# Patient Record
Sex: Female | Born: 1950
Health system: Southern US, Community
[De-identification: ages and names within clinical notes are randomized; demographics above are authoritative.]

## PROBLEM LIST (undated history)

## (undated) DIAGNOSIS — N186 End stage renal disease: Secondary | ICD-10-CM

## (undated) DIAGNOSIS — G4730 Sleep apnea, unspecified: Secondary | ICD-10-CM

## (undated) DIAGNOSIS — E119 Type 2 diabetes mellitus without complications: Secondary | ICD-10-CM

## (undated) DIAGNOSIS — N83209 Unspecified ovarian cyst, unspecified side: Secondary | ICD-10-CM

## (undated) DIAGNOSIS — Z87442 Personal history of urinary calculi: Secondary | ICD-10-CM

## (undated) DIAGNOSIS — N184 Chronic kidney disease, stage 4 (severe): Secondary | ICD-10-CM

## (undated) DIAGNOSIS — I499 Cardiac arrhythmia, unspecified: Secondary | ICD-10-CM

## (undated) DIAGNOSIS — Z992 Dependence on renal dialysis: Secondary | ICD-10-CM

## (undated) DIAGNOSIS — D649 Anemia, unspecified: Secondary | ICD-10-CM

## (undated) DIAGNOSIS — L409 Psoriasis, unspecified: Secondary | ICD-10-CM

## (undated) DIAGNOSIS — I517 Cardiomegaly: Secondary | ICD-10-CM

## (undated) DIAGNOSIS — R011 Cardiac murmur, unspecified: Secondary | ICD-10-CM

## (undated) DIAGNOSIS — M1990 Unspecified osteoarthritis, unspecified site: Secondary | ICD-10-CM

## (undated) DIAGNOSIS — J45909 Unspecified asthma, uncomplicated: Secondary | ICD-10-CM

## (undated) DIAGNOSIS — J189 Pneumonia, unspecified organism: Secondary | ICD-10-CM

## (undated) DIAGNOSIS — Z8679 Personal history of other diseases of the circulatory system: Secondary | ICD-10-CM

## (undated) DIAGNOSIS — K649 Unspecified hemorrhoids: Secondary | ICD-10-CM

## (undated) DIAGNOSIS — T8859XA Other complications of anesthesia, initial encounter: Secondary | ICD-10-CM

## (undated) DIAGNOSIS — R7303 Prediabetes: Secondary | ICD-10-CM

## (undated) DIAGNOSIS — I10 Essential (primary) hypertension: Secondary | ICD-10-CM

## (undated) DIAGNOSIS — R001 Bradycardia, unspecified: Secondary | ICD-10-CM

## (undated) HISTORY — DX: Unspecified ovarian cyst, unspecified side: N83.209

## (undated) HISTORY — DX: Personal history of other diseases of the circulatory system: Z86.79

## (undated) HISTORY — DX: Chronic kidney disease, stage 4 (severe): N18.4

## (undated) HISTORY — PX: JOINT REPLACEMENT: SHX530

## (undated) HISTORY — DX: Cardiac murmur, unspecified: R01.1

## (undated) HISTORY — DX: Unspecified asthma, uncomplicated: J45.909

## (undated) HISTORY — PX: BUNIONECTOMY WITH HAMMERTOE RECONSTRUCTION: SHX5600

## (undated) HISTORY — DX: Unspecified hemorrhoids: K64.9

## (undated) HISTORY — PX: DILATION AND CURETTAGE OF UTERUS: SHX78

---

## 1983-12-23 DIAGNOSIS — N83209 Unspecified ovarian cyst, unspecified side: Secondary | ICD-10-CM

## 1983-12-23 HISTORY — PX: LAPAROSCOPIC OVARIAN CYSTECTOMY: SHX6248

## 1983-12-23 HISTORY — DX: Unspecified ovarian cyst, unspecified side: N83.209

## 2004-12-22 HISTORY — PX: ROTATOR CUFF REPAIR: SHX139

## 2005-12-22 HISTORY — PX: LAPAROSCOPIC GASTRIC BYPASS: SUR771

## 2012-11-10 DIAGNOSIS — N952 Postmenopausal atrophic vaginitis: Secondary | ICD-10-CM | POA: Insufficient documentation

## 2014-11-09 ENCOUNTER — Other Ambulatory Visit: Payer: Self-pay

## 2014-11-09 DIAGNOSIS — Z1231 Encounter for screening mammogram for malignant neoplasm of breast: Secondary | ICD-10-CM

## 2014-11-23 ENCOUNTER — Ambulatory Visit
Admission: RE | Admit: 2014-11-23 | Discharge: 2014-11-23 | Disposition: A | Discharge status: Home / Self Care | Payer: Federal, State, Local not specified - PPO | Source: Ambulatory Visit

## 2014-11-23 DIAGNOSIS — Z1231 Encounter for screening mammogram for malignant neoplasm of breast: Secondary | ICD-10-CM

## 2014-11-30 ENCOUNTER — Ambulatory Visit (INDEPENDENT_AMBULATORY_CARE_PROVIDER_SITE_OTHER): Payer: Federal, State, Local not specified - PPO | Admitting: Nurse Practitioner

## 2014-11-30 ENCOUNTER — Encounter: Payer: Self-pay | Admitting: Nurse Practitioner

## 2014-11-30 ENCOUNTER — Telehealth: Payer: Self-pay | Admitting: Nurse Practitioner

## 2014-11-30 VITALS — BP 120/64 | HR 56 | Ht 62.0 in | Wt 227.0 lb

## 2014-11-30 DIAGNOSIS — Z1211 Encounter for screening for malignant neoplasm of colon: Secondary | ICD-10-CM

## 2014-11-30 DIAGNOSIS — S4992XA Unspecified injury of left shoulder and upper arm, initial encounter: Secondary | ICD-10-CM

## 2014-11-30 DIAGNOSIS — N950 Postmenopausal bleeding: Secondary | ICD-10-CM

## 2014-11-30 DIAGNOSIS — Z01419 Encounter for gynecological examination (general) (routine) without abnormal findings: Secondary | ICD-10-CM

## 2014-11-30 DIAGNOSIS — Z0000 Encounter for general adult medical examination without abnormal findings: Secondary | ICD-10-CM

## 2014-11-30 DIAGNOSIS — R140 Abdominal distension (gaseous): Secondary | ICD-10-CM

## 2014-11-30 NOTE — Telephone Encounter (Signed)
Left message for patient to call back  

## 2014-11-30 NOTE — Telephone Encounter (Signed)
Per conversation with Mrs Colleen Gray: Patient fell at the gym and was injured. Relayed this information to [place] at [employer]. Patient is scheduled Monday, Dec 14 @ [facility] wth Dr Earlie Server.

## 2014-11-30 NOTE — Telephone Encounter (Signed)
Raliegh Ip would like to know if this is a workers comp injury

## 2014-11-30 NOTE — Progress Notes (Signed)
Patient ID: Colleen Gray, female   DOB: [birth year], 63 y.o.   MRN: [id number] 63 y.o. [id number] Widowed African American Fe here for annual exam. Menopausal since 2009. She had irregular bleeding on and off about 6 months prior to menopause.   Rare vaso symptoms. On November 18 th had a clot of blood from the vagina that came without straining.  Then had light menstrual flow for 2 days. Has abdominal cramps like a menstrual cycle.  No vaginal bleeding since.  She is concerned about OV cancer as she is having some GI symptoms of bloating and increase in weight through her abdomen.  She is due for a repeat colonoscopy, but will not see her new PCP until February.  She does from time to time have tinges of blood on the stool.  Thinks this is related to straining.   Husband died 2.75 years ago with complications of esophageal rupture. She does have a significant other.  Previously Chief Strategy Officer and retired 2008.   Moved from [place] in September.     Patient's last menstrual period was 12/23/2007 (approximate).          Sexually active: Yes.    The current method of family planning is post menopausal status.    Exercising: No.  The patient does not participate in regular exercise at present. Smoker:  no  Health Maintenance: Pap:  11/21/13, Negative with neg HR HPV, no history of abnormal MMG:  11/23/14 at [facility], no results at time of visit as they are waiting for films from imaging center in [place]. Colonoscopy:  2010, normal, repeat in 5 years due to family history of colon cancer BMD:   10/25/2012,  T Score:  3.5S/1.6R/1.6L TDaP:  unsure Labs: PCP 10/05/2014 - normal   reports that she has never smoked. She has never used smokeless tobacco. She reports that she does not drink alcohol or use illicit drugs.  Past Medical History  Diagnosis Date  . Heart murmur   . Asthma   . History of rheumatic fever     with a heart murmer  . Ovarian cyst 1985    removed  . Hemorrhoid      Past Surgical History  Procedure Laterality Date  . Bunionectomy Bilateral 2009    with hammertoe surgery  . Rotator cuff repair Right 2006  . Laparoscopic gastric bypass  2007  . Laparoscopic ovarian cystectomy Right 1985  . Dilation and curettage of uterus  early 1980's    for missed AB    Current Outpatient Prescriptions  Medication Sig Dispense Refill  . albuterol (PROAIR HFA) 108 (90 BASE) MCG/ACT inhaler Inhale 2 puffs into the lungs every 4 (four) hours as needed.    Marland Kitchen aspirin 81 MG tablet Take 81 mg by mouth 3 (three) times a week.    . Calcium-Magnesium-Vitamin D (CALCIUM 1200+D3 PO) Take 1 tablet by mouth daily.    . Diclofenac Sodium (PENNSAID) 2 % SOLN Place 1 spray onto the skin as needed.    . EUFLEXXA 20 MG/2ML SOSY 20 mg every 6 (six) months.    Marland Kitchen lisinopril (PRINIVIL,ZESTRIL) 20 MG tablet Take 20 mg by mouth daily.  1  . Lorcaserin HCl (BELVIQ) 10 MG TABS Take 1 tablet by mouth 2 (two) times daily.    . vitamin B-12 (CYANOCOBALAMIN) 100 MCG tablet Take 50 mcg by mouth daily.     No current facility-administered medications for this visit.    Family History  Problem Relation Age of Onset  .  Colon cancer Mother 13  . Diabetes Mother   . Hypertension Mother   . Diabetes Father   . Hypertension Father   . Colon cancer Maternal Grandmother 96  . Diabetes Paternal Grandmother   . Diabetes Paternal Grandfather     ROS:  Pertinent items are noted in HPI.  Otherwise, a comprehensive ROS was negative.  Exam:   BP 120/64 mmHg  Pulse 56  Ht 5\' 2"  ([id number] m)  Wt 227 lb (102.967 kg)  BMI 41.51 kg/m2  LMP 12/23/2007 (Approximate) Height: 5\' 2"  (157.5 cm)  Ht Readings from Last 3 Encounters:  11/30/14 5\' 2"  (1.575 m)    General appearance: alert, cooperative and appears stated age Head: Normocephalic, without obvious abnormality, atraumatic Neck: no adenopathy, supple, symmetrical, trachea midline and thyroid normal to inspection and palpation.   She has an  abnormal left clavicle with a healing fracture.  I believe this came from a previous fall in January at the gym and now calcifications.  She exhibits with a left shoulder drop. Lungs: clear to auscultation bilaterally Breasts: normal appearance, no masses or tenderness Heart: regular rate and rhythm Abdomen: soft, non-tender; no masses,  no organomegaly Extremities: extremities normal, atraumatic, no cyanosis or edema Skin: Skin color, texture, turgor normal. No rashes or lesions Lymph nodes: Cervical, supraclavicular, and axillary nodes normal. No abnormal inguinal nodes palpated Neurologic: Grossly normal   Pelvic: External genitalia:  no lesions              Urethra:  normal appearing urethra with no masses, tenderness or lesions              Bartholin's and Skene's: normal                 Vagina: normal appearing vagina with normal color and discharge, no lesions, no bleeding              Cervix: anteverted              Pap taken: Yes.   Bimanual Exam:  Uterus:  normal size, contour, position, consistency, mobility, non-tender              Adnexa: no mass, fullness, tenderness               Rectovaginal: Confirms               Anus:  normal sphincter tone, no lesions  A:  Well Woman with normal exam  Postmenopausal with PMB 11/08/14 along with abdominal bloating  History of Rheumatic heart disease  S/P right OV Cystectomy 1985  History of hemorrhoid with recent blood on stool   [facility] of colon cancer with Mother and MGM  S/P Gastric bypass 2007 - currently on Belviq  P:   Reviewed health and wellness pertinent to exam  Pap smear taken today  Mammogram is due 12/16  Will follow with CA 125 and pap  Will get PUS, endo biopsy to evaluate PMB  Will get GI referral for blood on stool, routine screening is due and [facility] of colon cancer  Will get Ortho consult for follow up on left clavicle injury  Counseled on breast self exam, mammography screening, osteoporosis, adequate intake of  calcium and vitamin D, diet and exercise, Kegel's exercises return annually or prn  An After Visit Summary was printed and given to the patient.

## 2014-11-30 NOTE — Patient Instructions (Signed)

## 2014-11-30 NOTE — Telephone Encounter (Signed)
-----   Message from Elenore Paddy sent at 11/30/2014  2:13 PM EST ----- Contact: [phone] Cindy with [employer] Ip left voicemail during lunch hours asking for call back regarding Dwain Sarna.  Best: [phone]  Thanks  becky

## 2014-12-01 LAB — CA 125: CA 125: 7 U/mL (ref <35)

## 2014-12-02 LAB — IPS PAP TEST WITH HPV

## 2014-12-03 NOTE — Progress Notes (Signed)
Encounter reviewed by Dr. Brook Silva.  

## 2014-12-04 ENCOUNTER — Other Ambulatory Visit: Payer: Self-pay | Admitting: Orthopedic Surgery

## 2014-12-04 DIAGNOSIS — S43202D Unspecified subluxation of left sternoclavicular joint, subsequent encounter: Secondary | ICD-10-CM

## 2014-12-05 ENCOUNTER — Other Ambulatory Visit: Payer: Self-pay

## 2014-12-05 DIAGNOSIS — N950 Postmenopausal bleeding: Secondary | ICD-10-CM

## 2014-12-05 DIAGNOSIS — R140 Abdominal distension (gaseous): Secondary | ICD-10-CM

## 2014-12-05 NOTE — Telephone Encounter (Signed)
Spoke with patient. Advised that per benefit quote received, she will be responsible for $58.88 when she comes in for PUS. Patient agreeable. Scheduled PUS. Advised patient of 72 hour cancellation policy and [id number] cancellation fee. Patient agreeable.

## 2014-12-07 ENCOUNTER — Ambulatory Visit
Admission: RE | Admit: 2014-12-07 | Discharge: 2014-12-07 | Disposition: A | Discharge status: Home / Self Care | Payer: Federal, State, Local not specified - PPO | Source: Ambulatory Visit | Attending: Orthopedic Surgery | Admitting: Orthopedic Surgery

## 2014-12-07 DIAGNOSIS — S43202D Unspecified subluxation of left sternoclavicular joint, subsequent encounter: Secondary | ICD-10-CM

## 2014-12-21 ENCOUNTER — Ambulatory Visit (INDEPENDENT_AMBULATORY_CARE_PROVIDER_SITE_OTHER): Payer: Federal, State, Local not specified - PPO | Admitting: Obstetrics and Gynecology

## 2014-12-21 ENCOUNTER — Encounter: Payer: Self-pay | Admitting: Obstetrics and Gynecology

## 2014-12-21 ENCOUNTER — Ambulatory Visit (INDEPENDENT_AMBULATORY_CARE_PROVIDER_SITE_OTHER): Payer: Federal, State, Local not specified - PPO

## 2014-12-21 VITALS — BP 122/74 | HR 60 | Ht 62.0 in | Wt 226.0 lb

## 2014-12-21 DIAGNOSIS — N950 Postmenopausal bleeding: Secondary | ICD-10-CM

## 2014-12-21 DIAGNOSIS — R140 Abdominal distension (gaseous): Secondary | ICD-10-CM

## 2014-12-21 DIAGNOSIS — D259 Leiomyoma of uterus, unspecified: Secondary | ICD-10-CM

## 2014-12-21 NOTE — Progress Notes (Signed)
Subjective  Patient is here today for pelvic ultrasound and EMB for postmenopausal bleeding.  LMP 2009.  Never took HRT.  Had bleeding in November 18 from vagina.   Also having evaluation for rectal bleeding.  Will be seeing GI in January 04, 2015 - Dr. Collene Mares.   Objective  See report and images below.  Uterus with multiple small fibroids.  EMS 3.12 mm Ovaries normal.   No free fluid.     Attempted EMB. Consent for procedure.  Speculum placed.  No cervical lesions.  Patient intolerant of speculum. Inable to place tenaculum or give paracervical block.   Bimanual - uterus small and nontender.  Exam limited by [facility]. No adnexal masses.  Tight hymen.   Assessment  Multifibroid uterus.  Postmenopausal bleeding. Unable to do office EMB.  Intolerance to speculum exam.   Plan   Patient will inform office if she has recurrent postmenopausal bleeding.  If that occurs, hospital dilation and curettage.  25 minutes face to face time of which over 50% was spent in counseling.   After visit summary to patient.

## 2014-12-22 HISTORY — PX: COLONOSCOPY: SHX174

## 2015-02-09 ENCOUNTER — Emergency Department (HOSPITAL_COMMUNITY): Payer: Federal, State, Local not specified - PPO

## 2015-02-09 ENCOUNTER — Emergency Department (HOSPITAL_COMMUNITY)
Admission: EM | Admit: 2015-02-09 | Discharge: 2015-02-09 | Disposition: A | Discharge status: Home / Self Care | Payer: Federal, State, Local not specified - PPO | Attending: Emergency Medicine | Admitting: Emergency Medicine

## 2015-02-09 ENCOUNTER — Encounter (HOSPITAL_COMMUNITY): Payer: Self-pay | Admitting: *Deleted

## 2015-02-09 DIAGNOSIS — R011 Cardiac murmur, unspecified: Secondary | ICD-10-CM | POA: Insufficient documentation

## 2015-02-09 DIAGNOSIS — Z7982 Long term (current) use of aspirin: Secondary | ICD-10-CM | POA: Diagnosis not present

## 2015-02-09 DIAGNOSIS — Z791 Long term (current) use of non-steroidal anti-inflammatories (NSAID): Secondary | ICD-10-CM | POA: Diagnosis not present

## 2015-02-09 DIAGNOSIS — R001 Bradycardia, unspecified: Secondary | ICD-10-CM | POA: Insufficient documentation

## 2015-02-09 DIAGNOSIS — Z79899 Other long term (current) drug therapy: Secondary | ICD-10-CM | POA: Insufficient documentation

## 2015-02-09 DIAGNOSIS — J45909 Unspecified asthma, uncomplicated: Secondary | ICD-10-CM | POA: Insufficient documentation

## 2015-02-09 DIAGNOSIS — Z8742 Personal history of other diseases of the female genital tract: Secondary | ICD-10-CM | POA: Diagnosis not present

## 2015-02-09 DIAGNOSIS — Z8619 Personal history of other infectious and parasitic diseases: Secondary | ICD-10-CM | POA: Diagnosis not present

## 2015-02-09 LAB — BASIC METABOLIC PANEL
Anion gap: 9 (ref 5–15)
BUN: 18 mg/dL (ref 6–23)
CO2: 20 mmol/L (ref 19–32)
Calcium: 9.3 mg/dL (ref 8.4–10.5)
Chloride: 114 mmol/L — ABNORMAL HIGH (ref 96–112)
Creatinine, Ser: 1.43 mg/dL — ABNORMAL HIGH (ref 0.50–1.10)
GFR calc Af Amer: 44 mL/min — ABNORMAL LOW (ref >90)
GFR calc non Af Amer: 38 mL/min — ABNORMAL LOW (ref >90)
Glucose, Bld: 58 mg/dL — ABNORMAL LOW (ref 70–99)
Potassium: 3.6 mmol/L (ref 3.5–5.1)
Sodium: 143 mmol/L (ref 135–145)

## 2015-02-09 LAB — CBC
HCT: 37.1 % (ref 36.0–46.0)
Hemoglobin: 12.1 g/dL (ref 12.0–15.0)
MCH: 27.9 pg (ref 26.0–34.0)
MCHC: 32.6 g/dL (ref 30.0–36.0)
MCV: 85.7 fL (ref 78.0–100.0)
Platelets: 252 10*3/uL (ref 150–400)
RBC: 4.33 MIL/uL (ref 3.87–5.11)
RDW: 14.6 % (ref 11.5–15.5)
WBC: 4.8 10*3/uL (ref 4.0–10.5)

## 2015-02-09 LAB — TROPONIN I: Troponin I: <0.03 ng/mL (ref <0.031)

## 2015-02-09 NOTE — ED Notes (Signed)
Dr. Collene Mares called and states that pt is being sent for bradycardia and that Dr. Terrence Dupont will admit if necessary.

## 2015-02-09 NOTE — ED Notes (Signed)
The pt went to get a colonoscopy this am and her heart rate was too low tio have the procedure.  It was a routine exam 5 yr.  The pt has no chest pain.   She feel fine otherwise.  Her heart rate was  In the 40s.

## 2015-02-09 NOTE — ED Provider Notes (Signed)
CSN: [id number]     Arrival date & time 02/09/15  1602 History   First MD Initiated Contact with Patient 02/09/15 2004     Chief Complaint  Patient presents with  . Bradycardia     (Consider location/radiation/quality/duration/timing/severity/associated sxs/prior Treatment) Patient is a 64 y.o. female presenting with general illness.  Illness Quality:  Bradycardia, asymptomatic Severity:  Moderate Onset quality:  Unable to specify Timing:  Constant Progression:  Resolved Chronicity:  New Context:  Noted while being preopped for colonoscopy this am Relieved by:  Nothing Worsened by:  Nothing Associated symptoms: no abdominal pain, no chest pain, no cough, no loss of consciousness, no nausea, no shortness of breath and no vomiting     Past Medical History  Diagnosis Date  . Heart murmur   . Asthma   . History of rheumatic fever     with a heart murmer  . Ovarian cyst 1985    removed  . Hemorrhoid    Past Surgical History  Procedure Laterality Date  . Bunionectomy Bilateral 2009    with hammertoe surgery  . Rotator cuff repair Right 2006  . Laparoscopic gastric bypass  2007  . Laparoscopic ovarian cystectomy Right 1985  . Dilation and curettage of uterus  early 1980's    for missed AB   Family History  Problem Relation Age of Onset  . Colon cancer Mother 21  . Diabetes Mother   . Hypertension Mother   . Diabetes Father   . Hypertension Father   . Colon cancer Maternal Grandmother 96  . Diabetes Paternal Grandmother   . Diabetes Paternal Grandfather    History  Substance Use Topics  . Smoking status: Never Smoker   . Smokeless tobacco: Never Used  . Alcohol Use: No   OB History    Gravida Para Term Preterm AB TAB SAB Ectopic Multiple Living   1    1  1    0     Review of Systems  Respiratory: Negative for cough and shortness of breath.   Cardiovascular: Negative for chest pain.  Gastrointestinal: Negative for nausea, vomiting and abdominal pain.   Neurological: Negative for loss of consciousness.  All other systems reviewed and are negative.     Allergies  Penicillins  Home Medications   Prior to Admission medications   Medication Sig Start Date End Date Taking? Authorizing Provider  albuterol (PROAIR HFA) 108 (90 BASE) MCG/ACT inhaler Inhale 2 puffs into the lungs every 4 (four) hours as needed for shortness of breath.  10/22/13  Yes Historical Provider, MD  Alpha-Lipoic Acid 200 MG TABS Take 1 tablet by mouth daily.   Yes Historical Provider, MD  aspirin 81 MG tablet Take 81 mg by mouth 3 (three) times a week. No specific days   Yes Historical Provider, MD  Calcium-Magnesium-Vitamin D (CALCIUM 1200+D3 PO) Take 1 tablet by mouth daily.   Yes Historical Provider, MD  Diclofenac Sodium (PENNSAID) 2 % SOLN Place 1 spray onto the skin daily as needed. For the shoulder   Yes Historical Provider, MD  EUFLEXXA 20 MG/2ML SOSY 20 mg every 6 (six) months. Last dose was in December 2015 11/20/14  Yes Historical Provider, MD  lisinopril (PRINIVIL,ZESTRIL) 20 MG tablet Take 20 mg by mouth daily. 11/20/14  Yes Historical Provider, MD  Lorcaserin HCl (BELVIQ) 10 MG TABS Take 1 tablet by mouth 2 (two) times daily.   Yes Historical Provider, MD  Multiple Vitamin (MULTIVITAMIN WITH MINERALS) TABS tablet Take 1 tablet by mouth  daily.   Yes Historical Provider, MD  omega-3 acid ethyl esters (LOVAZA) 1 G capsule Take 1 g by mouth 2 (two) times daily.   Yes Historical Provider, MD  vitamin B-12 (CYANOCOBALAMIN) 100 MCG tablet Take 50 mcg by mouth daily.   Yes Historical Provider, MD  vitamin C (ASCORBIC ACID) 500 MG tablet Take 500 mg by mouth daily.   Yes Historical Provider, MD   BP 125/62 mmHg  Pulse 69  Temp(Src) 97.5 F (36.4 C) (Oral)  Resp 17  SpO2 97%  LMP 12/23/2007 (Approximate) Physical Exam  Constitutional: She is oriented to person, place, and time. She appears well-developed and well-nourished.  HENT:  Head: Normocephalic and  atraumatic.  Right Ear: External ear normal.  Left Ear: External ear normal.  Eyes: Conjunctivae and EOM are normal. Pupils are equal, round, and reactive to light.  Neck: Normal range of motion. Neck supple.  Cardiovascular: Normal rate, regular rhythm, normal heart sounds and intact distal pulses.   Pulmonary/Chest: Effort normal and breath sounds normal.  Abdominal: Soft. Bowel sounds are normal. There is no tenderness.  Musculoskeletal: Normal range of motion.  Neurological: She is alert and oriented to person, place, and time.  Skin: Skin is warm and dry.  Vitals reviewed.   ED Course  Procedures (including critical care time) Labs Review Labs Reviewed  BASIC METABOLIC PANEL - Abnormal; Notable for the following:    Chloride 114 (*)    Glucose, Bld 58 (*)    Creatinine, Ser 1.43 (*)    GFR calc non Af Amer 38 (*)    GFR calc Af Amer 44 (*)    All other components within normal limits  CBC  TROPONIN I    Imaging Review Dg Chest 2 View  02/09/2015   CLINICAL DATA:  The pt went to get a colonoscopy this am and her heart rate was too low to have the procedure. It was a routine exam 5 yr. The pt has no chest pain. She feel fine otherwise. Her heart rate was In the 40s. Hx heart murmur and enlarged heart when she was 64 y.o. Pt is not having any other symptoms  EXAM: CHEST  2 VIEW  COMPARISON:  Chest CT, 02/07/2014  FINDINGS: Cardiac silhouette is mildly enlarged.  Normal mediastinal and hilar contours.  Clear lungs.  No pleural effusion or pneumothorax.  Bony thorax is demineralized. There arthropathic changes of both shoulders.  IMPRESSION: No acute cardiopulmonary disease.   Electronically Signed   By: Lajean Manes M.D.   On: 02/09/2015 18:17     EKG Interpretation None      MDM   Final diagnoses:  None    64 y.o. female with pertinent PMH of rheumatic fever presents with asymptomatic bradycardia noted just prior to colonoscopy today. Patient denies chest pain, near  syncope, lightheadedness, shortness of breath, other symptoms. On arrival today vitals signs and physical exam as above. Heart rate improved. Unknown etiology, however feel patient stable for outpatient cardiology evaluation. Discharged home in stable condition..    I have reviewed all laboratory and imaging studies if ordered as above  1. Bradycardia         Colleen Freiberg, MD 02/09/15 2019

## 2015-02-09 NOTE — Discharge Instructions (Signed)
Bradycardia °Bradycardia is a term for a heart rate (pulse) that, in adults, is slower than 60 beats per minute. A normal rate is 60 to 100 beats per minute. A heart rate below 60 beats per minute may be normal for some adults with healthy hearts. If the rate is too slow, the heart may have trouble pumping the volume of blood the body needs. If the heart rate gets too low, blood flow to the brain may be decreased and may make you feel lightheaded, dizzy, or faint. °The heart has a natural pacemaker in the top of the heart called the SA node (sinoatrial or sinus node). This pacemaker sends out regular electrical signals to the muscle of the heart, telling the heart muscle when to beat (contract). The electrical signal travels from the upper parts of the heart (atria) through the AV node (atrioventricular node), to the lower chambers of the heart (ventricles). The ventricles squeeze, pumping the blood from your heart to your lungs and to the rest of your body. °CAUSES  °· Problem with the heart's electrical system. °· Problem with the heart's natural pacemaker. °· Heart disease, damage, or infection. °· Medications. °· Problems with minerals and salts (electrolytes). °SYMPTOMS  °· Fainting (syncope). °· Fatigue and weakness. °· Shortness of breath (dyspnea). °· Chest pain (angina). °· Drowsiness. °· Confusion. °DIAGNOSIS  °· An electrocardiogram (ECG) can help your caregiver determine the type of slow heart rate you have. °· If the cause is not seen on an ECG, you may need to wear a heart monitor that records your heart rhythm for several hours or days. °· Blood tests. °TREATMENT  °· Electrolyte supplements. °· Medications. °· Withholding medication which is causing a slow heart rate. °· Pacemaker placement. °SEEK IMMEDIATE MEDICAL CARE IF:  °· You feel lightheaded or faint. °· You develop an irregular heart rate. °· You feel chest pain or have trouble breathing. °MAKE SURE YOU:  °· Understand these  instructions. °· Will watch your condition. °· Will get help right away if you are not doing well or get worse. °Document Released: 08/30/2002 Document Revised: 03/01/2012 Document Reviewed: 03/15/2014 °ExitCare® Patient Information ©2015 [employer]. This information is not intended to replace advice given to you by your health care provider. Make sure you discuss any questions you have with your health care provider. ° °

## 2015-04-06 ENCOUNTER — Telehealth: Payer: Self-pay | Admitting: Nurse Practitioner

## 2015-04-06 NOTE — Telephone Encounter (Signed)
Patient calling requesting a refill on her birth control. Pharmacy on file is correct. °

## 2015-07-20 ENCOUNTER — Encounter: Payer: Self-pay | Admitting: Nurse Practitioner

## 2015-08-31 ENCOUNTER — Encounter: Payer: Self-pay | Admitting: Podiatry

## 2015-08-31 ENCOUNTER — Ambulatory Visit (INDEPENDENT_AMBULATORY_CARE_PROVIDER_SITE_OTHER): Payer: Federal, State, Local not specified - PPO | Admitting: Podiatry

## 2015-08-31 VITALS — BP 124/73 | HR 62 | Resp 16

## 2015-08-31 DIAGNOSIS — M2042 Other hammer toe(s) (acquired), left foot: Secondary | ICD-10-CM | POA: Diagnosis not present

## 2015-08-31 DIAGNOSIS — M79672 Pain in left foot: Secondary | ICD-10-CM

## 2015-08-31 DIAGNOSIS — M21962 Unspecified acquired deformity of left lower leg: Secondary | ICD-10-CM

## 2015-08-31 NOTE — Progress Notes (Signed)
   Subjective:    Patient ID: Colleen Gray, female    DOB: April 04, 1951, 64 y.o.   MRN: [id number]  HPI 64 year old female presents the office today for surgical consultation as a referral from Dr. Gershon Mussel. She states that she previously had first MTPJ fusion of the left foot approximate 9 years ago. She states that recently she has redness or second, third, fourth toes drifting outwards and become contracted. She has pain in the toes because pressure in shoe gear. She describes as a throbbing ongoing pain. She has tried multiple conservative treatments including shoe gear modifications, offloading, padding without much relief in symptoms. At this time she is requesting surgical intervention.  Review of Systems  Musculoskeletal: Positive for arthralgias and gait problem.  All other systems reviewed and are negative.      Objective:   Physical Exam AAO 3, NAD DP/PT pulses palpable, CRT less than 3 seconds Protective sensation intact with Derrel Nip monofilament His been previous fusion first MTPJ of the left foot which appears to be stable. The first metatarsal hallux in a rectus position. The lesser digits are laterally deviated and there are hammertoe contractures. There is tenderness overlying the MPJs lesser digits as well as the dorsal PIPJ's. There is no overlying edema, erythema, increase in warmth. There is a decrease in medial arch height upon weightbearing and mild discomfort on the medial arch. There is no other areas of pinpoint bony tenderness or pain the vibratory sensation. MMT 5/5, ROM WNL except for otherwise stated.  No open lesions or pre-ulcerative lesions. No pain with calf compression, swelling, warmth, erythema.      Assessment & Plan:   64 year old female with lesser digital deformities left foot. -She did not bring x-rays with her from Dr. Gershon Mussel and she did not want new fractures performed. I will get a copy of the x-rays and review them and give her a call once I  review them. -Based on the report from Dr. Gershon Mussel there is degeneration of the metatarsal heads. Due to that and the digital deformities would likely benefit from a pain metatarsal head resection and hammertoe repair however will await the results of the x-rays before deciding on definitive treatment. -She will likely benefit from orthotics to help her medial arch however we will hold off on that given her upcoming surgery most likely. -Follow-up after receiving the x-rays or sooner if any problems are to arise. In the meantime I encouraged her to call the office with any questions, concerns, change in symptoms.  Celesta Gentile, DPM

## 2015-09-19 ENCOUNTER — Other Ambulatory Visit: Payer: Self-pay

## 2015-09-19 ENCOUNTER — Telehealth: Payer: Self-pay | Admitting: Nurse Practitioner

## 2015-09-19 DIAGNOSIS — E2839 Other primary ovarian failure: Secondary | ICD-10-CM

## 2015-09-19 DIAGNOSIS — Z780 Asymptomatic menopausal state: Secondary | ICD-10-CM

## 2015-09-19 NOTE — Telephone Encounter (Signed)
Patient calling requesting an order be sent to [facility] for her bone density. She said, "I am due after 11/26/15 and would like to have that at the same time as my mammogram."

## 2015-09-19 NOTE — Telephone Encounter (Signed)
Order for BMD sent to [facility]. Left message for patient to call [place] at [phone].

## 2015-09-20 ENCOUNTER — Other Ambulatory Visit: Payer: Self-pay

## 2015-09-20 DIAGNOSIS — E2839 Other primary ovarian failure: Secondary | ICD-10-CM

## 2015-09-20 DIAGNOSIS — Z780 Asymptomatic menopausal state: Secondary | ICD-10-CM

## 2015-09-20 NOTE — Telephone Encounter (Signed)
Spoke with patient. Advised patient order for BMD has been sent to the [facility]. Patient is agreeable and will call to schedule appointment.  Routing to provider for final review. Patient agreeable to disposition. Will close encounter.

## 2015-11-19 ENCOUNTER — Encounter (HOSPITAL_COMMUNITY)
Admission: RE | Admit: 2015-11-19 | Discharge: 2015-11-19 | Disposition: A | Discharge status: Home / Self Care | Payer: Federal, State, Local not specified - PPO | Source: Ambulatory Visit | Attending: Orthopedic Surgery | Admitting: Orthopedic Surgery

## 2015-11-19 ENCOUNTER — Other Ambulatory Visit (HOSPITAL_COMMUNITY): Payer: Self-pay | Admitting: *Deleted

## 2015-11-19 ENCOUNTER — Encounter (HOSPITAL_COMMUNITY): Payer: Self-pay

## 2015-11-19 DIAGNOSIS — Z8719 Personal history of other diseases of the digestive system: Secondary | ICD-10-CM | POA: Diagnosis not present

## 2015-11-19 DIAGNOSIS — G4733 Obstructive sleep apnea (adult) (pediatric): Secondary | ICD-10-CM | POA: Diagnosis not present

## 2015-11-19 DIAGNOSIS — Z9884 Bariatric surgery status: Secondary | ICD-10-CM | POA: Diagnosis not present

## 2015-11-19 DIAGNOSIS — I10 Essential (primary) hypertension: Secondary | ICD-10-CM | POA: Diagnosis not present

## 2015-11-19 DIAGNOSIS — Z880 Allergy status to penicillin: Secondary | ICD-10-CM | POA: Diagnosis not present

## 2015-11-19 DIAGNOSIS — M75122 Complete rotator cuff tear or rupture of left shoulder, not specified as traumatic: Secondary | ICD-10-CM | POA: Diagnosis not present

## 2015-11-19 DIAGNOSIS — J45909 Unspecified asthma, uncomplicated: Secondary | ICD-10-CM | POA: Diagnosis not present

## 2015-11-19 DIAGNOSIS — R011 Cardiac murmur, unspecified: Secondary | ICD-10-CM | POA: Diagnosis not present

## 2015-11-19 DIAGNOSIS — Z7982 Long term (current) use of aspirin: Secondary | ICD-10-CM | POA: Diagnosis not present

## 2015-11-19 DIAGNOSIS — Z79899 Other long term (current) drug therapy: Secondary | ICD-10-CM | POA: Diagnosis not present

## 2015-11-19 DIAGNOSIS — M19012 Primary osteoarthritis, left shoulder: Secondary | ICD-10-CM | POA: Diagnosis not present

## 2015-11-19 DIAGNOSIS — Z6841 Body Mass Index (BMI) 40.0 and over, adult: Secondary | ICD-10-CM | POA: Diagnosis not present

## 2015-11-19 HISTORY — DX: Essential (primary) hypertension: I10

## 2015-11-19 HISTORY — DX: Unspecified osteoarthritis, unspecified site: M19.90

## 2015-11-19 HISTORY — DX: Pneumonia, unspecified organism: J18.9

## 2015-11-19 HISTORY — DX: Anemia, unspecified: D64.9

## 2015-11-19 HISTORY — DX: Cardiomegaly: I51.7

## 2015-11-19 HISTORY — DX: Sleep apnea, unspecified: G47.30

## 2015-11-19 HISTORY — DX: Bradycardia, unspecified: R00.1

## 2015-11-19 LAB — CBC WITH DIFFERENTIAL/PLATELET
Basophils Absolute: 0 K/uL (ref 0.0–0.1)
Basophils Relative: 0 %
Eosinophils Absolute: 0.1 K/uL (ref 0.0–0.7)
Eosinophils Relative: 2 %
HCT: 39.1 % (ref 36.0–46.0)
Hemoglobin: 12.6 g/dL (ref 12.0–15.0)
Lymphocytes Relative: 34 %
Lymphs Abs: 1.6 K/uL (ref 0.7–4.0)
MCH: 29.2 pg (ref 26.0–34.0)
MCHC: 32.2 g/dL (ref 30.0–36.0)
MCV: 90.7 fL (ref 78.0–100.0)
Monocytes Absolute: 0.3 K/uL (ref 0.1–1.0)
Monocytes Relative: 7 %
Neutro Abs: 2.6 K/uL (ref 1.7–7.7)
Neutrophils Relative %: 57 %
Platelets: 223 K/uL (ref 150–400)
RBC: 4.31 MIL/uL (ref 3.87–5.11)
RDW: 14.5 % (ref 11.5–15.5)
WBC: 4.6 K/uL (ref 4.0–10.5)

## 2015-11-19 LAB — COMPREHENSIVE METABOLIC PANEL
ALT: 18 U/L (ref 14–54)
AST: 21 U/L (ref 15–41)
Albumin: 3.6 g/dL (ref 3.5–5.0)
Alkaline Phosphatase: 116 U/L (ref 38–126)
Anion gap: 5 (ref 5–15)
BUN: 26 mg/dL — ABNORMAL HIGH (ref 6–20)
CO2: 26 mmol/L (ref 22–32)
Calcium: 8.8 mg/dL — ABNORMAL LOW (ref 8.9–10.3)
Chloride: 112 mmol/L — ABNORMAL HIGH (ref 101–111)
Creatinine, Ser: 1.5 mg/dL — ABNORMAL HIGH (ref 0.44–1.00)
GFR calc Af Amer: 42 mL/min — ABNORMAL LOW (ref >60)
GFR calc non Af Amer: 36 mL/min — ABNORMAL LOW (ref >60)
Glucose, Bld: 117 mg/dL — ABNORMAL HIGH (ref 65–99)
Potassium: 4.3 mmol/L (ref 3.5–5.1)
Sodium: 143 mmol/L (ref 135–145)
Total Bilirubin: 0.5 mg/dL (ref 0.3–1.2)
Total Protein: 6.3 g/dL — ABNORMAL LOW (ref 6.5–8.1)

## 2015-11-19 LAB — APTT: aPTT: 29 s (ref 24–37)

## 2015-11-19 LAB — SURGICAL PCR SCREEN
MRSA, PCR: NEGATIVE
Staphylococcus aureus: NEGATIVE

## 2015-11-19 LAB — PROTIME-INR
INR: 1.05 (ref 0.00–1.49)
Prothrombin Time: 13.9 seconds (ref 11.6–15.2)

## 2015-11-19 NOTE — Pre-Procedure Instructions (Signed)
Colleen Gray  11/19/2015     Your procedure is scheduled on Thursday, November 22, 2015 at 7:30 AM.   Report to [facility] Entrance "A" Admitting Office at 5:30 AM.   Call this number if you have problems the morning of surgery: [phone]   Any questions prior to day of surgery, please call [phone] between 8 & 4 PM.   Remember:  Do not eat food or drink liquids after midnight Wednesday, 11/21/15.   You may use your Albuterol inhaler if needed the morning of surgery.  Stop Aspirin, Multivitamins, Lovaza, and Herbal Medications as of today.     Do not wear jewelry, make-up or nail polish.  Do not wear lotions, powders, or perfumes.  You may NOT wear deodorant.  Do not shave 48 hours prior to surgery.    Do not bring valuables to the hospital.  [facility] is not responsible for any belongings or valuables.  Contacts, dentures or bridgework may not be worn into surgery.  Leave your suitcase in the car.  After surgery it may be brought to your room.  For patients admitted to the hospital, discharge time will be determined by your treatment team.  Special instructions:  [facility] - Preparing for Surgery  Before surgery, you can play an important role.  Because skin is not sterile, your skin needs to be as free of germs as possible.  You can reduce the number of germs on you skin by washing with CHG (chlorahexidine gluconate) soap before surgery.  CHG is an antiseptic cleaner which kills germs and bonds with the skin to continue killing germs even after washing.  Please DO NOT use if you have an allergy to CHG or antibacterial soaps.  If your skin becomes reddened/irritated stop using the CHG and inform your nurse when you arrive at Short Stay.  Do not shave (including legs and underarms) for at least 48 hours prior to the first CHG shower.  You may shave your face.  Please follow these instructions carefully:   1.  Shower with CHG Soap the night before surgery  and the                                morning of Surgery.  2.  If you choose to wash your hair, wash your hair first as usual with your       normal shampoo.  3.  After you shampoo, rinse your hair and body thoroughly to remove the                      Shampoo.  4.  Use CHG as you would any other liquid soap.  You can apply chg directly       to the skin and wash gently with scrungie or a clean washcloth.  5.  Apply the CHG Soap to your body ONLY FROM THE NECK DOWN.        Do not use on open wounds or open sores.  Avoid contact with your eyes, ears, mouth and genitals (private parts).  Wash genitals (private parts) with your normal soap.  6.  Wash thoroughly, paying special attention to the area where your surgery        will be performed.  7.  Thoroughly rinse your body with warm water from the neck down.  8.  DO NOT shower/wash with your normal soap after using  and rinsing off       the CHG Soap.  9.  Pat yourself dry with a clean towel.            10.  Wear clean pajamas.            11.  Place clean sheets on your bed the night of your first shower and do not        sleep with pets.  Day of Surgery  Do not apply any lotions/deodorants the morning of surgery.  Please wear clean clothes to the hospital.   Please read over the following fact sheets that you were given. Pain Booklet, Coughing and Deep Breathing and Surgical Site Infection Prevention

## 2015-11-19 NOTE — Progress Notes (Signed)
Pt has history of a heart murmur (due to rheumatic fever as a child), enlarged heart and bradycardia. She is followed by Dr. Einar Gip, cardiologist.   Stress test 03/02/15 - in chart  EKG - 10/26/15 - in chart  Medical clearance by Dr. Tye Savoy in chart.

## 2015-11-20 NOTE — Progress Notes (Signed)
Anesthesia Chart Review:  Pt is 64 year old female scheduled for L reverse shoulder arthroplasty on 11/22/2015 with Dr. Onnie Graham.   PMH includes:  HTN, bradycardia, enlarged heart, rheumatic fever (with heart murmur), OSA (no longer needs sleep apnea after weight loss), asthma, anemia.  Never smoker. BMI 45.   Medications include: albuterol, ASA, euflexxa, lisinopril, lorcaserin.   Preoperative labs reviewed.    Chest x-ray 02/09/15 reviewed. No acute cardiopulmonary disease.   EKG 02/09/15: NSR. Possible RVH. Possible Lateral infarct, age undetermined. ST & T wave abnormality, consider inferior ischemia  Echo 03/13/15:  1. LV cavity is at upper limits of normal in size. Mild concentric LVH. EF 61% 2. LA and RA slightly dilated.  3. Mild aortic valve leaflet thickening. Peak aortic velocity is borderline increased, may be due to trace aortic valve sclerosis. No AR noted.  4. Mild MR 5. Mild TR. No evidence pulmonary HTN.  6. Trace pericardial effusion vs epicardial fat.   Nuclear stress test 03/02/15:  1. Resting EKG demonstrated NSR, RBBB, and no resting arrhythmias. Stress EKG is non-diagnostic for ischemia.  2. Perfusion imaging study demonstrates mild breast attenuation artifact in the anterior wall without demonstrable ischemia. Normal wall motion, LVEF calculated at 78%. Low risk study.   Pt saw Dr. Einar Gip with cardiology last spring for abnormal EKG and bradycardia. No concerning findings identified. Pt to follow prn.   If no changes, I anticipate pt can proceed with surgery as scheduled.   Willeen Cass, FNP-BC [facility]/Anesthesiology Phone: [phone] 11/20/2015 3:12 PM

## 2015-11-21 MED ORDER — TRANEXAMIC ACID 1000 MG/10ML IV SOLN
1000.0000 mg | INTRAVENOUS | Status: AC
  Administered 2015-11-22: 1000 mg via INTRAVENOUS
  Filled 2015-11-21: qty 10

## 2015-11-21 MED ORDER — CHLORHEXIDINE GLUCONATE 4 % EX LIQD: 60.0000 mL | Freq: Once | CUTANEOUS | Status: DC

## 2015-11-21 MED ORDER — LACTATED RINGERS IV SOLN
INTRAVENOUS | Status: DC
  Administered 2015-11-22 (×2): via INTRAVENOUS

## 2015-11-21 MED ORDER — CEFAZOLIN SODIUM-DEXTROSE 2-3 GM-% IV SOLR
2.0000 g | INTRAVENOUS | Status: AC
  Administered 2015-11-22: 2 g via INTRAVENOUS
  Filled 2015-11-21: qty 50

## 2015-11-22 ENCOUNTER — Ambulatory Visit (HOSPITAL_COMMUNITY): Payer: Federal, State, Local not specified - PPO | Admitting: Emergency Medicine

## 2015-11-22 ENCOUNTER — Encounter (HOSPITAL_COMMUNITY)
Admission: RE | Disposition: A | Discharge status: Home / Self Care | Payer: Self-pay | Source: Ambulatory Visit | Attending: Orthopedic Surgery

## 2015-11-22 ENCOUNTER — Encounter (HOSPITAL_COMMUNITY): Payer: Self-pay | Admitting: *Deleted

## 2015-11-22 ENCOUNTER — Observation Stay (HOSPITAL_COMMUNITY)
Admission: RE | Admit: 2015-11-22 | Discharge: 2015-11-24 | Disposition: A | Discharge status: Home / Self Care | Payer: Federal, State, Local not specified - PPO | Source: Ambulatory Visit | Attending: Orthopedic Surgery | Admitting: Orthopedic Surgery

## 2015-11-22 ENCOUNTER — Ambulatory Visit (HOSPITAL_COMMUNITY): Payer: Federal, State, Local not specified - PPO | Admitting: Anesthesiology

## 2015-11-22 DIAGNOSIS — Z880 Allergy status to penicillin: Secondary | ICD-10-CM | POA: Insufficient documentation

## 2015-11-22 DIAGNOSIS — M19012 Primary osteoarthritis, left shoulder: Principal | ICD-10-CM | POA: Insufficient documentation

## 2015-11-22 DIAGNOSIS — Z8719 Personal history of other diseases of the digestive system: Secondary | ICD-10-CM | POA: Insufficient documentation

## 2015-11-22 DIAGNOSIS — Z96619 Presence of unspecified artificial shoulder joint: Secondary | ICD-10-CM

## 2015-11-22 DIAGNOSIS — I10 Essential (primary) hypertension: Secondary | ICD-10-CM | POA: Insufficient documentation

## 2015-11-22 DIAGNOSIS — M75122 Complete rotator cuff tear or rupture of left shoulder, not specified as traumatic: Secondary | ICD-10-CM | POA: Insufficient documentation

## 2015-11-22 DIAGNOSIS — J45909 Unspecified asthma, uncomplicated: Secondary | ICD-10-CM | POA: Insufficient documentation

## 2015-11-22 DIAGNOSIS — G4733 Obstructive sleep apnea (adult) (pediatric): Secondary | ICD-10-CM | POA: Insufficient documentation

## 2015-11-22 DIAGNOSIS — Z6841 Body Mass Index (BMI) 40.0 and over, adult: Secondary | ICD-10-CM | POA: Insufficient documentation

## 2015-11-22 DIAGNOSIS — Z9884 Bariatric surgery status: Secondary | ICD-10-CM | POA: Insufficient documentation

## 2015-11-22 DIAGNOSIS — Z7982 Long term (current) use of aspirin: Secondary | ICD-10-CM | POA: Insufficient documentation

## 2015-11-22 DIAGNOSIS — R011 Cardiac murmur, unspecified: Secondary | ICD-10-CM | POA: Insufficient documentation

## 2015-11-22 DIAGNOSIS — Z79899 Other long term (current) drug therapy: Secondary | ICD-10-CM | POA: Insufficient documentation

## 2015-11-22 HISTORY — PX: REVERSE SHOULDER ARTHROPLASTY: SHX5054

## 2015-11-22 SURGERY — ARTHROPLASTY, SHOULDER, TOTAL, REVERSE
Anesthesia: General | Site: Shoulder | Laterality: Left

## 2015-11-22 MED ORDER — MENTHOL 3 MG MT LOZG: 1.0000 | LOZENGE | OROMUCOSAL | Status: DC | PRN

## 2015-11-22 MED ORDER — ONDANSETRON HCL 4 MG/2ML IJ SOLN
INTRAMUSCULAR | Status: AC
  Filled 2015-11-22: qty 2

## 2015-11-22 MED ORDER — BISACODYL 5 MG PO TBEC: 5.0000 mg | DELAYED_RELEASE_TABLET | Freq: Every day | ORAL | Status: DC | PRN

## 2015-11-22 MED ORDER — SODIUM CHLORIDE 0.9 % IR SOLN
Status: DC | PRN
  Administered 2015-11-22: 3000 mL

## 2015-11-22 MED ORDER — PROMETHAZINE HCL 25 MG/ML IJ SOLN: 6.2500 mg | INTRAMUSCULAR | Status: DC | PRN

## 2015-11-22 MED ORDER — MAGNESIUM CITRATE PO SOLN: 1.0000 | Freq: Once | ORAL | Status: DC | PRN

## 2015-11-22 MED ORDER — KETOROLAC TROMETHAMINE 30 MG/ML IJ SOLN: 30.0000 mg | Freq: Once | INTRAMUSCULAR | Status: DC

## 2015-11-22 MED ORDER — ACETAMINOPHEN 325 MG PO TABS: 650.0000 mg | ORAL_TABLET | Freq: Four times a day (QID) | ORAL | Status: DC | PRN

## 2015-11-22 MED ORDER — ROCURONIUM BROMIDE 100 MG/10ML IV SOLN
INTRAVENOUS | Status: DC | PRN
  Administered 2015-11-22: 50 mg via INTRAVENOUS

## 2015-11-22 MED ORDER — ONDANSETRON HCL 4 MG/2ML IJ SOLN: 4.0000 mg | Freq: Four times a day (QID) | INTRAMUSCULAR | Status: DC | PRN

## 2015-11-22 MED ORDER — ASPIRIN EC 81 MG PO TBEC
81.0000 mg | DELAYED_RELEASE_TABLET | ORAL | Status: DC
  Administered 2015-11-23: 81 mg via ORAL
  Filled 2015-11-22: qty 1

## 2015-11-22 MED ORDER — EPHEDRINE SULFATE 50 MG/ML IJ SOLN
INTRAMUSCULAR | Status: AC
  Filled 2015-11-22: qty 1

## 2015-11-22 MED ORDER — SODIUM CHLORIDE 0.9 % IJ SOLN
INTRAMUSCULAR | Status: AC
  Filled 2015-11-22: qty 10

## 2015-11-22 MED ORDER — METHOCARBAMOL 1000 MG/10ML IJ SOLN: 500.0000 mg | Freq: Four times a day (QID) | INTRAVENOUS | Status: DC | PRN

## 2015-11-22 MED ORDER — LACTATED RINGERS IV SOLN
INTRAVENOUS | Status: DC
  Administered 2015-11-23: 04:00:00 via INTRAVENOUS

## 2015-11-22 MED ORDER — KETOROLAC TROMETHAMINE 15 MG/ML IJ SOLN
7.5000 mg | Freq: Four times a day (QID) | INTRAMUSCULAR | Status: AC
  Administered 2015-11-22 – 2015-11-23 (×4): 7.5 mg via INTRAVENOUS
  Filled 2015-11-22 (×4): qty 1

## 2015-11-22 MED ORDER — LIDOCAINE HCL (CARDIAC) 20 MG/ML IV SOLN
INTRAVENOUS | Status: DC | PRN
  Administered 2015-11-22: 70 mg via INTRAVENOUS

## 2015-11-22 MED ORDER — DOCUSATE SODIUM 100 MG PO CAPS
100.0000 mg | ORAL_CAPSULE | Freq: Two times a day (BID) | ORAL | Status: DC
  Administered 2015-11-22 – 2015-11-24 (×4): 100 mg via ORAL
  Filled 2015-11-22 (×4): qty 1

## 2015-11-22 MED ORDER — LISINOPRIL 20 MG PO TABS
20.0000 mg | ORAL_TABLET | Freq: Every day | ORAL | Status: DC
  Administered 2015-11-23 – 2015-11-24 (×2): 20 mg via ORAL
  Filled 2015-11-22 (×2): qty 1

## 2015-11-22 MED ORDER — PROPOFOL 10 MG/ML IV BOLUS
INTRAVENOUS | Status: DC | PRN
  Administered 2015-11-22: 150 mg via INTRAVENOUS
  Administered 2015-11-22: 40 mg via INTRAVENOUS

## 2015-11-22 MED ORDER — ONDANSETRON HCL 4 MG PO TABS
4.0000 mg | ORAL_TABLET | Freq: Four times a day (QID) | ORAL | Status: DC | PRN
Start: 2015-11-22 — End: 2015-11-24

## 2015-11-22 MED ORDER — PROPOFOL 10 MG/ML IV BOLUS
INTRAVENOUS | Status: AC
  Filled 2015-11-22: qty 20

## 2015-11-22 MED ORDER — SUGAMMADEX SODIUM 200 MG/2ML IV SOLN
INTRAVENOUS | Status: DC | PRN
  Administered 2015-11-22: 200 mg via INTRAVENOUS

## 2015-11-22 MED ORDER — METOCLOPRAMIDE HCL 5 MG PO TABS: 5.0000 mg | ORAL_TABLET | Freq: Three times a day (TID) | ORAL | Status: DC | PRN

## 2015-11-22 MED ORDER — FENTANYL CITRATE (PF) 100 MCG/2ML IJ SOLN
INTRAMUSCULAR | Status: DC | PRN
  Administered 2015-11-22 (×4): 50 ug via INTRAVENOUS

## 2015-11-22 MED ORDER — ALUM & MAG HYDROXIDE-SIMETH 200-200-20 MG/5ML PO SUSP: 30.0000 mL | ORAL | Status: DC | PRN

## 2015-11-22 MED ORDER — FENTANYL CITRATE (PF) 250 MCG/5ML IJ SOLN
INTRAMUSCULAR | Status: AC
  Filled 2015-11-22: qty 5

## 2015-11-22 MED ORDER — CEFAZOLIN SODIUM-DEXTROSE 2-3 GM-% IV SOLR
2.0000 g | Freq: Four times a day (QID) | INTRAVENOUS | Status: AC
  Administered 2015-11-22 – 2015-11-23 (×3): 2 g via INTRAVENOUS
  Filled 2015-11-22 (×3): qty 50

## 2015-11-22 MED ORDER — ACETAMINOPHEN 650 MG RE SUPP: 650.0000 mg | Freq: Four times a day (QID) | RECTAL | Status: DC | PRN

## 2015-11-22 MED ORDER — LIDOCAINE HCL (CARDIAC) 20 MG/ML IV SOLN
INTRAVENOUS | Status: AC
  Filled 2015-11-22: qty 5

## 2015-11-22 MED ORDER — PHENOL 1.4 % MT LIQD: 1.0000 | OROMUCOSAL | Status: DC | PRN

## 2015-11-22 MED ORDER — ONDANSETRON HCL 4 MG/2ML IJ SOLN
INTRAMUSCULAR | Status: DC | PRN
  Administered 2015-11-22: 4 mg via INTRAVENOUS

## 2015-11-22 MED ORDER — OXYCODONE HCL 5 MG PO TABS
5.0000 mg | ORAL_TABLET | ORAL | Status: DC | PRN
  Administered 2015-11-23 – 2015-11-24 (×6): 10 mg via ORAL
  Filled 2015-11-22 (×6): qty 2

## 2015-11-22 MED ORDER — ALBUTEROL SULFATE (2.5 MG/3ML) 0.083% IN NEBU: 3.0000 mL | INHALATION_SOLUTION | RESPIRATORY_TRACT | Status: DC | PRN

## 2015-11-22 MED ORDER — HYDROMORPHONE HCL 1 MG/ML IJ SOLN
1.0000 mg | INTRAMUSCULAR | Status: DC | PRN
  Administered 2015-11-22 (×2): 1 mg via INTRAVENOUS
  Filled 2015-11-22 (×2): qty 1

## 2015-11-22 MED ORDER — METHOCARBAMOL 500 MG PO TABS
500.0000 mg | ORAL_TABLET | Freq: Four times a day (QID) | ORAL | Status: DC | PRN
  Administered 2015-11-22 – 2015-11-23 (×2): 500 mg via ORAL
  Filled 2015-11-22 (×2): qty 1

## 2015-11-22 MED ORDER — BUPIVACAINE HCL (PF) 0.5 % IJ SOLN
INTRAMUSCULAR | Status: DC | PRN
  Administered 2015-11-22: 20 mL via PERINEURAL

## 2015-11-22 MED ORDER — EPHEDRINE SULFATE 50 MG/ML IJ SOLN
INTRAMUSCULAR | Status: DC | PRN
  Administered 2015-11-22: 10 mg via INTRAVENOUS

## 2015-11-22 MED ORDER — POLYETHYLENE GLYCOL 3350 17 G PO PACK: 17.0000 g | PACK | Freq: Every day | ORAL | Status: DC | PRN

## 2015-11-22 MED ORDER — 0.9 % SODIUM CHLORIDE (POUR BTL) OPTIME
TOPICAL | Status: DC | PRN
  Administered 2015-11-22: 1000 mL

## 2015-11-22 MED ORDER — HYDROMORPHONE HCL 1 MG/ML IJ SOLN: 0.2500 mg | INTRAMUSCULAR | Status: DC | PRN

## 2015-11-22 MED ORDER — ROCURONIUM BROMIDE 50 MG/5ML IV SOLN
INTRAVENOUS | Status: AC
  Filled 2015-11-22: qty 1

## 2015-11-22 MED ORDER — LORCASERIN HCL 10 MG PO TABS: 1.0000 | ORAL_TABLET | Freq: Two times a day (BID) | ORAL | Status: DC

## 2015-11-22 MED ORDER — LIDOCAINE-EPINEPHRINE (PF) 1.5 %-1:200000 IJ SOLN
INTRAMUSCULAR | Status: DC | PRN
  Administered 2015-11-22: 15 mL via PERINEURAL

## 2015-11-22 MED ORDER — MIDAZOLAM HCL 2 MG/2ML IJ SOLN
INTRAMUSCULAR | Status: AC
  Filled 2015-11-22: qty 2

## 2015-11-22 MED ORDER — MIDAZOLAM HCL 5 MG/5ML IJ SOLN
INTRAMUSCULAR | Status: DC | PRN
  Administered 2015-11-22: 2 mg via INTRAVENOUS

## 2015-11-22 MED ORDER — PHENYLEPHRINE 40 MCG/ML (10ML) SYRINGE FOR IV PUSH (FOR BLOOD PRESSURE SUPPORT)
PREFILLED_SYRINGE | INTRAVENOUS | Status: AC
  Filled 2015-11-22: qty 10

## 2015-11-22 MED ORDER — METOCLOPRAMIDE HCL 5 MG/ML IJ SOLN: 5.0000 mg | Freq: Three times a day (TID) | INTRAMUSCULAR | Status: DC | PRN

## 2015-11-22 MED ORDER — SUGAMMADEX SODIUM 200 MG/2ML IV SOLN
INTRAVENOUS | Status: AC
  Filled 2015-11-22: qty 2

## 2015-11-22 MED ORDER — PHENYLEPHRINE HCL 10 MG/ML IJ SOLN
10.0000 mg | INTRAMUSCULAR | Status: DC | PRN
  Administered 2015-11-22: 50 ug/min via INTRAVENOUS

## 2015-11-22 SURGICAL SUPPLY — 72 items
BASEPLATE GLENOID SHLDR SM (Shoulder) ×2 IMPLANT
BLADE SAW SGTL 83.5X18.5 (BLADE) ×2 IMPLANT
BRUSH FEMORAL CANAL (MISCELLANEOUS) IMPLANT
COVER SURGICAL LIGHT HANDLE (MISCELLANEOUS) ×2 IMPLANT
DERMABOND ADVANCED (GAUZE/BANDAGES/DRESSINGS) ×1
DERMABOND ADVANCED .7 DNX12 (GAUZE/BANDAGES/DRESSINGS) ×1 IMPLANT
DRAPE ORTHO SPLIT 77X108 STRL (DRAPES) ×2
DRAPE SURG 17X11 SM STRL (DRAPES) ×2 IMPLANT
DRAPE SURG ORHT 6 SPLT 77X108 (DRAPES) ×2 IMPLANT
DRAPE U-SHAPE 47X51 STRL (DRAPES) ×2 IMPLANT
DRILL BIT 7/64X5 (BIT) ×2 IMPLANT
DRSG AQUACEL AG ADV 3.5X10 (GAUZE/BANDAGES/DRESSINGS) ×2 IMPLANT
DRSG MEPILEX BORDER 4X8 (GAUZE/BANDAGES/DRESSINGS) IMPLANT
DURAPREP 26ML APPLICATOR (WOUND CARE) ×2 IMPLANT
ELECT BLADE 4.0 EZ CLEAN MEGAD (MISCELLANEOUS) ×2
ELECT CAUTERY BLADE 6.4 (BLADE) ×2 IMPLANT
ELECT REM PT RETURN 9FT ADLT (ELECTROSURGICAL) ×2
ELECTRODE BLDE 4.0 EZ CLN MEGD (MISCELLANEOUS) ×1 IMPLANT
ELECTRODE REM PT RTRN 9FT ADLT (ELECTROSURGICAL) ×1 IMPLANT
FACESHIELD WRAPAROUND (MASK) ×6 IMPLANT
GLENOSPHERE LATERAL 36MM+4 (Shoulder) ×2 IMPLANT
GLOVE BIO SURGEON STRL SZ7.5 (GLOVE) ×2 IMPLANT
GLOVE BIO SURGEON STRL SZ8 (GLOVE) ×2 IMPLANT
GLOVE EUDERMIC 7 POWDERFREE (GLOVE) ×2 IMPLANT
GLOVE SS BIOGEL STRL SZ 7.5 (GLOVE) ×1 IMPLANT
GLOVE SUPERSENSE BIOGEL SZ 7.5 (GLOVE) ×1
GOWN STRL REUS W/ TWL LRG LVL3 (GOWN DISPOSABLE) IMPLANT
GOWN STRL REUS W/ TWL XL LVL3 (GOWN DISPOSABLE) ×2 IMPLANT
GOWN STRL REUS W/TWL LRG LVL3 (GOWN DISPOSABLE)
GOWN STRL REUS W/TWL XL LVL3 (GOWN DISPOSABLE) ×2
HANDPIECE INTERPULSE COAX TIP (DISPOSABLE) ×1
KIT BASIN OR (CUSTOM PROCEDURE TRAY) ×2 IMPLANT
KIT ROOM TURNOVER OR (KITS) ×2 IMPLANT
LINER HUMERAL 36 +3MM SM (Shoulder) ×2 IMPLANT
MANIFOLD NEPTUNE II (INSTRUMENTS) ×2 IMPLANT
NDL SUT 6 .5 CRC .975X.05 MAYO (NEEDLE) IMPLANT
NEEDLE HYPO 25GX1X1/2 BEV (NEEDLE) IMPLANT
NEEDLE MAYO TAPER (NEEDLE)
NS IRRIG 1000ML POUR BTL (IV SOLUTION) ×2 IMPLANT
PACK SHOULDER (CUSTOM PROCEDURE TRAY) ×2 IMPLANT
PAD ARMBOARD 7.5X6 YLW CONV (MISCELLANEOUS) ×4 IMPLANT
PASSER SUT SWANSON 36MM LOOP (INSTRUMENTS) ×2 IMPLANT
PRESSURIZER FEMORAL UNIV (MISCELLANEOUS) IMPLANT
RESTRAINT HEAD UNIVERSAL NS (MISCELLANEOUS) IMPLANT
SCREW CENTRAL NONLOCK 25MM (Screw) ×2 IMPLANT
SCREW LOCK PERIPHERAL 30MM (Shoulder) ×4 IMPLANT
SET HNDPC FAN SPRY TIP SCT (DISPOSABLE) ×1 IMPLANT
SET PIN UNIVERSAL REVERSE (SET/KITS/TRAYS/PACK) ×2 IMPLANT
SLING ARM FOAM STRAP LRG (SOFTGOODS) ×2 IMPLANT
SPACER SHLD UNI REV 36 +6 (Shoulder) ×2 IMPLANT
SPONGE LAP 18X18 X RAY DECT (DISPOSABLE) ×2 IMPLANT
SPONGE LAP 4X18 X RAY DECT (DISPOSABLE) ×2 IMPLANT
STEM REVERSE SIZE 5-135 (Shoulder) ×2 IMPLANT
SUCTION FRAZIER TIP 10 FR DISP (SUCTIONS) ×2 IMPLANT
SUT BONE WAX W31G (SUTURE) IMPLANT
SUT FIBERWIRE #2 38 T-5 BLUE (SUTURE) ×4
SUT MNCRL AB 3-0 PS2 18 (SUTURE) ×2 IMPLANT
SUT MON AB 2-0 CT1 36 (SUTURE) ×2 IMPLANT
SUT VIC AB 1 CT1 27 (SUTURE) ×1
SUT VIC AB 1 CT1 27XBRD ANBCTR (SUTURE) ×1 IMPLANT
SUT VIC AB 2-0 CT1 27 (SUTURE) ×1
SUT VIC AB 2-0 CT1 TAPERPNT 27 (SUTURE) ×1 IMPLANT
SUT VIC AB 2-0 SH 27 (SUTURE)
SUT VIC AB 2-0 SH 27X BRD (SUTURE) IMPLANT
SUTURE FIBERWR #2 38 T-5 BLUE (SUTURE) ×2 IMPLANT
SYR 30ML SLIP (SYRINGE) ×2 IMPLANT
SYR CONTROL 10ML LL (SYRINGE) ×2 IMPLANT
TOWEL OR 17X24 6PK STRL BLUE (TOWEL DISPOSABLE) ×2 IMPLANT
TOWEL OR 17X26 10 PK STRL BLUE (TOWEL DISPOSABLE) ×2 IMPLANT
TOWER CARTRIDGE SMART MIX (DISPOSABLE) IMPLANT
TRAY FOLEY CATH 16FRSI W/METER (SET/KITS/TRAYS/PACK) IMPLANT
WATER STERILE IRR 1000ML POUR (IV SOLUTION) IMPLANT

## 2015-11-22 NOTE — Progress Notes (Signed)
Report given to emily rn as caregiver 

## 2015-11-22 NOTE — Anesthesia Postprocedure Evaluation (Signed)
Anesthesia Post Note  Patient: Colleen Gray  Procedure(s) Performed: Procedure(s) (LRB): LEFT REVERSE SHOULDER ARTHROPLASTY (Left)  Patient location during evaluation: PACU Anesthesia Type: General and Regional Level of consciousness: awake and alert Pain management: pain level controlled Vital Signs Assessment: post-procedure vital signs reviewed and stable Respiratory status: spontaneous breathing, nonlabored ventilation, respiratory function stable and patient connected to nasal cannula oxygen Cardiovascular status: blood pressure returned to baseline and stable Postop Assessment: no signs of nausea or vomiting Anesthetic complications: no    Last Vitals:  Filed Vitals:   11/22/15 1015 11/22/15 1030  BP: 107/52 117/59  Pulse: 53 63  Temp:    Resp:      Last Pain:  Filed Vitals:   11/22/15 1033  PainSc: 0-No pain                 Nataleah Scioneaux S

## 2015-11-22 NOTE — Op Note (Signed)
11/22/2015  9:24 AM  PATIENT:   Colleen Gray  64 y.o. female  PRE-OPERATIVE DIAGNOSIS:  LEFT SHOULDER ROTATOR CUFF TEAR, ARTHROPATHY  POST-OPERATIVE DIAGNOSIS:  same  PROCEDURE:  L reverse shoulder arthroplasty, #5 arthrex stem 135 degree, +6 spacer, +3 poly, 36 +4 glenosphere, small baseplate  SURGEON:  Fiora Weill, Metta Clines M.D.  ASSISTANTS: Shuford pac   ANESTHESIA:   GET + ISB  EBL: 200  SPECIMEN:  none  Drains: none   PATIENT DISPOSITION:  PACU - hemodynamically stable.    PLAN OF CARE: Admit for overnight observation  Dictation# [id number]   Contact # [phone]

## 2015-11-22 NOTE — Anesthesia Procedure Notes (Addendum)
Anesthesia Regional Block:  Interscalene brachial plexus block  Pre-Anesthetic Checklist: ,, timeout performed, Correct Patient, Correct Site, Correct Laterality, Correct Procedure, Correct Position, site marked, Risks and benefits discussed,  Surgical consent,  Pre-op evaluation,  At surgeon's request and post-op pain management  Laterality: Left  Prep: chloraprep       Needles:  Injection technique: Single-shot  Needle Type: Echogenic Needle     Needle Length: 9cm 9 cm Needle Gauge: 21 and 21 G    Additional Needles:  Procedures: ultrasound guided (picture in chart) Interscalene brachial plexus block Narrative:  Injection made incrementally with aspirations every 5 mL.  Performed by: Personally  Anesthesiologist: ROSE, Iona Beard  Additional Notes: Patient tolerated the procedure well without complications   Procedure Name: Intubation Date/Time: 11/22/2015 7:42 AM Performed by: Greggory Stallion, Gilad Dugger L Pre-anesthesia Checklist: Patient identified, Emergency Drugs available, Patient being monitored, Suction available and Timeout performed Patient Re-evaluated:Patient Re-evaluated prior to inductionOxygen Delivery Method: Circle system utilized Preoxygenation: Pre-oxygenation with 100% oxygen Intubation Type: IV induction Ventilation: Mask ventilation without difficulty Laryngoscope Size: Mac and 3 Grade View: Grade I Tube type: Oral Tube size: 7.5 mm Number of attempts: 1 Airway Equipment and Method: Stylet Placement Confirmation: ETT inserted through vocal cords under direct vision,  positive ETCO2 and breath sounds checked- equal and bilateral Secured at: 20 cm Tube secured with: Tape Dental Injury: Teeth and Oropharynx as per pre-operative assessment

## 2015-11-22 NOTE — Progress Notes (Signed)
Ted hose ordered for pt., x-large sent. When putting on pt. She stated it was too tight and very uncomfortable. Ted hose taken off.

## 2015-11-22 NOTE — H&P (Signed)
Dwain Sarna    Chief Complaint: LEFT SHOULDER ROTATOR CUFF TEAR, ARTHROPATHYI HPI: The patient is a 64 y.o. 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 Asthma   . History of rheumatic fever     with a heart murmer  . Ovarian cyst 1985    removed  . Hemorrhoid   . Bradycardia   . Heart murmur     due to rheumatic fever  . Enlarged heart     Sees Dr. Einar Gip  . Hypertension   . Sleep apnea     does not use Cpap (after weight loss no longer needed)  . Pneumonia   . Arthritis   . Anemia     Past Surgical History  Procedure Laterality Date  . Bunionectomy Bilateral 1980's -left, 1999 -right,2009 left with plates and screws    with hammertoe surgery  . Rotator cuff repair Right 2006  . Laparoscopic gastric bypass  2007  . Laparoscopic ovarian cystectomy Right 1985  . Dilation and curettage of uterus  early 1980's    for missed AB  . Colonoscopy      Family History  Problem Relation Age of Onset  . Colon cancer Mother 35  . Diabetes Mother   . Hypertension Mother   . Diabetes Father   . Hypertension Father   . Colon cancer Maternal Grandmother 96  . Diabetes Paternal Grandmother   . Diabetes Paternal Grandfather     Social History:  reports that she has never smoked. She has never used smokeless tobacco. She reports that she does not drink alcohol or use illicit drugs.  Allergies:  Allergies  Allergen Reactions  . Penicillins     Skin rash     Medications Prior to Admission  Medication Sig Dispense Refill  . albuterol (PROAIR HFA) 108 (90 BASE) MCG/ACT inhaler Inhale 2 puffs into the lungs every 4 (four) hours as needed for shortness of breath.     . Alpha-Lipoic Acid 200 MG TABS Take 1 tablet by mouth daily.    Marland Kitchen aspirin 81 MG tablet Take 81 mg by mouth 3 (three) times a week. No specific days    . Calcium-Magnesium-Vitamin D (CALCIUM 1200+D3 PO) Take 1 tablet by mouth daily.    . Diclofenac  Sodium (PENNSAID) 2 % SOLN Place 1 spray onto the skin daily as needed. For the shoulder    . EUFLEXXA 20 MG/2ML SOSY 20 mg every 6 (six) months. Last dose was in July 2016    . lisinopril (PRINIVIL,ZESTRIL) 20 MG tablet Take 20 mg by mouth daily.  1  . Lorcaserin HCl (BELVIQ) 10 MG TABS Take 1 tablet by mouth 2 (two) times daily.    . Misc Natural Products (SUPER GREENS PO) Take 2 capsules by mouth daily.    . Multiple Vitamin (MULTIVITAMIN WITH MINERALS) TABS tablet Take 1 tablet by mouth daily.    Marland Kitchen omega-3 acid ethyl esters (LOVAZA) 1 G capsule Take 1 g by mouth 2 (two) times daily.    Marland Kitchen PRESCRIPTION MEDICATION Apply 1-2 Squirts topically every 3 (three) hours as needed (Shoulder pain). Diclofenac 3%, Baclofen 2%, Lidocaine 5%, 1% menthol    . TURMERIC CURCUMIN PO Take 1 capsule by mouth daily.    . vitamin B-12 (CYANOCOBALAMIN) 100 MCG tablet Take 50 mcg by mouth daily.    . vitamin C (ASCORBIC ACID) 500 MG tablet Take 500 mg by mouth daily.  Physical Exam: left shoulder with painful and restricted motion as noted at recent office visits.  Vitals  Temp:  [97.5 F (36.4 C)] 97.5 F (36.4 C) (12/01 0631) Pulse Rate:  [66] 66 (12/01 0631) Resp:  [20] 20 (12/01 0631) BP: (127)/(62) 127/62 mmHg (12/01 0631) SpO2:  [98 %] 98 % (12/01 0631)  Assessment/Plan  Impression: LEFT SHOULDER ROTATOR CUFF TEAR, ARTHROPATHYI  Plan of Action: Procedure(s): LEFT REVERSE SHOULDER ARTHROPLASTY  Tyjae Issa M Angella Montas 11/22/2015, 7:23 AM Contact # [phone]

## 2015-11-22 NOTE — Transfer of Care (Signed)
Immediate Anesthesia Transfer of Care Note  Patient: Colleen Gray  Procedure(s) Performed: Procedure(s): LEFT REVERSE SHOULDER ARTHROPLASTY (Left)  Patient Location: PACU  Anesthesia Type:General  Level of Consciousness: awake, alert , oriented and patient cooperative  Airway & Oxygen Therapy: Patient Spontanous Breathing and Patient connected to nasal cannula oxygen  Post-op Assessment: Report given to RN, Post -op Vital signs reviewed and stable and Patient moving all extremities  Post vital signs: Reviewed and stable  Last Vitals:  Filed Vitals:   11/22/15 0631  BP: 127/62  Pulse: 66  Temp: 36.4 C  Resp: 20    Complications: No apparent anesthesia complications

## 2015-11-22 NOTE — Anesthesia Preprocedure Evaluation (Signed)
Anesthesia Evaluation  Patient identified by MRN, date of birth, ID band Patient awake    Reviewed: Allergy & Precautions, NPO status , Patient's Chart, lab work & pertinent test results  Airway Mallampati: II  TM Distance: >3 FB Neck ROM: Full    Dental no notable dental hx.    Pulmonary neg pulmonary ROS,    Pulmonary exam normal breath sounds clear to auscultation       Cardiovascular hypertension, Pt. on medications Normal cardiovascular exam Rhythm:Regular Rate:Normal     Neuro/Psych negative neurological ROS  negative psych ROS   GI/Hepatic negative GI ROS, Neg liver ROS,   Endo/Other  Morbid obesity  Renal/GU negative Renal ROS  negative genitourinary   Musculoskeletal negative musculoskeletal ROS (+)   Abdominal   Peds negative pediatric ROS (+)  Hematology negative hematology ROS (+)   Anesthesia Other Findings   Reproductive/Obstetrics negative OB ROS                             Anesthesia Physical Anesthesia Plan  ASA: III  Anesthesia Plan: General   Post-op Pain Management: GA combined w/ Regional for post-op pain   Induction: Intravenous  Airway Management Planned: Oral ETT  Additional Equipment:   Intra-op Plan:   Post-operative Plan: Extubation in OR  Informed Consent: I have reviewed the patients History and Physical, chart, labs and discussed the procedure including the risks, benefits and alternatives for the proposed anesthesia with the patient or authorized representative who has indicated his/her understanding and acceptance.   Dental advisory given  Plan Discussed with: CRNA and Surgeon  Anesthesia Plan Comments:         Anesthesia Quick Evaluation

## 2015-11-23 ENCOUNTER — Encounter (HOSPITAL_COMMUNITY): Payer: Self-pay | Admitting: Orthopedic Surgery

## 2015-11-23 DIAGNOSIS — M19012 Primary osteoarthritis, left shoulder: Secondary | ICD-10-CM | POA: Diagnosis not present

## 2015-11-23 MED ORDER — METHOCARBAMOL 500 MG PO TABS: 500.0000 mg | ORAL_TABLET | Freq: Three times a day (TID) | ORAL | Status: DC | PRN

## 2015-11-23 MED ORDER — HYDROCODONE-ACETAMINOPHEN 5-325 MG PO TABS: 1.0000 | ORAL_TABLET | ORAL | Status: DC | PRN

## 2015-11-23 MED ORDER — HYDROCODONE-ACETAMINOPHEN 5-325 MG PO TABS
1.0000 | ORAL_TABLET | ORAL | Status: DC | PRN
  Administered 2015-11-24: 2 via ORAL
  Filled 2015-11-23: qty 2

## 2015-11-23 NOTE — Discharge Instructions (Signed)

## 2015-11-23 NOTE — Evaluation (Signed)
Occupational Therapy Evaluation Patient Details Name: Colleen Gray MRN: [id number] DOB: 05/08/1951 Today's Date: 11/23/2015    History of Present Illness The patient is a 64 y.o. 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 S/P LEFT REVERSE SHOULDER ARTHROPLASTY. PMH: asthma, enlarged heart, HTN, sleep apnea, arithritis, anemia    Clinical Impression   Pt reports she was independent with ADLs PTA. Currently pt is min assist-min guard for functional mobility and min assist for ADLs. Began shoulder, ADL, and safety education with pt; she verbalized understanding. Pt is planning to d/c home alone. Pt would benefit from skilled OT in order to maximize independence and safety with UB ADLs, toilet and tub transfers, and L UE HEP.     Follow Up Recommendations  Other (comment) (Follow up per MD)    Equipment Recommendations  None recommended by OT    Recommendations for Other Services PT consult     Precautions / Restrictions Precautions Precautions: Shoulder;Fall Type of Shoulder Precautions: Active protocol: AROM ebow, wrist, hand OK. AROM/PROM shoulder FF 90, ABD 60. NO ER/IR. Shoulder Interventions: Shoulder sling/immobilizer;At all times;Off for dressing/bathing/exercises Precaution Booklet Issued: Yes (comment) Precaution Comments: Reviewed precautions Required Braces or Orthoses: Sling Restrictions Weight Bearing Restrictions: Yes LUE Weight Bearing: Non weight bearing      Mobility Bed Mobility Overal bed mobility: Needs Assistance Bed Mobility: Supine to Sit     Supine to sit: Min guard;HOB elevated        Transfers Overall transfer level: Needs assistance Equipment used: 1 person hand held assist Transfers: Sit to/from Stand Sit to Stand: Min assist         General transfer comment: Min assist to boost up from EOB, min guard for sit to stand from toilet. VC for hand placement and technique.    Balance Overall balance assessment:  Needs assistance         Standing balance support: No upper extremity supported Standing balance-Leahy Scale: Fair Standing balance comment: Able to stand to wash hands at sink, complete peri care.                            ADL Overall ADL's : Needs assistance/impaired Eating/Feeding: Set up;Sitting   Grooming: Wash/dry hands;Min guard;Standing   Upper Body Bathing: Minimal assitance;Sitting   Lower Body Bathing: Minimal assistance;Sit to/from stand   Upper Body Dressing : Minimal assistance;Sitting   Lower Body Dressing: Minimal assistance;Sit to/from stand Lower Body Dressing Details (indicate cue type and reason): Pt unable to reach feet at this time. Toilet Transfer: Minimal assistance;Ambulation;Comfort height toilet Toilet Transfer Details (indicate cue type and reason): Hand held assist for descent to toilet, min guard for sit to stand. Pt reports that she has a vainty on the R side of her toilet that she can use to stabalize herself during toilet transfers.  Toileting- Water quality scientist and Hygiene: Min guard;Sit to/from stand (for toilet hygiene)       Functional mobility during ADLs: Min guard General ADL Comments: No family present during OT eval. Educated pt on sling management and wear schedule, edema management techniques, NWB status, UB bathing and dressing techniques; pt verbalized understanding.      Vision     Perception     Praxis      Pertinent Vitals/Pain Pain Assessment: 0-10 Pain Score: 5  Pain Location: L shoulder Pain Descriptors / Indicators: Aching;Grimacing;Sore;Heaviness Pain Intervention(s): Limited activity within patient's tolerance;Monitored during session;Repositioned;Ice applied  Hand Dominance Left   Extremity/Trunk Assessment Upper Extremity Assessment Upper Extremity Assessment: LUE deficits/detail;RUE deficits/detail RUE Deficits / Details: WFL LUE Deficits / Details: Wrist and hand ROM and MMT WFL.   LUE: Unable to fully assess due to pain;Unable to fully assess due to immobilization   Lower Extremity Assessment Lower Extremity Assessment: Defer to PT evaluation       Communication Communication Communication: No difficulties   Cognition Arousal/Alertness: Awake/alert Behavior During Therapy: WFL for tasks assessed/performed Overall Cognitive Status: Within Functional Limits for tasks assessed                     General Comments       Exercises Exercises: Shoulder     Shoulder Instructions Shoulder Instructions Donning/doffing shirt without moving shoulder: Minimal assistance (education complete) Method for sponge bathing under operated UE: Minimal assistance (education complete) Donning/doffing sling/immobilizer: Minimal assistance (education complete) Correct positioning of sling/immobilizer: Minimal assistance (education complete) Sling wearing schedule (on at all times/off for ADL's): Supervision/safety (education complete) Proper positioning of operated UE when showering: Supervision/safety (education complete) Positioning of UE while sleeping: Minimal assistance (education complete)    Home Living Family/patient expects to be discharged to:: Private residence Living Arrangements: Alone Available Help at Discharge: Friend(s);Available PRN/intermittently (Able to come for ~1 hour per day, lives 40 miles away) Type of Home: House Home Access: Stairs to enter Technical brewer of Steps: 3   Home Layout: One level     Bathroom Shower/Tub: Teacher, early years/pre: Handicapped height     Home Equipment: Tub bench (pt reports she has access to cane and walker )          Prior Functioning/Environment Level of Independence: Independent             OT Diagnosis: Generalized weakness;Acute pain   OT Problem List: Decreased strength;Decreased range of motion;Impaired balance (sitting and/or standing);Decreased safety  awareness;Decreased knowledge of use of DME or AE;Decreased knowledge of precautions;Obesity;Impaired UE functional use;Pain   OT Treatment/Interventions: Self-care/ADL training;Therapeutic exercise;DME and/or AE instruction;Patient/family education    OT Goals(Current goals can be found in the care plan section) Acute Rehab OT Goals Patient Stated Goal: to reduce pain OT Goal Formulation: With patient Time For Goal Achievement: 12/07/15 Potential to Achieve Goals: Good ADL Goals Pt Will Perform Upper Body Bathing: with modified independence;sitting Pt Will Perform Upper Body Dressing: with modified independence;sitting Pt Will Transfer to Toilet: with modified independence;ambulating Pt Will Perform Toileting - Clothing Manipulation and hygiene: with modified independence;sit to/from stand Pt Will Perform Tub/Shower Transfer: Tub transfer;with modified independence;ambulating;tub bench Pt/caregiver will Perform Home Exercise Program: Increased ROM;Left upper extremity;Independently;With written HEP provided  OT Frequency: Min 3X/week   Barriers to D/C: Decreased caregiver support          Co-evaluation              End of Session Equipment Utilized During Treatment: Gait belt;Other (comment) (sling) Nurse Communication: Mobility status  Activity Tolerance: Patient tolerated treatment well Patient left: in chair;with call bell/phone within reach   Time: 0837-0909 OT Time Calculation (min): 32 min Charges:  OT General Charges $OT Visit: 1 Procedure OT Evaluation $Initial OT Evaluation Tier I: 1 Procedure OT Treatments $Self Care/Home Management : 8-22 mins G-Codes: OT G-codes **NOT FOR INPATIENT CLASS** Functional Assessment Tool Used: Clinical judgement Functional Limitation: Self care Self Care Current Status [id number]): At least 20 percent but less than 40 percent impaired, limited or restricted Self Care Goal Status (  [id number]): At least 1 percent but less than 20 percent  impaired, limited or restricted   Binnie Kand M.S., OTR/L Pager: [phone]  11/23/2015, 9:41 AM

## 2015-11-23 NOTE — Progress Notes (Signed)
Occupational Therapy Treatment Patient Details Name: Colleen Gray MRN: [id number] DOB: [birth year] Today's Date: 11/23/2015    History of present illness The patient is a 64 y.o. 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 S/P LEFT REVERSE SHOULDER ARTHROPLASTY. PMH: asthma, enlarged heart, HTN, sleep apnea, arithritis, anemia    OT comments  Focus of PM session on L UE exercises. Pt still nervous about d/c home with a lot of questions about how she will manage activities, however, she is agreeable to it. Pt able to demonstrate good AROM of elbow, wrist, and hand. Tolerated AAROM (FF and ABD) to shoulder with min assist from therapist. Demonstrated self-range technique but pt had difficulty completing on her own. Will continue to follow pt acutely.    Follow Up Recommendations  Other (comment) (follow up per MD)    Equipment Recommendations  None recommended by OT    Recommendations for Other Services PT consult    Precautions / Restrictions Precautions Precautions: Shoulder;Fall Type of Shoulder Precautions: Active protocol: AROM ebow, wrist, hand OK. AROM/PROM shoulder FF 90, ABD 60. NO ER/IR. Shoulder Interventions: Shoulder sling/immobilizer;At all times;Off for dressing/bathing/exercises Precaution Comments: Reviewed precautions Required Braces or Orthoses: Sling       Mobility Bed Mobility               General bed mobility comments: Pt OOB in chair  Transfers                      Balance                                   ADL                                         General ADL Comments: Family present but stepped out during OT session. Educated pt on L UE exercises; pt verbalized understanding but had difficulty completing shoulder ROM independently.      Vision                     Perception     Praxis      Cognition   Behavior During Therapy: [facility] for tasks  assessed/performed Overall Cognitive Status: Within Functional Limits for tasks assessed                       Extremity/Trunk Assessment               Exercises Shoulder Exercises Shoulder Flexion: AAROM;Left;10 reps;Seated (able to achieve ~30 degrees flexion) Shoulder ABduction: AAROM;Left;10 reps;Seated (able to achieve ~45 degrees ABduction) Elbow Flexion: AROM;Left;10 reps;Seated (full ROM) Wrist Flexion: AROM;Left;10 reps;Seated (full ROM) Digit Composite Flexion: AROM;Left;10 reps;Seated (full ROM) Neck Flexion: AROM;5 reps;Seated Neck Extension: AROM;5 reps;Seated Neck Lateral Flexion - Right: AROM;5 reps;Seated Neck Lateral Flexion - Left: AROM;5 reps;Seated   Shoulder Instructions       General Comments      Pertinent Vitals/ Pain       Pain Assessment: Faces Faces Pain Scale: Hurts whole lot Pain Location: L shoulder with exercises Pain Descriptors / Indicators: Grimacing;Guarding;Sore Pain Intervention(s): Limited activity within patient's tolerance;Monitored during session;Ice applied  Home Living  Prior Functioning/Environment              Frequency Min 3X/week     Progress Toward Goals  OT Goals(current goals can now be found in the care plan section)  Progress towards OT goals: Progressing toward goals  Acute Rehab OT Goals Patient Stated Goal: to go home tomorrow OT Goal Formulation: With patient  Plan Discharge plan remains appropriate    Co-evaluation                 End of Session Equipment Utilized During Treatment: Other (comment) (sling )   Activity Tolerance Patient tolerated treatment well   Patient Left in chair;with call bell/phone within reach   Nurse Communication          Time: [id number] OT Time Calculation (min): 19 min  Charges: OT General Charges $OT Visit: 1 Procedure OT Treatments $Therapeutic Exercise: 8-22 mins  Binnie Kand  M.S., OTR/L Pager: [phone]  11/23/2015, 2:03 PM

## 2015-11-23 NOTE — Care Management Note (Signed)
Case Management Note  Patient Details  Name: Colleen Gray MRN: [id number] Date of Birth: 1951-02-03  Subjective/Objective:      S/p left reverse shoulder arthroplasty              Action/Plan: Spoke with patient about discharge plan, she selected [facility]. Contacted Colleen Gray with [facility] and set up [facility]. Patient requested information about [facility] transportation. Gave patient copy of online information and application for [facility].      Expected Discharge Date:                  Expected Discharge Plan:  [place]  In-House Referral:  NA  Discharge planning Services  CM Consult  Post Acute Care Choice:  Home Health Choice offered to:  Patient  DME Arranged:    DME Agency:     HH Arranged:  PT, OT HH Agency:  [facility]  Status of Service:  Completed, signed off  Medicare Important Message Given:    Date Medicare IM Given:    Medicare IM give by:    Date Additional Medicare IM Given:    Additional Medicare Important Message give by:     If discussed at [place] of Stay Meetings, dates discussed:    Additional Comments:  Nila Nephew, RN 11/23/2015, 12:29 PM

## 2015-11-23 NOTE — Progress Notes (Signed)
Colleen Gray  MRN: [id number] DOB/Age: [birth year] 64 y.o. Physician: Rada Hay Procedure: Procedure(s) (LRB): LEFT REVERSE SHOULDER ARTHROPLASTY (Left)     Subjective: Overall doing well, had significant muscle spasms in legs last night eased with robaxin and hydration. Biggest problem is fear going home as lives alone.   Vital Signs Temp:  [97.7 F (36.5 C)-99.6 F (37.6 C)] 98.6 F (37 C) (12/02 0433) Pulse Rate:  [52-92] 80 (12/02 0433) Resp:  [11-18] 18 (12/02 0433) BP: (99-118)/(51-73) 109/54 mmHg (12/02 0433) SpO2:  [94 %-100 %] 96 % (12/02 0433)  Lab Results No results for input(s): WBC, HGB, HCT, PLT in the last 72 hours. BMET No results for input(s): NA, K, CL, CO2, GLUCOSE, BUN, CREATININE, CALCIUM in the last 72 hours. INR  Date Value Ref Range Status  11/19/2015 1.05 0.00 - 1.49 Final     Exam Left shoulder dressing dry NVI Up in chair and did well with initial OT visit        Plan Will keep until tomorrow to maximize mobility and therapies Discussed with patient plan and home with [facility] services and also has niece who can check on her RX left in chart  [facility] PA-C  for Dr.Kevin Supple 11/23/2015, 9:29 AM Contact # [phone]

## 2015-11-23 NOTE — Progress Notes (Signed)
   11/23/15 1028  Clinical Encounter Type  Visited With Patient;Health care provider  Visit Type Initial  Referral From Patient  Spiritual Encounters  [place] met with patient who wanted to complete an advanced directive. Chaplain helped patient complete the living will and HCPOA forms. Chaplain support available as needed.   Jeri Lager, Chaplain 11/23/2015 10:29 AM

## 2015-11-23 NOTE — Op Note (Signed)
Colleen Gray, Colleen Gray                ACCOUNT NO.:  [id number]  MEDICAL RECORD NO.:  [id number]  LOCATION:  5N31C                        FACILITY:  [facility]  PHYSICIAN:  Metta Clines. Longino Trefz, M.D.  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  PROCEDURE:  Left reverse shoulder arthroplasty utilizing a press-fit size 5 Arthrex stem, 135-degree neck angle, a +6 metal spacer, +3 polyethylene insert, a 36+ 4 glenosphere applied on a small baseplate.  SURGEON:  Metta Clines. Maysa Lynn, M.D.  Terrence DupontOlivia Mackie A. Shuford, P.A.-C.  ANESTHESIA:  General endotracheal as well as an interscalene block.  ESTIMATED BLOOD LOSS:  200 mL.  DRAINS:  None.  HISTORY:  Colleen Gray is a 64 year old female who has had progressive increasing left shoulder pain and functional limitations related to end- stage osteoarthritis.  She is now become quite functionally debilitated and incapacitated with pain and was brought to the operating room at this time for planned left shoulder reverse arthroplasty.  Preoperatively, I counseled Colleen Gray regarding treatment options and potential risks versus benefits thereof.  Possible surgical complications were all reviewed including bleeding, infection, neurovascular injury, persistent pain, loss of motion, anesthetic complication, failure of the implant, possible need for additional surgery.  She understands and accepts and agrees with our planned procedure.  PROCEDURE IN DETAIL:  After undergoing routine preop evaluation, the patient received prophylactic antibiotics and an interscalene block was established in the holding area by the Anesthesia Department.  Placed supine on the operating table, underwent smooth induction of the  general endotracheal anesthesia.  She was placed in the beach-chair position and appropriately padded and protected.  The left shoulder girdle region was then sterilely prepped and draped in standard fashion.  Time-out was called.  An anterior deltopectoral approach of the left shoulder was made through a 12-cm deltopectoral incision.  Skin flaps were elevated. Electrocautery was used for hemostasis.  Dissection was carried deeply. The cephalic vein was identified and taken laterally with the deltoid. The pea major was retracted medially.  The tendon was divided to enhance exposure.  Adhesion was divided beneath the deltoid and Brown retractor was placed.  The conjoined tendon was identified, mobilized and retracted medially.  We then unroofed the biceps tendon, tenotomized this and then performed a dissection of the subscapularis way from the lesser tuberosity and then tagged the free margin with a pair of #2 FiberWire sutures.  We then divided the capsular attachments from the anteroinferior and inferior aspects of the humeral neck to allow complete delivery of the humeral head to the wound.  There was marked synovitis and complete loss of the rotator cuff superiorly and very advanced glenohumeral arthritis with peripheral osteophytes.  Once the humeral head was delivered through the wound, we then outlined our proposed humeral head resection at the 135-degree angle and matched the native retroversion, which was at approximately -20 degrees.  The humeral head was then resected with an oscillating saw.  At this point, we placed  a metal cap over the cut surface of the proximal humerus and then exposed the glenoid with combination of Fukuda, pitchfork, and snake tongue retractors.  We performed a circumferential labral resection and capsular release and mobilized the subscap and gained complete exposure of the periphery of the glenoid and performed a synovectomy as well.  Once the glenoid  was exposed, we placed our central guidepin and then reamed with the central reamer followed by the peripheral reamer.  We confirmed proper contours of the reamed glenoid and then impacted our small baseplate and placed our central 6.5 cortical compression screw and then placed in an inferior and superior locking screw, 4.5, all of which obtained excellent bony purchase and fixation.  We then used our peripheral reamer to clear the margins of the glenoid and then impacted our 36 x +4 glenosphere and confirmed that it was stably engaged with excellent fixation.  At this point, we then returned our attention to the proximal humerus.  We performed hand reaming of the humeral canal up to size 6.  We then broached with the size 5 broach and this showed a very tight fixation with some significant after we were able to ultimately seat the 5 broach to the proper level and given the tight fixation, we decided upon using the size 5 implant.  We placed the size 5 reaming body and then reamed the metaphysis with the appropriate size 36 reamer.  We performed a trial reduction, which showed good soft tissue balance.  We then implanted our final size 5 stem with the appropriate metaphysis attaching the 135 degrees.  This was impacted into position with excellent fixation.  We then performed a series of trial reductions and ultimately we found the +9 extension was appropriate, so we used a +6 metal extension and +3 poly and our final reduction showed excellent soft tissue balance, excellent shoulder motion and good stability.  After final reduction, the joint was copiously irrigated.  Hemostasis was obtained.  We repaired the subscapularis to the metaphysis, proximal humerus through bone tunnels with #2 FiberWires.  The deltopectoral interval was then reapproximated with figure-of-eight #1 Vicryl sutures.  2-0 Monocryl was used for subcu layer, intracuticular 3-0 Monocryl for the skin followed by  Dermabond and an Aquacel dressing.  Left arm was then placed in a sling.  The patient was then awakened, extubated, and taken to the recovery room in stable condition.  Colleen Loges, PA-C was used as an Environmental consultant throughout this case and essential for help with positioning of the patient, positioning of the extremity, management of the retractors, tissue manipulation, implantation of prosthesis, wound closure and intraoperative decision making.     Metta Clines. Joriel Streety, M.D.     KMS/MEDQ  D:  11/22/2015  T:  11/23/2015  Job:  [id number]

## 2015-11-23 NOTE — Evaluation (Addendum)
Physical Therapy Evaluation Patient Details Name: Colleen Gray MRN: [id number] DOB: 10-05-1951 Today's Date: 11/23/2015   History of Present Illness  The patient is a 64 y.o. 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 S/P LEFT REVERSE SHOULDER ARTHROPLASTY. PMH: asthma, enlarged heart, HTN, sleep apnea, arithritis, anemia   Clinical Impression  Patient presents with problems listed below.  Will benefit from acute PT to maximize functional independence prior to discharge home alone.  Recommend cane for use at home, and HHPT f/u for balance/safety.    Follow Up Recommendations Home health PT;Supervision - Intermittent    Equipment Recommendations  Cane    Recommendations for Other Services       Precautions / Restrictions Precautions Precautions: Shoulder;Fall Shoulder Interventions: Shoulder sling/immobilizer;At all times;Off for dressing/bathing/exercises Required Braces or Orthoses: Sling Restrictions Weight Bearing Restrictions: Yes LUE Weight Bearing: Non weight bearing      Mobility  Bed Mobility               General bed mobility comments: Patient in chair  Transfers Overall transfer level: Needs assistance Equipment used: Straight cane Transfers: Sit to/from Stand Sit to Stand: Min assist;Min guard         General transfer comment: Verbal cues for RUE placement and technique.  Min assist to rise from low recliner.  Min guard from higher toilet.  Ambulation/Gait Ambulation/Gait assistance: Min guard Ambulation Distance (Feet): 50 Feet Assistive device: Straight cane Gait Pattern/deviations: Step-through pattern;Decreased stride length;Shuffle Gait velocity: decreased Gait velocity interpretation: Below normal speed for age/gender General Gait Details: Verbal cues for safe use of cane.  Patient with slow gait pattern.  Increased balance with use of cane.  Stairs            Wheelchair Mobility    Modified Rankin (Stroke  Patients Only)       Balance                                             Pertinent Vitals/Pain Pain Assessment: Faces Faces Pain Scale: Hurts little more Pain Location: Lt shoulder/elbow with mobility Pain Descriptors / Indicators: Aching;Sore Pain Intervention(s): Monitored during session;Ice applied    Home Living Family/patient expects to be discharged to:: Private residence Living Arrangements: Alone Available Help at Discharge: Family;Friend(s);Available PRN/intermittently Type of Home: House Home Access: Stairs to enter   [employer] of Steps: 3 Home Layout: One level Home Equipment: Tub bench, lift chair      Prior Function Level of Independence: Independent               Hand Dominance   Dominant Hand: Left    Extremity/Trunk Assessment   Upper Extremity Assessment: Defer to OT evaluation           Lower Extremity Assessment: Generalized weakness         Communication   Communication: No difficulties  Cognition Arousal/Alertness: Awake/alert Behavior During Therapy: WFL for tasks assessed/performed Overall Cognitive Status: Within Functional Limits for tasks assessed                      General Comments      Exercises        Assessment/Plan    PT Assessment Patient needs continued PT services  PT Diagnosis Difficulty walking;Abnormality of gait;Generalized weakness;Acute pain   PT Problem List Decreased strength;Decreased activity tolerance;Decreased  balance;Decreased mobility;Decreased knowledge of use of DME;Decreased knowledge of precautions;Obesity;Pain  PT Treatment Interventions DME instruction;Gait training;Functional mobility training;Therapeutic activities;Patient/family education   PT Goals (Current goals can be found in the Care Plan section) Acute Rehab PT Goals Patient Stated Goal: to go home tomorrow PT Goal Formulation: With patient Time For Goal Achievement:  11/30/15 Potential to Achieve Goals: Good    Frequency Min 5X/week   Barriers to discharge Decreased caregiver support Patient lives alone.  Has prn assist.    Co-evaluation               End of Session Equipment Utilized During Treatment: Gait belt (Sling LUE) Activity Tolerance: Patient limited by fatigue;Patient limited by pain Patient left: in chair;with call bell/phone within reach;with family/visitor present Nurse Communication: Mobility status (Recommend cane and HHPT for d/c)    Functional Assessment Tool Used: Clinical judgement Functional Limitation: Mobility: Walking and moving around Mobility: Walking and Moving Around Current Status [id number]): At least 1 percent but less than 20 percent impaired, limited or restricted Mobility: Walking and Moving Around Goal Status [id number]): 0 percent impaired, limited or restricted    Time: 1454-1515 PT Time Calculation (min) (ACUTE ONLY): 21 min   Charges:   PT Evaluation $Initial PT Evaluation Tier I: 1 Procedure     PT G Codes:   PT G-Codes **NOT FOR INPATIENT CLASS** Functional Assessment Tool Used: Clinical judgement Functional Limitation: Mobility: Walking and moving around Mobility: Walking and Moving Around Current Status [id number]): At least 1 percent but less than 20 percent impaired, limited or restricted Mobility: Walking and Moving Around Goal Status [phone]): 0 percent impaired, limited or restricted    Colleen Gray 11/23/2015, 7:27 PM Colleen Gray. Colleen Gray, [place] Pager [phone]

## 2015-11-24 DIAGNOSIS — M19012 Primary osteoarthritis, left shoulder: Secondary | ICD-10-CM | POA: Diagnosis not present

## 2015-11-24 NOTE — Progress Notes (Signed)
Subjective: 2 Days Post-Op Procedure(s) (LRB): LEFT REVERSE SHOULDER ARTHROPLASTY (Left) Patient reports pain as mild.  Well controlled with pain meds.  No n/v.  Tolerating regular diet.  Objective: Vital signs in last 24 hours: Temp:  [98.6 F (37 C)-100.1 F (37.8 C)] 98.6 F (37 C) (12/03 0520) Pulse Rate:  [79-99] 99 (12/03 0520) Resp:  [18] 18 (12/03 0520) BP: (118-141)/(34-71) 134/71 mmHg (12/03 0520) SpO2:  [97 %-100 %] 97 % (12/03 0520) Weight:  [114.306 kg (252 lb)] 114.306 kg (252 lb) (12/03 0800)  Intake/Output from previous day: 12/02 0701 - 12/03 0700 In: 530 [P.O.:530] Out: -  Intake/Output this shift:    No results for input(s): HGB in the last 72 hours. No results for input(s): WBC, RBC, HCT, PLT in the last 72 hours. No results for input(s): NA, K, CL, CO2, BUN, CREATININE, GLUCOSE, CALCIUM in the last 72 hours. No results for input(s): LABPT, INR in the last 72 hours.  PE:  L shoulder incision dressed and dry. NVI at L UE.  Assessment/Plan: 2 Days Post-Op Procedure(s) (LRB): LEFT REVERSE SHOULDER ARTHROPLASTY (Left) Up with therapy  D/c home.  Wylene Simmer 11/24/2015, 8:58 AM

## 2015-11-24 NOTE — Progress Notes (Signed)
Occupational Therapy Treatment Patient Details Name: Colleen Gray MRN: [id number] DOB: [birth year] Today's Date: 11/24/2015    History of present illness The patient is a 64 y.o. 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 S/P LEFT REVERSE SHOULDER ARTHROPLASTY. PMH: asthma, enlarged heart, HTN, sleep apnea, arithritis, anemia    OT comments  Pt. Seen for skilled OT to review and perform shoulder exercises, precautions, and don/doff sling.  Pt. Able to return demo and maintain precautions during don/doff sling.  D/c likely later today.   Follow Up Recommendations  Other (comment)    Equipment Recommendations  None recommended by OT    Recommendations for Other Services      Precautions / Restrictions Precautions Precautions: Shoulder;Fall Type of Shoulder Precautions: Active protocol: AROM ebow, wrist, hand OK. AROM/PROM shoulder FF 90, ABD 60. NO ER/IR. Shoulder Interventions: Shoulder sling/immobilizer;At all times;Off for dressing/bathing/exercises Precaution Comments: Reviewed precautions Required Braces or Orthoses: Sling Restrictions Weight Bearing Restrictions: No LUE Weight Bearing: Non weight bearing       Mobility Bed Mobility                  Transfers                      Balance                                   ADL Overall ADL's : Needs assistance/impaired Eating/Feeding: Set up   Grooming: Oral care;Applying deodorant;Sitting;Set up Grooming Details (indicate cue type and reason): reviewed one handed techniques and demonstrated compensatory techniques for opening containers ie: stablilizing toothpaste with left hand while un-screwing the cap with right hand   Upper Body Bathing Details (indicate cue type and reason): pt. had multiple questions about how to wash under arms.  reviewed one handed technique while maintaining shoulder restrictions, pt. able to return simulated demo     Upper Body Dressing  : Set up;Cueing for safety;Cueing for sequencing;Cueing for compensatory techniques;Cueing for UE precautions;Sitting Upper Body Dressing Details (indicate cue type and reason): instructed and pt. able to return demo of don/doff sling                 Functional mobility during ADLs: Min guard General ADL Comments: pt. had multiple questions regarding ub selfcare and exercises.  answered all questions       Vision                     Perception     Praxis      Cognition   Behavior During Therapy: [facility] for tasks assessed/performed Overall Cognitive Status: Within Functional Limits for tasks assessed                       Extremity/Trunk Assessment               Exercises Shoulder Exercises Shoulder Flexion: AAROM;Left;10 reps;Seated Shoulder ABduction: AAROM;Left;10 reps;Seated Elbow Flexion: AROM;Left;10 reps;Seated Wrist Flexion: AROM;Left;10 reps;Seated Digit Composite Flexion: AROM;Left;10 reps;Seated Neck Flexion: AROM;5 reps;Seated Neck Extension: AROM;5 reps;Seated Neck Lateral Flexion - Right: AROM;5 reps;Seated Neck Lateral Flexion - Left: AROM;5 reps;Seated Donning/doffing sling/immobilizer: Minimal assistance Correct positioning of sling/immobilizer: Minimal assistance Sling wearing schedule (on at all times/off for ADL's): Supervision/safety   Shoulder Instructions Shoulder Instructions Donning/doffing sling/immobilizer: Minimal assistance Correct positioning of sling/immobilizer: Minimal assistance Sling wearing schedule (on at all  times/off for ADL's): Supervision/safety     General Comments      Pertinent Vitals/ Pain       Pain Assessment: 0-10 Pain Score: 6  Pain Location: left shoulder Pain Descriptors / Indicators: Aching Pain Intervention(s): Limited activity within patient's tolerance;Monitored during session;Premedicated before session;Repositioned;Ice applied  Home Living                                           Prior Functioning/Environment              Frequency Min 3X/week     Progress Toward Goals  OT Goals(current goals can now be found in the care plan section)  Progress towards OT goals: Progressing toward goals     Plan Discharge plan remains appropriate    Co-evaluation                 End of Session     Activity Tolerance Patient tolerated treatment well   Patient Left in bed;with call bell/phone within reach   Nurse Communication          Time: [id number] OT Time Calculation (min): 30 min  Charges: OT General Charges $OT Visit: 1 Procedure OT Treatments $Self Care/Home Management : 8-22 mins $Therapeutic Exercise: 8-22 mins  Janice Coffin, COTA/L 11/24/2015, 9:31 AM

## 2015-11-27 ENCOUNTER — Ambulatory Visit: Payer: Federal, State, Local not specified - PPO

## 2015-11-27 ENCOUNTER — Other Ambulatory Visit: Payer: Federal, State, Local not specified - PPO

## 2015-12-06 ENCOUNTER — Ambulatory Visit: Payer: Federal, State, Local not specified - PPO | Admitting: Nurse Practitioner

## 2015-12-07 ENCOUNTER — Ambulatory Visit: Payer: Federal, State, Local not specified - PPO | Admitting: Nurse Practitioner

## 2015-12-23 DIAGNOSIS — N184 Chronic kidney disease, stage 4 (severe): Secondary | ICD-10-CM

## 2015-12-23 HISTORY — DX: Chronic kidney disease, stage 4 (severe): N18.4

## 2015-12-26 NOTE — Discharge Summary (Signed)
PATIENT ID:      Zaidah Brisky  MRN:     [id number] DOB/AGE:    Sep 22, 1951 / 65 y.o.     DISCHARGE SUMMARY  ADMISSION DATE:    11/22/2015 DISCHARGE DATE:  11/24/2015  ADMISSION DIAGNOSIS: LEFT SHOULDER ROTATOR CUFF TEAR, ARTHROPATHY Past Medical History  Diagnosis Date  . Asthma   . History of rheumatic fever     with a heart murmer  . Ovarian cyst 1985    removed  . Hemorrhoid   . Bradycardia   . Heart murmur     due to rheumatic fever  . Enlarged heart     Sees Dr. Einar Gip  . Hypertension   . Sleep apnea     does not use Cpap (after weight loss no longer needed)  . Pneumonia   . Arthritis   . Anemia     DISCHARGE DIAGNOSIS:   Active Problems:   S/P shoulder replacement   PROCEDURE: Procedure(s): LEFT REVERSE SHOULDER ARTHROPLASTY on 11/22/2015  CONSULTS:   none  HISTORY:  See H&P in chart.  HOSPITAL COURSE:  Janelle Goldsberry is a 65 y.o. admitted on 11/22/2015 with a chief complaint of severe left shoulder pain and dysfunction, and found to have a diagnosis of Titus.  They were brought to the operating room on 11/22/2015 and underwent Procedure(s): LEFT REVERSE SHOULDER ARTHROPLASTY.    They were given perioperative antibiotics:  Anti-infectives    Start     Dose/Rate Route Frequency Ordered Stop   11/22/15 1400  ceFAZolin (ANCEF) IVPB 2 g/50 mL premix     2 g 100 mL/hr over 30 Minutes Intravenous Every 6 hours 11/22/15 1122 11/23/15 0457   11/22/15 0700  ceFAZolin (ANCEF) IVPB 2 g/50 mL premix     2 g 100 mL/hr over 30 Minutes Intravenous To [facility]Stay [facility] 11/21/15 1259 11/22/15 0805    .  Patient underwent the above named procedure and tolerated it well. The following day they were hemodynamically stable and pain was controlled on oral analgesics. They were neurovascularly intact to the operative extremity. OT was ordered and worked with patient per protocol. She was kept an additional day for pain control and to increase  independence with mobility. Home health services were felt necessary and arranged. They were medically and orthopaedically stable for discharge on 11/24/2015.    DIAGNOSTIC STUDIES:  RECENT RADIOGRAPHIC STUDIES :  No results found.  RECENT VITAL SIGNS:  No data found. Marland Kitchen  RECENT EKG RESULTS:    Orders placed or performed during the hospital encounter of 02/09/15  . EKG 12-Lead  . EKG 12-Lead  . ED EKG (<32mins upon arrival to the ED)  . ED EKG (<10mins upon arrival to the ED)  . EKG    DISCHARGE INSTRUCTIONS:  Discharge Instructions    Call MD / Call 911    Complete by:  As directed   If you experience chest pain or shortness of breath, CALL 911 and be transported to the hospital emergency room.  If you develope a fever above 101 F, pus (white drainage) or increased drainage or redness at the wound, or calf pain, call your surgeon's office.     Constipation Prevention    Complete by:  As directed   Drink plenty of fluids.  Prune juice may be helpful.  You may use a stool softener, such as Colace (over the counter) 100 mg twice a day.  Use MiraLax (over the counter) for constipation as needed.  Diet - low sodium heart healthy    Complete by:  As directed      Increase activity slowly as tolerated    Complete by:  As directed            DISCHARGE MEDICATIONS:     Medication List    TAKE these medications        Alpha-Lipoic Acid 200 MG Tabs  Take 1 tablet by mouth daily.     aspirin 81 MG tablet  Take 81 mg by mouth 3 (three) times a week. No specific days     BELVIQ 10 MG Tabs  Generic drug:  Lorcaserin HCl  Take 1 tablet by mouth 2 (two) times daily.     CALCIUM 1200+D3 PO  Take 1 tablet by mouth daily.     EUFLEXXA 20 MG/2ML Sosy  Generic drug:  Sodium Hyaluronate  20 mg every 6 (six) months. Last dose was in July 2016     HYDROcodone-acetaminophen 5-325 MG tablet  Commonly known as:  NORCO  Take 1-2 tablets by mouth every 4 (four) hours as needed.      lisinopril 20 MG tablet  Commonly known as:  PRINIVIL,ZESTRIL  Take 20 mg by mouth daily.     methocarbamol 500 MG tablet  Commonly known as:  ROBAXIN  Take 1 tablet (500 mg total) by mouth every 8 (eight) hours as needed for muscle spasms.     multivitamin with minerals Tabs tablet  Take 1 tablet by mouth daily.     omega-3 acid ethyl esters 1 g capsule  Commonly known as:  LOVAZA  Take 1 g by mouth 2 (two) times daily.     PENNSAID 2 % Soln  Generic drug:  Diclofenac Sodium  Place 1 spray onto the skin daily as needed. For the shoulder     PRESCRIPTION MEDICATION  Apply 1-2 Squirts topically every 3 (three) hours as needed (Shoulder pain). Diclofenac 3%, Baclofen 2%, Lidocaine 5%, 1% menthol     PROAIR HFA 108 (90 Base) MCG/ACT inhaler  Generic drug:  albuterol  Inhale 2 puffs into the lungs every 4 (four) hours as needed for shortness of breath.     SUPER GREENS PO  Take 2 capsules by mouth daily.     TURMERIC CURCUMIN PO  Take 1 capsule by mouth daily.     vitamin B-12 100 MCG tablet  Commonly known as:  CYANOCOBALAMIN  Take 50 mcg by mouth daily.     vitamin C 500 MG tablet  Commonly known as:  ASCORBIC ACID  Take 500 mg by mouth daily.        FOLLOW UP VISIT:       Follow-up Information    Follow up with Metta Clines SUPPLE, MD.   Specialty:  Orthopedic Surgery   Why:  call to be seen in 10-14 days   Contact information:   [address] [place] [address] [phone]       Follow up with [facility].   Why:  They will contact you to schedule home therapy visits.   Contact information:   [place] [id number] [phone]       DISCHARGE TO: Home   DISPOSITION: Good  DISCHARGE CONDITION:  Good   Lanyiah Brix for Dr. Justice Britain 12/26/2015, 3:48 PM

## 2016-02-05 ENCOUNTER — Other Ambulatory Visit: Payer: Self-pay

## 2016-02-05 DIAGNOSIS — Z1231 Encounter for screening mammogram for malignant neoplasm of breast: Secondary | ICD-10-CM

## 2016-02-06 ENCOUNTER — Ambulatory Visit (INDEPENDENT_AMBULATORY_CARE_PROVIDER_SITE_OTHER): Payer: Federal, State, Local not specified - PPO | Admitting: Neurology

## 2016-02-06 ENCOUNTER — Ambulatory Visit (INDEPENDENT_AMBULATORY_CARE_PROVIDER_SITE_OTHER): Payer: Self-pay | Admitting: Neurology

## 2016-02-06 ENCOUNTER — Encounter: Payer: Self-pay | Admitting: Neurology

## 2016-02-06 DIAGNOSIS — G5603 Carpal tunnel syndrome, bilateral upper limbs: Secondary | ICD-10-CM

## 2016-02-06 DIAGNOSIS — G5602 Carpal tunnel syndrome, left upper limb: Secondary | ICD-10-CM

## 2016-02-06 DIAGNOSIS — G5601 Carpal tunnel syndrome, right upper limb: Secondary | ICD-10-CM

## 2016-02-06 DIAGNOSIS — G5600 Carpal tunnel syndrome, unspecified upper limb: Secondary | ICD-10-CM | POA: Insufficient documentation

## 2016-02-06 NOTE — Procedures (Signed)
     HISTORY:  Colleen Gray is a 65 year old patient with a history of obesity and borderline diabetes. The patient has had recent left shoulder surgery, she reports onset of numbness of the hands, right greater than left since December 2016. The patient is being evaluated for a possible neuropathy or a cervical radiculopathy. The patient denies neck discomfort.  NERVE CONDUCTION STUDIES:  Nerve conduction studies were performed on both upper extremities. The distal motor latencies for the median nerves were prolonged bilaterally, right greater than left, with a low motor amplitude on the right, normal on the left. The distal motor latencies and motor amplitudes for the ulnar nerves were normal bilaterally. The F wave latencies were prolonged for the right median nerve, normal on the left, and normal for the ulnar nerves bilaterally. The nerve conduction velocities for the median and ulnar nerves were normal bilaterally. The sensory latencies for the median nerves were prolonged bilaterally, normal for the ulnar nerves bilaterally.  EMG STUDIES:  EMG study was performed on the right upper extremity:  The first dorsal interosseous muscle reveals 2 to 4 K units with full recruitment. No fibrillations or positive waves were noted. The abductor pollicis brevis muscle reveals 2 to 5 K units with decreased recruitment. No fibrillations or positive waves were noted. The extensor indicis proprius muscle reveals 1 to 3 K units with full recruitment. No fibrillations or positive waves were noted. The pronator teres muscle reveals 2 to 3 K units with full recruitment. No fibrillations or positive waves were noted. The biceps muscle reveals 1 to 2 K units with full recruitment. No fibrillations or positive waves were noted. The triceps muscle reveals 2 to 4 K units with full recruitment. No fibrillations or positive waves were noted. The anterior deltoid muscle reveals 2 to 3 K units with full recruitment.  No fibrillations or positive waves were noted. The cervical paraspinal muscles were tested at 2 levels. No abnormalities of insertional activity were seen at either level tested. There was fair relaxation.   IMPRESSION:  Nerve conduction studies done on both upper extremities shows evidence of bilateral carpal tunnel syndrome of moderate severity on the right, mild severity on the left. EMG evaluation of the right upper extremity shows chronic stable denervation in the APB muscle consistent with the diagnosis of carpal tunnel syndrome. There is no evidence of an overlying cervical radiculopathy.  Jill Alexanders MD 02/06/2016 1:42 PM  [facility] [address] [place], [place] [address]  Phone [phone] Fax [phone]

## 2016-02-06 NOTE — Progress Notes (Signed)
Please refer to EMG and nerve conduction study procedure note. 

## 2016-02-20 ENCOUNTER — Ambulatory Visit
Admission: RE | Admit: 2016-02-20 | Discharge: 2016-02-20 | Disposition: A | Discharge status: Home / Self Care | Payer: Federal, State, Local not specified - PPO | Source: Ambulatory Visit

## 2016-02-20 DIAGNOSIS — Z1231 Encounter for screening mammogram for malignant neoplasm of breast: Secondary | ICD-10-CM

## 2016-02-27 ENCOUNTER — Ambulatory Visit (INDEPENDENT_AMBULATORY_CARE_PROVIDER_SITE_OTHER): Payer: Federal, State, Local not specified - PPO | Admitting: Nurse Practitioner

## 2016-02-27 ENCOUNTER — Encounter: Payer: Self-pay | Admitting: Nurse Practitioner

## 2016-02-27 VITALS — BP 104/70 | HR 60 | Resp 14 | Ht 62.5 in | Wt 265.0 lb

## 2016-02-27 DIAGNOSIS — Z01419 Encounter for gynecological examination (general) (routine) without abnormal findings: Secondary | ICD-10-CM

## 2016-02-27 DIAGNOSIS — Z0000 Encounter for general adult medical examination without abnormal findings: Secondary | ICD-10-CM | POA: Diagnosis not present

## 2016-02-27 NOTE — Progress Notes (Signed)
Patient ID: Colleen Gray, female   DOB: April 06, 1951, 65 y.o.   MRN: [id number] 65 y.o. G1P0010 Widowed  African American Fe here for annual exam.  Since last here no vaginal bleeding or spotting.  She had episode of PMB 10/2014 with PUS 11/2014 showing uterine fibroids and unable to get Endo biopsy.  She has done well since then.   Left shoulder replacement 11/22/2015 and has better ROM.  Patient's last menstrual period was 12/23/2007 (approximate).          Sexually active: Yes.    The current method of family planning is post menopausal status.    Exercising: No.  The patient does not participate in regular exercise at present. Smoker:  no  Health Maintenance: Pap:  11-30-14 WNL  MMG:  02-22-16 WNL BI RADS 1 Colonoscopy:  05-25-15 WNL recheck in 5 yrs BMD:   02/26/16 waiting for results TDaP:  02/26/16 Shingles: 02/26/16 Pneumonia: 11-21-14 Hep C and HIV: done PCP Labs: PCP does labs    reports that she has never smoked. She has never used smokeless tobacco. She reports that she does not drink alcohol or use illicit drugs.  Past Medical History  Diagnosis Date  . Asthma   . History of rheumatic fever     with a heart murmer  . Ovarian cyst 1985    removed  . Hemorrhoid   . Bradycardia   . Heart murmur     due to rheumatic fever  . Enlarged heart     Sees Dr. Einar Gip  . Hypertension   . Sleep apnea     does not use Cpap (after weight loss no longer needed)  . Pneumonia   . Arthritis   . Anemia     Past Surgical History  Procedure Laterality Date  . Bunionectomy with hammertoe reconstruction Bilateral [id number]; 1999; 2009     left; right; left 'w/plates and screws"  . Rotator cuff repair Right 2006  . Laparoscopic gastric bypass  2007  . Laparoscopic ovarian cystectomy Right 1985  . Dilation and curettage of uterus  early 1980's    for missed AB  . Colonoscopy    . Joint replacement    . Reverse shoulder arthroplasty Left 11/22/2015  . Reverse shoulder arthroplasty Left 11/22/2015     Procedure: LEFT REVERSE SHOULDER ARTHROPLASTY;  Surgeon: Justice Britain, MD;  Location: [facility];  Service: Orthopedics;  Laterality: Left;    Current Outpatient Prescriptions  Medication Sig Dispense Refill  . albuterol (PROAIR HFA) 108 (90 BASE) MCG/ACT inhaler Inhale 2 puffs into the lungs every 4 (four) hours as needed for shortness of breath.     . Alpha-Lipoic Acid 200 MG TABS Take 1 tablet by mouth daily.    Marland Kitchen aspirin 81 MG tablet Take 81 mg by mouth 3 (three) times a week. No specific days    . Calcium-Magnesium-Vitamin D (CALCIUM 1200+D3 PO) Take 1 tablet by mouth daily.    . Diclofenac Sodium (PENNSAID) 2 % SOLN Place 1 spray onto the skin daily as needed. For the shoulder    . EUFLEXXA 20 MG/2ML SOSY 20 mg every 6 (six) months. Last dose was in July 2016    . Liraglutide -Weight Management (SAXENDA) 18 MG/3ML SOPN Inject 3 mg into the skin.    Marland Kitchen lisinopril (PRINIVIL,ZESTRIL) 20 MG tablet Take 20 mg by mouth daily.  1  . Misc Natural Products (SUPER GREENS PO) Take 2 capsules by mouth daily.    . Multiple Vitamin (MULTIVITAMIN WITH  MINERALS) TABS tablet Take 1 tablet by mouth daily.    Marland Kitchen omega-3 acid ethyl esters (LOVAZA) 1 G capsule Take 1 g by mouth 2 (two) times daily.    Marland Kitchen PRESCRIPTION MEDICATION Apply 1-2 Squirts topically every 3 (three) hours as needed (Shoulder pain). Diclofenac 3%, Baclofen 2%, Lidocaine 5%, 1% menthol    . TURMERIC CURCUMIN PO Take 1 capsule by mouth daily.    . vitamin B-12 (CYANOCOBALAMIN) 100 MCG tablet Take 50 mcg by mouth daily.    . vitamin C (ASCORBIC ACID) 500 MG tablet Take 500 mg by mouth daily.     No current facility-administered medications for this visit.    Family History  Problem Relation Age of Onset  . Colon cancer Mother 25  . Diabetes Mother   . Hypertension Mother   . Diabetes Father   . Hypertension Father   . Colon cancer Maternal Grandmother 96  . Diabetes Paternal Grandmother   . Diabetes Paternal Grandfather     ROS:   Pertinent items are noted in HPI.  Otherwise, a comprehensive ROS was negative.  Exam:   BP 104/70 mmHg  Pulse 60  Resp 14  Ht 5' 2.5" (1.588 m)  Wt 265 lb (120.203 kg)  BMI 47.67 kg/m2  LMP 12/23/2007 (Approximate) Height: 5' 2.5" (158.8 cm) Ht Readings from Last 3 Encounters:  02/27/16 5' 2.5" (1.588 m)  11/24/15 5\' 3"  (1.6 m)  11/19/15 5\' 3"  (1.6 m)    General appearance: alert, cooperative and appears stated age Head: Normocephalic, without obvious abnormality, atraumatic Neck: no adenopathy, supple, symmetrical, trachea midline and thyroid normal to inspection and palpation Lungs: clear to auscultation bilaterally Breasts: normal appearance, no masses or tenderness Heart: regular rate and rhythm Abdomen: soft, non-tender; no masses,  no organomegaly Extremities: extremities normal, atraumatic, no cyanosis or edema Skin: Skin color, texture, turgor normal. No rashes or lesions Lymph nodes: Cervical, supraclavicular, and axillary nodes normal. No abnormal inguinal nodes palpated Neurologic: Grossly normal   Pelvic: External genitalia:  no lesions              Urethra:  normal appearing urethra with no masses, tenderness or lesions              Bartholin's and Skene's: normal                 Vagina: normal appearing vagina with normal color and discharge, no lesions              Cervix: anteverted              Pap taken: No. Bimanual Exam:  Uterus:  normal size, contour, position, consistency, mobility, non-tender              Adnexa: no mass, fullness, tenderness limited due to body habitus               Rectovaginal: Confirms               Anus:  normal sphincter tone, no lesions  Chaperone present: no  A:  Well Woman with normal exam  Postmenopausal with PMB 11/08/14 along with abdominal bloating with negative PUS and normal CA 125 History of Rheumatic heart disease S/P right OV Cystectomy 1985 [facility] of colon cancer with Mother and  MGM S/P Gastric bypass 2007    P:   Reviewed health and wellness pertinent to exam  Pap smear as above  mammogram counseled on breast self exam, mammography screening, adequate intake of  calcium and vitamin D, diet and exercise, Kegel's exercises return annually or prn  An After Visit Summary was printed and given to the patient.

## 2016-02-27 NOTE — Patient Instructions (Signed)

## 2016-03-02 NOTE — Progress Notes (Signed)
Encounter reviewed by Dr. Metztli Sachdev Amundson C. Silva.  

## 2016-03-06 ENCOUNTER — Encounter: Payer: Federal, State, Local not specified - PPO | Admitting: Neurology

## 2016-04-03 ENCOUNTER — Encounter: Payer: Self-pay | Admitting: Podiatry

## 2016-04-03 ENCOUNTER — Ambulatory Visit (INDEPENDENT_AMBULATORY_CARE_PROVIDER_SITE_OTHER): Payer: Federal, State, Local not specified - PPO | Admitting: Podiatry

## 2016-04-03 VITALS — BP 129/69 | HR 60 | Resp 12

## 2016-04-03 DIAGNOSIS — M2042 Other hammer toe(s) (acquired), left foot: Secondary | ICD-10-CM

## 2016-04-03 DIAGNOSIS — B351 Tinea unguium: Secondary | ICD-10-CM | POA: Diagnosis not present

## 2016-04-03 NOTE — Progress Notes (Signed)
   Subjective:    Patient ID: Colleen Gray, female    DOB: 1951-01-13, 64 y.o.   MRN: [id number]  HPI  PT STATED LT FOOT GREAT TOENAIL HAVE DISCOLORATION FOR 1 WEEK BUT DOES NOT HURT. TOENAIL IS LOOKING WORSE AND TRIED NO TREATMENT.  Review of Systems  Skin: Positive for color change.       Objective:   Physical Exam        Assessment & Plan:

## 2016-04-06 NOTE — Progress Notes (Signed)
Subjective:     Patient ID: Colleen Gray, female   DOB: Nov 16, 1951, 65 y.o.   MRN: [id number]  HPI patient presents with concerns about the left fifth nail being black and discolored. Stated she traumatized it week ago   Review of Systems     Objective:   Physical Exam Neurovascular status intact muscle strength adequate range of motion within normal limits with patient found to have moderate damage left hallux nail with no proximal trauma edema or redness    Assessment:     Traumatized left hallux nail with discoloration of the proximal one half    Plan:     H&P condition reviewed with patient and recommended continued soaks and padding of the area but I do not think it needs to be removed and less it were to start draining or become loose

## 2016-09-11 ENCOUNTER — Other Ambulatory Visit: Payer: Self-pay | Admitting: Nephrology

## 2016-09-11 DIAGNOSIS — N179 Acute kidney failure, unspecified: Secondary | ICD-10-CM

## 2016-09-19 ENCOUNTER — Ambulatory Visit
Admission: RE | Admit: 2016-09-19 | Discharge: 2016-09-19 | Disposition: A | Discharge status: Home / Self Care | Payer: Federal, State, Local not specified - PPO | Source: Ambulatory Visit | Attending: Nephrology | Admitting: Nephrology

## 2016-09-19 DIAGNOSIS — N179 Acute kidney failure, unspecified: Secondary | ICD-10-CM

## 2016-09-30 ENCOUNTER — Other Ambulatory Visit (HOSPITAL_COMMUNITY): Payer: Self-pay | Admitting: Nephrology

## 2016-09-30 DIAGNOSIS — N184 Chronic kidney disease, stage 4 (severe): Secondary | ICD-10-CM

## 2016-10-10 ENCOUNTER — Other Ambulatory Visit: Payer: Self-pay | Admitting: Radiology

## 2016-10-13 ENCOUNTER — Other Ambulatory Visit (HOSPITAL_COMMUNITY): Payer: Self-pay | Admitting: Nephrology

## 2016-10-13 ENCOUNTER — Ambulatory Visit (HOSPITAL_COMMUNITY)
Admission: RE | Admit: 2016-10-13 | Discharge: 2016-10-13 | Disposition: A | Discharge status: Home / Self Care | Payer: Federal, State, Local not specified - PPO | Source: Ambulatory Visit | Attending: Nephrology | Admitting: Nephrology

## 2016-10-13 ENCOUNTER — Encounter (HOSPITAL_COMMUNITY): Payer: Self-pay

## 2016-10-13 DIAGNOSIS — D649 Anemia, unspecified: Secondary | ICD-10-CM | POA: Diagnosis not present

## 2016-10-13 DIAGNOSIS — M1990 Unspecified osteoarthritis, unspecified site: Secondary | ICD-10-CM | POA: Diagnosis not present

## 2016-10-13 DIAGNOSIS — I517 Cardiomegaly: Secondary | ICD-10-CM | POA: Diagnosis not present

## 2016-10-13 DIAGNOSIS — J45909 Unspecified asthma, uncomplicated: Secondary | ICD-10-CM | POA: Diagnosis not present

## 2016-10-13 DIAGNOSIS — Z9889 Other specified postprocedural states: Secondary | ICD-10-CM | POA: Insufficient documentation

## 2016-10-13 DIAGNOSIS — N289 Disorder of kidney and ureter, unspecified: Secondary | ICD-10-CM

## 2016-10-13 DIAGNOSIS — Z79899 Other long term (current) drug therapy: Secondary | ICD-10-CM | POA: Insufficient documentation

## 2016-10-13 DIAGNOSIS — Z8701 Personal history of pneumonia (recurrent): Secondary | ICD-10-CM | POA: Diagnosis not present

## 2016-10-13 DIAGNOSIS — N184 Chronic kidney disease, stage 4 (severe): Secondary | ICD-10-CM

## 2016-10-13 DIAGNOSIS — R001 Bradycardia, unspecified: Secondary | ICD-10-CM | POA: Diagnosis not present

## 2016-10-13 DIAGNOSIS — I10 Essential (primary) hypertension: Secondary | ICD-10-CM | POA: Diagnosis not present

## 2016-10-13 DIAGNOSIS — Z880 Allergy status to penicillin: Secondary | ICD-10-CM | POA: Insufficient documentation

## 2016-10-13 DIAGNOSIS — R011 Cardiac murmur, unspecified: Secondary | ICD-10-CM | POA: Diagnosis not present

## 2016-10-13 LAB — BASIC METABOLIC PANEL
Anion gap: 9 (ref 5–15)
BUN: 33 mg/dL — ABNORMAL HIGH (ref 6–20)
CO2: 25 mmol/L (ref 22–32)
Calcium: 8.1 mg/dL — ABNORMAL LOW (ref 8.9–10.3)
Chloride: 108 mmol/L (ref 101–111)
Creatinine, Ser: 2.61 mg/dL — ABNORMAL HIGH (ref 0.44–1.00)
GFR calc Af Amer: 21 mL/min — ABNORMAL LOW (ref >60)
GFR calc non Af Amer: 18 mL/min — ABNORMAL LOW (ref >60)
Glucose, Bld: 137 mg/dL — ABNORMAL HIGH (ref 65–99)
Potassium: 3.5 mmol/L (ref 3.5–5.1)
Sodium: 142 mmol/L (ref 135–145)

## 2016-10-13 LAB — CBC WITH DIFFERENTIAL/PLATELET
Basophils Absolute: 0 10*3/uL (ref 0.0–0.1)
Basophils Relative: 1 %
Eosinophils Absolute: 0.3 10*3/uL (ref 0.0–0.7)
Eosinophils Relative: 7 %
HCT: 34.6 % — ABNORMAL LOW (ref 36.0–46.0)
Hemoglobin: 11 g/dL — ABNORMAL LOW (ref 12.0–15.0)
Lymphocytes Relative: 33 %
Lymphs Abs: 1.6 10*3/uL (ref 0.7–4.0)
MCH: 27.8 pg (ref 26.0–34.0)
MCHC: 31.8 g/dL (ref 30.0–36.0)
MCV: 87.6 fL (ref 78.0–100.0)
Monocytes Absolute: 0.3 10*3/uL (ref 0.1–1.0)
Monocytes Relative: 6 %
Neutro Abs: 2.7 10*3/uL (ref 1.7–7.7)
Neutrophils Relative %: 53 %
Platelets: 224 10*3/uL (ref 150–400)
RBC: 3.95 MIL/uL (ref 3.87–5.11)
RDW: 13.7 % (ref 11.5–15.5)
WBC: 4.9 10*3/uL (ref 4.0–10.5)

## 2016-10-13 LAB — TYPE AND SCREEN
ABO/RH(D): O POS
Antibody Screen: NEGATIVE

## 2016-10-13 LAB — PROTIME-INR
INR: 0.98
Prothrombin Time: 13 seconds (ref 11.4–15.2)

## 2016-10-13 LAB — ABO/RH: ABO/RH(D): O POS

## 2016-10-13 MED ORDER — FENTANYL CITRATE (PF) 100 MCG/2ML IJ SOLN: 50.0000 ug | Freq: Once | INTRAMUSCULAR | Status: DC

## 2016-10-13 MED ORDER — MIDAZOLAM HCL 2 MG/2ML IJ SOLN
INTRAMUSCULAR | Status: AC
  Administered 2016-10-13: 1 mg
  Filled 2016-10-13: qty 2

## 2016-10-13 MED ORDER — FENTANYL CITRATE (PF) 100 MCG/2ML IJ SOLN
50.0000 ug | Freq: Once | INTRAMUSCULAR | Status: AC
Start: 2016-10-13 — End: 2016-10-13
  Administered 2016-10-13: 50 ug via INTRAVENOUS
  Filled 2016-10-13: qty 1

## 2016-10-13 MED ORDER — LIDOCAINE HCL 1 % IJ SOLN
INTRAMUSCULAR | Status: AC
  Filled 2016-10-13: qty 20

## 2016-10-13 MED ORDER — FENTANYL CITRATE (PF) 100 MCG/2ML IJ SOLN
INTRAMUSCULAR | Status: AC
  Administered 2016-10-13: 50 ug
  Filled 2016-10-13: qty 2

## 2016-10-13 MED ORDER — SODIUM CHLORIDE 0.9 % IV SOLN
INTRAVENOUS | Status: DC
  Administered 2016-10-13: 07:00:00 via INTRAVENOUS

## 2016-10-13 MED ORDER — MIDAZOLAM HCL 2 MG/2ML IJ SOLN: 1.0000 mg | Freq: Once | INTRAMUSCULAR | Status: DC

## 2016-10-13 MED ORDER — MIDAZOLAM HCL 2 MG/2ML IJ SOLN
1.0000 mg | Freq: Once | INTRAMUSCULAR | Status: AC
  Administered 2016-10-13: 1 mg via INTRAVENOUS
  Filled 2016-10-13: qty 1

## 2016-10-13 NOTE — Progress Notes (Signed)
Pt. Prepared for US renal bx.

## 2016-10-13 NOTE — Discharge Instructions (Signed)
Needle Biopsy, Care After °Refer to this sheet in the next few weeks. These instructions provide you with information about caring for yourself after your procedure. Your health care provider may also give you more specific instructions. Your treatment has been planned according to current medical practices, but problems sometimes occur. Call your health care provider if you have any problems or questions after your procedure. °WHAT TO EXPECT AFTER THE PROCEDURE °After your procedure, it is common to have soreness, bruising, or mild pain at the biopsy site. This should go away in a few days. °HOME CARE INSTRUCTIONS °· Rest as directed by your health care provider. °· Take medicines only as directed by your health care provider. °· There are many different ways to close and cover the biopsy site, including stitches (sutures), skin glue, and adhesive strips. Follow your health care provider's instructions about: °¨ Biopsy site care. °¨ Bandage (dressing) changes and removal. °¨ Biopsy site closure removal. °· Check your biopsy site every day for signs of infection. Watch for: °¨ Redness, swelling, or pain. °¨ Fluid, blood, or pus. °SEEK MEDICAL CARE IF: °· You have a fever. °· You have redness, swelling, or pain at the biopsy site that lasts longer than a few days. °· You have fluid, blood, or pus coming from the biopsy site. °· You feel nauseous. °· You vomit. °SEEK IMMEDIATE MEDICAL CARE IF: °· You have shortness of breath. °· You have trouble breathing. °· You have chest pain.   °· You feel dizzy or you faint. °· You have bleeding that does not stop with pressure or a bandage. °· You cough up blood. °· You have pain in your abdomen. °  °This information is not intended to replace advice given to you by your health care provider. Make sure you discuss any questions you have with your health care provider. °  °Document Released: 04/24/2015 Document Reviewed: 04/24/2015 °Elsevier Interactive Patient Education ©2016  [employer] ° °

## 2016-10-13 NOTE — Progress Notes (Signed)
Pt. Remains on CT table.

## 2016-10-13 NOTE — Progress Notes (Signed)
Moving to CT Pt. AxOx3

## 2016-10-13 NOTE — Procedures (Signed)
Attempted [place] and CT guided renal biopsy.  Biopsy was aborted due to body habitus and will need different needle set in order to reach kidney.   No immediate complication.

## 2016-10-13 NOTE — Progress Notes (Signed)
co2 29% Pt. Resting.

## 2016-10-13 NOTE — Progress Notes (Signed)
Procedure aborted per Dr Anselm Pancoast d/t pt size

## 2016-10-13 NOTE — H&P (Signed)
Chief Complaint: Patient was seen in consultation today for random renal biopsy at the request of [place]  Referring Physician(s): Upton,Elizabeth  Supervising Physician: Markus Daft  Patient Status: [facility] - Out-pt  History of Present Illness: Colleen Gray is a 65 y.o. female being worked up for renal insufficiency. She is referred for US guided random renal biopsy PMHx, emds, labs, imaging, allergies reviewed. Has been NPO except her meds this am. Feels good otherwise.   Past Medical History:  Diagnosis Date  . Anemia   . Arthritis   . Asthma   . Bradycardia   . Enlarged heart    Sees Dr. Einar Gip  . Heart murmur    due to rheumatic fever  . Hemorrhoid   . History of rheumatic fever    with a heart murmer  . Hypertension   . Ovarian cyst 1985   removed  . Pneumonia   . Sleep apnea    does not use Cpap (after weight loss no longer needed)    Past Surgical History:  Procedure Laterality Date  . BUNIONECTOMY WITH HAMMERTOE RECONSTRUCTION Bilateral 1980's; 1999; 2009    left; right; left 'w/plates and screws"  . COLONOSCOPY    . DILATION AND CURETTAGE OF UTERUS  early 1980's   for missed AB  . JOINT REPLACEMENT    . LAPAROSCOPIC GASTRIC BYPASS  2007  . LAPAROSCOPIC OVARIAN CYSTECTOMY Right 1985  . REVERSE SHOULDER ARTHROPLASTY Left 11/22/2015  . REVERSE SHOULDER ARTHROPLASTY Left 11/22/2015   Procedure: LEFT REVERSE SHOULDER ARTHROPLASTY;  Surgeon: Justice Britain, MD;  Location: [facility];  Service: Orthopedics;  Laterality: Left;  . ROTATOR CUFF REPAIR Right 2006    Allergies: Penicillins  Medications: Prior to Admission medications   Medication Sig Start Date End Date Taking? Authorizing Provider  acetaminophen (TYLENOL) 500 MG tablet Take 500 mg by mouth 3 (three) times daily as needed.   Yes Historical Provider, MD  Alpha-Lipoic Acid 200 MG TABS Take 1 tablet by mouth daily.   Yes Historical Provider, MD  aspirin 81 MG tablet Take 81 mg by mouth 3  (three) times a week. No specific days   Yes Historical Provider, MD  Calcium-Magnesium-Vitamin D (CALCIUM 1200+D3 PO) Take 1 tablet by mouth daily.   Yes Historical Provider, MD  EUFLEXXA 20 MG/2ML SOSY 20 mg every 6 (six) months. Last dose was in MARCH 2017 11/20/14  Yes Historical Provider, MD  Misc Natural Products (SUPER GREENS PO) Take 2 capsules by mouth daily.   Yes Historical Provider, MD  Multiple Vitamin (MULTIVITAMIN WITH MINERALS) TABS tablet Take 1 tablet by mouth daily.   Yes Historical Provider, MD  NIFEdipine (PROCARDIA XL/ADALAT-CC) 60 MG 24 hr tablet Take 60 mg by mouth daily.   Yes Historical Provider, MD  Omega 3-6-9 Fatty Acids (OMEGA-3-6-9 PO) Take 1 capsule by mouth daily.   Yes Historical Provider, MD  TURMERIC CURCUMIN PO Take 1 capsule by mouth daily.   Yes Historical Provider, MD  vitamin B-12 (CYANOCOBALAMIN) 100 MCG tablet Take 50 mcg by mouth daily.   Yes Historical Provider, MD  vitamin C (ASCORBIC ACID) 500 MG tablet Take 500 mg by mouth daily.   Yes Historical Provider, MD  albuterol (PROAIR HFA) 108 (90 BASE) MCG/ACT inhaler Inhale 2 puffs into the lungs every 4 (four) hours as needed for shortness of breath.  10/22/13   Historical Provider, MD     Family History  Problem Relation Age of Onset  . Colon cancer Mother 46  .  Diabetes Mother   . Hypertension Mother   . Diabetes Father   . Hypertension Father   . Colon cancer Maternal Grandmother 96  . Diabetes Paternal Grandmother   . Diabetes Paternal Grandfather     Social History   Social History  . Marital status: Widowed    Spouse name: N/A  . Number of children: 0  . Years of education: N/A   Social History Main Topics  . Smoking status: Never Smoker  . Smokeless tobacco: Never Used  . Alcohol use No  . Drug use: No  . Sexual activity: Yes    Partners: Male    Birth control/ protection: Post-menopausal   Other Topics Concern  . Not on file   Social History Narrative  . No narrative on  file      Review of Systems: A 12 point ROS discussed and pertinent positives are indicated in the HPI above.  All other systems are negative.  Review of Systems  Vital Signs: BP (!) 151/83   Pulse 86   Temp 98.4 F (36.9 C)   Resp 18   Ht 5\' 3"  (1.6 m)   Wt 294 lb (133.4 kg)   LMP 12/23/2007 (Approximate)   SpO2 99%   BMI 52.08 kg/m   Physical Exam  Constitutional: She is oriented to person, place, and time. She appears well-developed and well-nourished. No distress.  HENT:  Head: Normocephalic.  Mouth/Throat: Oropharynx is clear and moist.  Neck: Normal range of motion. No JVD present. No tracheal deviation present.  Cardiovascular: Normal rate, regular rhythm and normal heart sounds.   Pulmonary/Chest: Effort normal and breath sounds normal. No respiratory distress. She has no rales.  Abdominal: Soft. She exhibits no mass. There is no tenderness. There is no guarding.  Neurological: She is alert and oriented to person, place, and time.  Skin: Skin is warm and dry.  Psychiatric: She has a normal mood and affect. Judgment normal.    Mallampati Score:  MD Evaluation Airway: WNL Heart: WNL Abdomen: WNL Chest/ Lungs: WNL ASA  Classification: 3 Mallampati/Airway Score: One  Imaging: US Renal  Result Date: 09/19/2016 CLINICAL DATA:  Acute on chronic renal failure. EXAM: RENAL / URINARY TRACT ULTRASOUND COMPLETE COMPARISON:  No recent prior. FINDINGS: Right Kidney: Length: 9.6 cm. Increased echogenicity consistent chronic medical renal disease. No mass or hydronephrosis visualized. Left Kidney: Length: 10.1 cm. Increased echogenicity consistent with chronic medical renal disease. No mass or hydronephrosis visualized. Bladder: Appears normal for degree of bladder distention. IMPRESSION: 1. Kidneys are echogenic consistent chronic medical renal disease. 2. No acute abnormality identified.  No hydronephrosis. Electronically Signed   By: Marcello Moores  Register   On: 09/19/2016 17:21     Labs:  CBC:  Recent Labs  11/19/15 1146 10/13/16 0702  WBC 4.6 4.9  HGB 12.6 11.0*  HCT 39.1 34.6*  PLT 223 224    COAGS:  Recent Labs  11/19/15 1146 10/13/16 0702  INR 1.05 0.98  APTT 29  --     BMP:  Recent Labs  11/19/15 1146 10/13/16 0702  NA 143 142  K 4.3 3.5  CL 112* 108  CO2 26 25  GLUCOSE 117* 137*  BUN 26* 33*  CALCIUM 8.8* 8.1*  CREATININE 1.50* 2.61*  GFRNONAA 36* 18*  GFRAA 42* 21*    LIVER FUNCTION TESTS:  Recent Labs  11/19/15 1146  BILITOT 0.5  AST 21  ALT 18  ALKPHOS 116  PROT 6.3*  ALBUMIN 3.6    TUMOR  MARKERS: No results for input(s): AFPTM, CEA, CA199, CHROMGRNA in the last 8760 hours.  Assessment and Plan: Renal insufficiency For US guided random renal biopsy Labs ok, BP ok Risks and Benefits discussed with the patient including, but not limited to bleeding, infection, damage to adjacent structures or low yield requiring additional tests. All of the patient's questions were answered, patient is agreeable to proceed. Consent signed and in chart.    Thank you for this interesting consult.  I greatly enjoyed meeting Dwain Sarna and look forward to participating in their care.  A copy of this report was sent to the requesting provider on this date.  Electronically Signed: Ascencion Dike 10/13/2016, 8:29 AM   I spent a total of 20 minutes in face to face in clinical consultation, greater than 50% of which was counseling/coordinating care for renal biopsy

## 2016-10-13 NOTE — Progress Notes (Signed)
Pt is prone on CT table, no complaints at this time. Pt is awake and alert. NSR, vitals stable

## 2016-10-13 NOTE — Progress Notes (Signed)
co2 26 Moving to CT to complete bx

## 2016-10-13 NOTE — Progress Notes (Signed)
Pt. Positioned in CT. AxOx3

## 2017-02-19 ENCOUNTER — Telehealth: Payer: Self-pay | Admitting: Nurse Practitioner

## 2017-02-19 ENCOUNTER — Other Ambulatory Visit: Payer: Self-pay | Admitting: Internal Medicine

## 2017-02-19 DIAGNOSIS — Z1231 Encounter for screening mammogram for malignant neoplasm of breast: Secondary | ICD-10-CM

## 2017-02-19 DIAGNOSIS — Z780 Asymptomatic menopausal state: Secondary | ICD-10-CM

## 2017-02-19 NOTE — Telephone Encounter (Signed)
Spoke with patient, advised order placed for BMD at [facility]. Advised patient to call [facility] directly for scheduling. Patient states she has already called, waiting on a order. Patient verbalizes understanding and is agreeable.  Routing to provider for final review. Patient is agreeable to disposition. Will close encounter.

## 2017-02-19 NOTE — Telephone Encounter (Signed)
Patient called and requested an order be sent to [place] at [facility] for a bone density test.

## 2017-03-02 ENCOUNTER — Ambulatory Visit: Payer: Federal, State, Local not specified - PPO | Admitting: Nurse Practitioner

## 2017-03-13 ENCOUNTER — Ambulatory Visit
Admission: RE | Admit: 2017-03-13 | Discharge: 2017-03-13 | Disposition: A | Discharge status: Home / Self Care | Payer: Federal, State, Local not specified - PPO | Source: Ambulatory Visit | Attending: Internal Medicine | Admitting: Internal Medicine

## 2017-03-13 DIAGNOSIS — Z1231 Encounter for screening mammogram for malignant neoplasm of breast: Secondary | ICD-10-CM

## 2017-03-17 ENCOUNTER — Ambulatory Visit: Payer: Federal, State, Local not specified - PPO | Admitting: Nurse Practitioner

## 2017-03-17 ENCOUNTER — Encounter: Payer: Self-pay | Admitting: Nurse Practitioner

## 2017-03-17 VITALS — BP 124/74 | HR 76 | Ht 63.0 in | Wt 297.0 lb

## 2017-03-17 DIAGNOSIS — J069 Acute upper respiratory infection, unspecified: Secondary | ICD-10-CM | POA: Diagnosis not present

## 2017-03-17 DIAGNOSIS — Z01411 Encounter for gynecological examination (general) (routine) with abnormal findings: Secondary | ICD-10-CM | POA: Diagnosis not present

## 2017-03-17 DIAGNOSIS — Z800 Family history of malignant neoplasm of digestive organs: Secondary | ICD-10-CM | POA: Diagnosis not present

## 2017-03-17 DIAGNOSIS — Z0000 Encounter for general adult medical examination without abnormal findings: Secondary | ICD-10-CM

## 2017-03-17 MED ORDER — AZITHROMYCIN 250 MG PO TABS: 250.0000 mg | ORAL_TABLET | Freq: Every day | ORAL | 0 refills | Status: DC

## 2017-03-17 NOTE — Patient Instructions (Signed)

## 2017-03-17 NOTE — Progress Notes (Signed)
Patient ID: Colleen Gray, female   DOB: 01-22-1951, 66 y.o.   MRN: [id number]  66 y.o. G1P0010 Widowed  African American Fe here for annual exam.  Stage IV kidney disease.  She has had steroids, aleve along with shoulder replacement and knee pain.   Lives alone in [place] and nearest relative is aunt who lives in [place].  Not dating or SA.  Last week when she went for Mammogram came down with URI symptoms of nasal congetion and cough.  Patient's last menstrual period was 12/23/2007 (approximate).          Sexually active: No.  The current method of family planning is abstinence and post menopausal status.    Exercising: No.  The patient does not participate in regular exercise at present. Smoker:  no  Health Maintenance: Pap: 11/30/14, Negative with neg HR HPV  11/21/13, Negative with neg HR HPV MMG: 03/13/17, Bi-Rads 1:  Negative Colonoscopy: 05/25/15, Normal, repeat in 5 years BMD: 2016 per patient normal TDaP:  02/26/16 Shingles: 02/26/16 Pneumonia: 11/21/14 Hep C and HIV: done PCP Labs: Nephrology and PCP take care of all labs   reports that she has never smoked. She has never used smokeless tobacco. She reports that she does not drink alcohol or use drugs.  Past Medical History:  Diagnosis Date  . Anemia   . Arthritis   . Asthma   . Bradycardia   . Chronic renal failure, stage 4 (severe) ([place]) 2017   no dialysis at this time  . Enlarged heart    Sees Dr. Einar Gip  . Heart murmur    due to rheumatic fever  . Hemorrhoid   . History of rheumatic fever    with a heart murmer  . Hypertension   . Ovarian cyst 1985   removed  . Pneumonia   . Sleep apnea    does not use Cpap (after weight loss no longer needed)    Past Surgical History:  Procedure Laterality Date  . BUNIONECTOMY WITH HAMMERTOE RECONSTRUCTION Bilateral 1980's; 1999; 2009    left; right; left 'w/plates and screws"  . COLONOSCOPY  2016   [facility] of colon cancer  . DILATION AND CURETTAGE OF UTERUS  early 1980's    for missed AB  . JOINT REPLACEMENT    . LAPAROSCOPIC GASTRIC BYPASS  2007  . LAPAROSCOPIC OVARIAN CYSTECTOMY Right 1985  . REVERSE SHOULDER ARTHROPLASTY Left 11/22/2015   Procedure: LEFT REVERSE SHOULDER ARTHROPLASTY;  Surgeon: Justice Britain, MD;  Location: [facility];  Service: Orthopedics;  Laterality: Left;  . ROTATOR CUFF REPAIR Right 2006    Current Outpatient Prescriptions  Medication Sig Dispense Refill  . acetaminophen (TYLENOL) 500 MG tablet Take 500 mg by mouth 3 (three) times daily as needed.    Marland Kitchen albuterol (PROAIR HFA) 108 (90 BASE) MCG/ACT inhaler Inhale 2 puffs into the lungs every 4 (four) hours as needed for shortness of breath.     . Alpha-Lipoic Acid 200 MG TABS Take 1 tablet by mouth daily.    Marland Kitchen aspirin 81 MG tablet Take 81 mg by mouth 3 (three) times a week. No specific days    . Calcium-Magnesium-Vitamin D (CALCIUM 1200+D3 PO) Take 1 tablet by mouth daily.    . Cobalamine Combinations (B-12) 8635716898 MCG SUBL Place 1 tablet under the tongue 3 (three) times a week.    Marland Kitchen lisinopril (PRINIVIL,ZESTRIL) 20 MG tablet Take 20 mg by mouth daily.  0  . Misc Natural Products (SUPER GREENS PO) Take 2 capsules by  mouth daily.    . Multiple Vitamin (MULTIVITAMIN WITH MINERALS) TABS tablet Take 1 tablet by mouth daily.    Ernestine Conrad 3-6-9 Fatty Acids (OMEGA-3-6-9 PO) Take 1 capsule by mouth daily.    Marland Kitchen torsemide (DEMADEX) 20 MG tablet Take 10 mg by mouth daily.  0  . TURMERIC CURCUMIN PO Take 1 capsule by mouth daily.    . vitamin B-12 (CYANOCOBALAMIN) 100 MCG tablet Take 50 mcg by mouth daily.    . vitamin C (ASCORBIC ACID) 500 MG tablet Take 500 mg by mouth daily.    . Vitamin D, Ergocalciferol, (DRISDOL) 50000 units CAPS capsule Take 1 capsule by mouth once a week.  3  . azithromycin (ZITHROMAX) 250 MG tablet Take 1 tablet (250 mg total) by mouth daily. 6 tablet 0   No current facility-administered medications for this visit.     Family History  Problem Relation Age of Onset  .  Colon cancer Mother 5  . Diabetes Mother   . Hypertension Mother   . Diabetes Father   . Hypertension Father   . Colon cancer Maternal Grandmother 96  . Diabetes Paternal Grandmother   . Diabetes Paternal Grandfather     ROS:  Pertinent items are noted in HPI.  Otherwise, a comprehensive ROS was negative.  Exam:   BP 124/74 (BP Location: Right Arm, Patient Position: Sitting, Cuff Size: Large)   Pulse 76   Ht 5\' 3"  (1.6 m)   Wt 297 lb (134.7 kg)   LMP 12/23/2007 (Approximate)   BMI 52.61 kg/m  Height: 5\' 3"  (160 cm) Ht Readings from Last 3 Encounters:  03/17/17 5\' 3"  (1.6 m)  10/13/16 5\' 3"  (1.6 m)  02/27/16 5' 2.5" (1.588 m)    General appearance: alert, cooperative and appears stated age Head: Normocephalic, without obvious abnormality, atraumatic Neck: no adenopathy, supple, symmetrical, trachea midline and thyroid normal to inspection and palpation Lungs: congested cough with diffuse wheezing bilaterally that partially clears with cough. Breasts: normal appearance, no masses or tenderness Heart: regular rate and rhythm Abdomen: soft, non-tender; no masses,  no organomegaly Extremities: extremities normal, atraumatic, no cyanosis or edema, limited ROM of most joints especially left shoulder Skin: Skin color, texture, turgor normal. No rashes or lesions Lymph nodes: Cervical, supraclavicular, and axillary nodes normal. No abnormal inguinal nodes palpated Neurologic: Grossly normal   Pelvic: External genitalia:  no lesions              Urethra:  normal appearing urethra with no masses, tenderness or lesions              Bartholin's and Skene's: normal                 Vagina: normal appearing vagina with normal color and discharge, no lesions              Cervix: anteverted              Pap taken: Yes.  but blind pap due to pt inability to slide down on table. Bimanual Exam:  Uterus:  normal size, contour, position, consistency, mobility, non-tender - very limited due to  body habitus              Adnexa: no mass, fullness, tenderness also limited exam               Rectovaginal: Confirms               Anus:  normal sphincter tone, no lesions  Chaperone present: yes  A:  Well Woman with normal exam  Postmenopausal with PMB 11/08/14 along with abdominal bloating with negative PUS and normal CA 125 History of Rheumatic heart disease S/P right [facility] of colon cancer with Mother and MGM S/P Gastric bypass 2007   New diagnosis of stage IV renal disease  S/P left shoulder replacement  URI symptoms  History of Vit D deficiency   P:   Reviewed health and wellness pertinent to exam  Pap smear was  done  Mammogram is due 02/2018  Pt is given a RX for Z pack # 6 tabs as directed - she is aware if not better or symptoms of URI worsens to seek help with PCP  Counseled on breast self exam, mammography screening, adequate intake of calcium and vitamin D, diet and exercise return annually or prn  An After Visit Summary was printed and given to the patient.

## 2017-03-20 LAB — IPS PAP TEST WITH HPV

## 2017-03-21 NOTE — Progress Notes (Signed)
Reviewed personally.  M. Suzanne Telma Pyeatt, MD.  

## 2017-04-17 ENCOUNTER — Other Ambulatory Visit: Payer: Self-pay | Admitting: Internal Medicine

## 2017-04-17 DIAGNOSIS — R1011 Right upper quadrant pain: Secondary | ICD-10-CM

## 2017-04-22 ENCOUNTER — Ambulatory Visit
Admission: RE | Admit: 2017-04-22 | Discharge: 2017-04-22 | Disposition: A | Discharge status: Home / Self Care | Payer: Federal, State, Local not specified - PPO | Source: Ambulatory Visit | Attending: Internal Medicine | Admitting: Internal Medicine

## 2017-04-22 DIAGNOSIS — R1011 Right upper quadrant pain: Secondary | ICD-10-CM

## 2017-04-27 ENCOUNTER — Other Ambulatory Visit: Payer: Self-pay | Admitting: Internal Medicine

## 2017-04-27 DIAGNOSIS — K769 Liver disease, unspecified: Secondary | ICD-10-CM

## 2017-05-12 ENCOUNTER — Ambulatory Visit
Admission: RE | Admit: 2017-05-12 | Discharge: 2017-05-12 | Disposition: A | Discharge status: Home / Self Care | Payer: Federal, State, Local not specified - PPO | Source: Ambulatory Visit | Attending: Internal Medicine | Admitting: Internal Medicine

## 2017-05-12 ENCOUNTER — Other Ambulatory Visit: Payer: Self-pay | Admitting: Internal Medicine

## 2017-05-12 DIAGNOSIS — K769 Liver disease, unspecified: Secondary | ICD-10-CM

## 2017-07-17 ENCOUNTER — Telehealth: Payer: Self-pay | Admitting: Obstetrics and Gynecology

## 2017-07-17 NOTE — Telephone Encounter (Signed)
Left message for patient to reschedule [facility] appt.

## 2017-09-16 ENCOUNTER — Emergency Department (HOSPITAL_COMMUNITY)
Admission: EM | Admit: 2017-09-16 | Discharge: 2017-09-17 | Disposition: A | Discharge status: Home / Self Care | Payer: Federal, State, Local not specified - PPO | Attending: Emergency Medicine | Admitting: Emergency Medicine

## 2017-09-16 ENCOUNTER — Emergency Department (HOSPITAL_COMMUNITY): Payer: Federal, State, Local not specified - PPO

## 2017-09-16 ENCOUNTER — Other Ambulatory Visit: Payer: Self-pay

## 2017-09-16 ENCOUNTER — Encounter (HOSPITAL_COMMUNITY): Payer: Self-pay | Admitting: Emergency Medicine

## 2017-09-16 DIAGNOSIS — I129 Hypertensive chronic kidney disease with stage 1 through stage 4 chronic kidney disease, or unspecified chronic kidney disease: Secondary | ICD-10-CM | POA: Diagnosis not present

## 2017-09-16 DIAGNOSIS — R0789 Other chest pain: Secondary | ICD-10-CM

## 2017-09-16 DIAGNOSIS — Z96619 Presence of unspecified artificial shoulder joint: Secondary | ICD-10-CM | POA: Diagnosis not present

## 2017-09-16 DIAGNOSIS — Z7982 Long term (current) use of aspirin: Secondary | ICD-10-CM | POA: Diagnosis not present

## 2017-09-16 DIAGNOSIS — J45909 Unspecified asthma, uncomplicated: Secondary | ICD-10-CM | POA: Diagnosis not present

## 2017-09-16 DIAGNOSIS — N184 Chronic kidney disease, stage 4 (severe): Secondary | ICD-10-CM | POA: Diagnosis not present

## 2017-09-16 DIAGNOSIS — Z8679 Personal history of other diseases of the circulatory system: Secondary | ICD-10-CM | POA: Insufficient documentation

## 2017-09-16 DIAGNOSIS — Z79899 Other long term (current) drug therapy: Secondary | ICD-10-CM | POA: Diagnosis not present

## 2017-09-16 LAB — CBC
HCT: 35.3 % — ABNORMAL LOW (ref 36.0–46.0)
Hemoglobin: 11.3 g/dL — ABNORMAL LOW (ref 12.0–15.0)
MCH: 26.8 pg (ref 26.0–34.0)
MCHC: 32 g/dL (ref 30.0–36.0)
MCV: 83.8 fL (ref 78.0–100.0)
Platelets: 294 10*3/uL (ref 150–400)
RBC: 4.21 MIL/uL (ref 3.87–5.11)
RDW: 15.1 % (ref 11.5–15.5)
WBC: 7.9 10*3/uL (ref 4.0–10.5)

## 2017-09-16 LAB — BASIC METABOLIC PANEL
Anion gap: 7 (ref 5–15)
BUN: 49 mg/dL — ABNORMAL HIGH (ref 6–20)
CO2: 25 mmol/L (ref 22–32)
Calcium: 8.8 mg/dL — ABNORMAL LOW (ref 8.9–10.3)
Chloride: 108 mmol/L (ref 101–111)
Creatinine, Ser: 2.55 mg/dL — ABNORMAL HIGH (ref 0.44–1.00)
GFR calc Af Amer: 22 mL/min — ABNORMAL LOW (ref >60)
GFR calc non Af Amer: 19 mL/min — ABNORMAL LOW (ref >60)
Glucose, Bld: 110 mg/dL — ABNORMAL HIGH (ref 65–99)
Potassium: 3.4 mmol/L — ABNORMAL LOW (ref 3.5–5.1)
Sodium: 140 mmol/L (ref 135–145)

## 2017-09-16 LAB — BRAIN NATRIURETIC PEPTIDE: B Natriuretic Peptide: 21.3 pg/mL (ref 0.0–100.0)

## 2017-09-16 LAB — I-STAT TROPONIN, ED: Troponin i, poc: 0.01 ng/mL (ref 0.00–0.08)

## 2017-09-16 MED ORDER — MORPHINE SULFATE (PF) 4 MG/ML IV SOLN
4.0000 mg | Freq: Once | INTRAVENOUS | Status: AC
  Administered 2017-09-16: 4 mg via INTRAVENOUS
  Filled 2017-09-16: qty 1

## 2017-09-16 NOTE — ED Triage Notes (Signed)
Chest pain started today while laying down -- pain is 6/10 at present--

## 2017-09-16 NOTE — ED Provider Notes (Signed)
[place] DEPT Provider Note   CSN: [id number] Arrival date & time: 09/16/17  1655     History   Chief Complaint Chief Complaint  Patient presents with  . Chest Pain    HPI Colleen Gray is a 66 y.o. female.  Patient with HTN, obesity, presents to the ED with a chief complaint of chest pain.  She states that the pain started last night.  She reports that it has gradually worsened today.  She rates the pain as moderate and describes it as squeezing. She states that the pain is worsened with movement, palpation, and deep breathing.  She denies any fevers, chills, or cough. She denies any injuries. She tried taking aspirin this morning with no relief.  She denies any SOB, radiating pain, or diaphoresis.  She denies any history of ACS, PE, or DVT.  Cardiac risk factors are age, HTN, and obesity.     The history is provided by the patient. No language interpreter was used.    Past Medical History:  Diagnosis Date  . Anemia   . Arthritis   . Asthma   . Bradycardia   . Chronic renal failure, stage 4 (severe) ([place]) 2017   no dialysis at this time  . Enlarged heart    Sees Dr. Einar Gip  . Heart murmur    due to rheumatic fever  . Hemorrhoid   . History of rheumatic fever    with a heart murmer  . Hypertension   . Ovarian cyst 1985   removed  . Pneumonia   . Sleep apnea    does not use Cpap (after weight loss no longer needed)    Patient Active Problem List   Diagnosis Date Noted  . Carpal tunnel syndrome 02/06/2016  . 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 Atrophic vaginitis 11/10/2012    Past Surgical History:  Procedure Laterality Date  . BUNIONECTOMY WITH HAMMERTOE RECONSTRUCTION Bilateral 1980's; 1999; 2009    left; right; left 'w/plates and screws"  . COLONOSCOPY  2016   [facility] of colon cancer  . DILATION AND CURETTAGE OF UTERUS  early 1980's   for missed AB  . JOINT REPLACEMENT    . LAPAROSCOPIC GASTRIC BYPASS  2007  . LAPAROSCOPIC OVARIAN CYSTECTOMY Right  1985  . REVERSE SHOULDER ARTHROPLASTY Left 11/22/2015   Procedure: LEFT REVERSE SHOULDER ARTHROPLASTY;  Surgeon: Justice Britain, MD;  Location: [facility];  Service: Orthopedics;  Laterality: Left;  . ROTATOR CUFF REPAIR Right 2006    OB History    Gravida Para Term Preterm AB Living   1 0 0 0 1 0   SAB TAB Ectopic Multiple Live Births   1 0 0 0 0       Home Medications    Prior to Admission medications   Medication Sig Start Date End Date Taking? Authorizing Provider  acetaminophen (TYLENOL) 500 MG tablet Take 500 mg by mouth 3 (three) times daily as needed.    [provider]  albuterol (PROAIR HFA) 108 (90 BASE) MCG/ACT inhaler Inhale 2 puffs into the lungs every 4 (four) hours as needed for shortness of breath.  10/22/13   [provider]  Alpha-Lipoic Acid 200 MG TABS Take 1 tablet by mouth daily.    [provider]  aspirin 81 MG tablet Take 81 mg by mouth 3 (three) times a week. No specific days    [provider]  azithromycin (ZITHROMAX) 250 MG tablet Take 1 tablet (250 mg total) by mouth daily. 03/17/17  Kem Boroughs, FNP  Calcium-Magnesium-Vitamin D (CALCIUM 1200+D3 PO) Take 1 tablet by mouth daily.    [provider]  Cobalamine Combinations (B-12) 437 841 0470 MCG SUBL Place 1 tablet under the tongue 3 (three) times a week.    [provider]  lisinopril (PRINIVIL,ZESTRIL) 20 MG tablet Take 20 mg by mouth daily. 01/20/17   [provider]  Misc Natural Products (SUPER GREENS PO) Take 2 capsules by mouth daily.    [provider]  Multiple Vitamin (MULTIVITAMIN WITH MINERALS) TABS tablet Take 1 tablet by mouth daily.    [provider]  Omega 3-6-9 Fatty Acids (OMEGA-3-6-9 PO) Take 1 capsule by mouth daily.    [provider]  torsemide (DEMADEX) 20 MG tablet Take 10 mg by mouth daily. 01/20/17   [provider]  TURMERIC CURCUMIN PO Take 1 capsule by mouth daily.    [provider]  vitamin B-12 (CYANOCOBALAMIN) 100 MCG tablet Take 50 mcg by mouth daily.    [provider]  vitamin C (ASCORBIC ACID) 500 MG tablet Take 500 mg by mouth daily.    [provider]  Vitamin D, Ergocalciferol, (DRISDOL) 50000 units CAPS capsule Take 1 capsule by mouth once a week. 01/15/17   [provider]    Family History Family History  Problem Relation Age of Onset  . Colon cancer Mother 70  . Diabetes Mother   . Hypertension Mother   . Diabetes Father   . Hypertension Father   . Colon cancer Maternal Grandmother 96  . Diabetes Paternal Grandmother   . Diabetes Paternal Grandfather     Social History Social History  Substance Use Topics  . Smoking status: Never Smoker  . Smokeless tobacco: Never Used  . Alcohol use No     Allergies   Nifedipine and Penicillins   Review of Systems Review of Systems  All other systems reviewed and are negative.    Physical Exam Updated Vital Signs BP (!) 104/51   Pulse 78   Temp 98.5 F (36.9 C) (Oral)   Resp 20   Ht 5\' 3"  (1.6 m)   Wt 136.1 kg (300 lb)   LMP 12/23/2007 (Approximate)   SpO2 93%   BMI 53.14 kg/m   Physical Exam  Constitutional: She is oriented to person, place, and time. She appears well-developed and well-nourished.  HENT:  Head: Normocephalic and atraumatic.  Eyes: Pupils are equal, round, and reactive to light. Conjunctivae and EOM are normal.  Neck: Normal range of motion. Neck supple.  Cardiovascular: Normal rate and regular rhythm.  Exam reveals no gallop and no friction rub.   No murmur heard. Pulmonary/Chest: Effort normal and breath sounds normal. No respiratory distress. She has no wheezes. She has no rales. She exhibits tenderness.  Anterior chest wall tender to palpation CTAB  Abdominal: Soft. Bowel sounds are normal. She exhibits no distension and no mass. There is no tenderness. There is no rebound and no guarding.  Musculoskeletal: Normal range of motion. She  exhibits no edema or tenderness.  Neurological: She is alert and oriented to person, place, and time.  Skin: Skin is warm and dry.  No rash  Psychiatric: She has a normal mood and affect. Her behavior is normal. Judgment and thought content normal.  Nursing note and vitals reviewed.    ED Treatments / Results  Labs (all labs ordered are listed, but only abnormal results are displayed) Labs Reviewed  BASIC METABOLIC PANEL - Abnormal; Notable for the following:  Result Value   Potassium 3.4 (*)    Glucose, Bld 110 (*)    BUN 49 (*)    Creatinine, Ser 2.55 (*)    Calcium 8.8 (*)    GFR calc non Af Amer 19 (*)    GFR calc Af Amer 22 (*)    All other components within normal limits  CBC - Abnormal; Notable for the following:    Hemoglobin 11.3 (*)    HCT 35.3 (*)    All other components within normal limits  BRAIN NATRIURETIC PEPTIDE  I-STAT TROPONIN, ED  I-STAT TROPONIN, ED    EKG  EKG Interpretation  Date/Time:  Wednesday September 16 2017 17:34:09 EDT Ventricular Rate:  62 PR Interval:  174 QRS Duration: 122 QT Interval:  410 QTC Calculation: 416 R Axis:   135 Text Interpretation:  Normal sinus rhythm Possible Right ventricular hypertrophy Possible Lateral infarct , age undetermined ST & T wave abnormality, consider inferior ischemia ST & T wave abnormality, consider anterior ischemia Abnormal ECG not dynamically changed from 20 mintues ago.  Confirmed by Zenovia Jarred [phone]) on 09/16/2017 5:39:53 PM       Radiology Dg Chest 2 View  Result Date: 09/16/2017 CLINICAL DATA:  Chest tightness radiating to back beginning last night. History of hypertension and chronic kidney disease. EXAM: CHEST  2 VIEW COMPARISON:  Chest radiograph December 10, 2015 FINDINGS: Cardiac silhouette is mildly enlarged. Calcified aortic knob. Mild bronchitic changes without pleural effusion or focal consolidation. No pneumothorax. LEFT shoulder arthroplasty. Severe degenerative change  of the RIGHT shoulder. Large body habitus. Mild degenerative change of thoracic spine. IMPRESSION: Stable cardiomegaly and mild bronchitic changes. Aortic Atherosclerosis (ICD10-I70.0). Electronically Signed   By: Elon Alas M.D.   On: 09/16/2017 17:57    Procedures Procedures (including critical care time)  Medications Ordered in ED Medications  morphine 4 MG/ML injection 4 mg (not administered)     Initial Impression / Assessment and Plan / ED Course  I have reviewed the triage vital signs and the nursing notes.  Pertinent labs & imaging results that were available during my care of the patient were reviewed by me and considered in my medical decision making (see chart for details).     Patient with anterior chest pain.  Symptoms started last night.  Easily reproducible.  Troponin is negative, will check repeat.  EKG is abnormal, but no dynamic changes today.  Cardiac risk factors are age, HTN, and obesity.  HEART score is 3.  Thought to be low risk for PE is 0.  PE thought to be less likely.  No ripping or tearing chest pain, no numbness or weakness, doubt dissection.  CXR is clear.  Repeat troponin and EKG are reassuring.  Doubt ACS.  Recommend close follow-up with PCP and/or cardiology.  Will treat for chest wall pain.  Cr chronically elevated, so won't give NSAIDs.    Patient understands and agrees with the plan.  Patient discussed with Dr. Leonides Schanz, who agrees with the plan.  Final Clinical Impressions(s) / ED Diagnoses   Final diagnoses:  Chest wall pain    New Prescriptions New Prescriptions   HYDROCODONE-ACETAMINOPHEN (NORCO/VICODIN) 5-325 MG TABLET    Take 1-2 tablets by mouth every 6 (six) hours as needed.     Montine Circle, PA-C 09/17/17 0119    Ward, Delice Bison, DO 09/17/17 0130

## 2017-09-17 LAB — I-STAT TROPONIN, ED: Troponin i, poc: 0 ng/mL (ref 0.00–0.08)

## 2017-09-17 MED ORDER — HYDROCODONE-ACETAMINOPHEN 5-325 MG PO TABS: 1.0000 | ORAL_TABLET | Freq: Four times a day (QID) | ORAL | 0 refills | Status: DC | PRN

## 2017-10-03 IMAGING — CT CT BIOPSY
1 of 2 series · 13 of 32 positions shown, 18 images · non-contrast
Comparison: none

INDICATION: 64-year-old female with renal insufficiency. Scheduled for renal
biopsy.

[Series 2: i-spiral 5.0 b40f · axial · 0.95mm/px · z∈[+1083,+1230]mm · 13 of 48 slices shown, 18 images]
[im 3/48  soft-tissue]
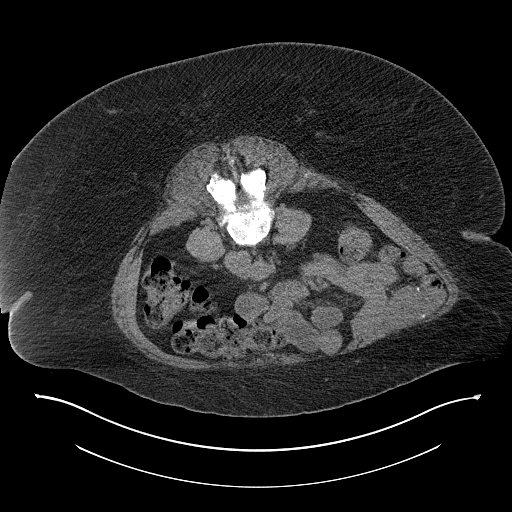
[im 3/48  bone]
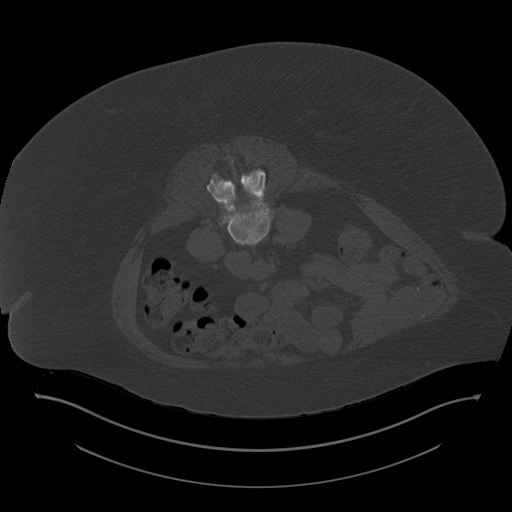
[im 9/48  soft-tissue]
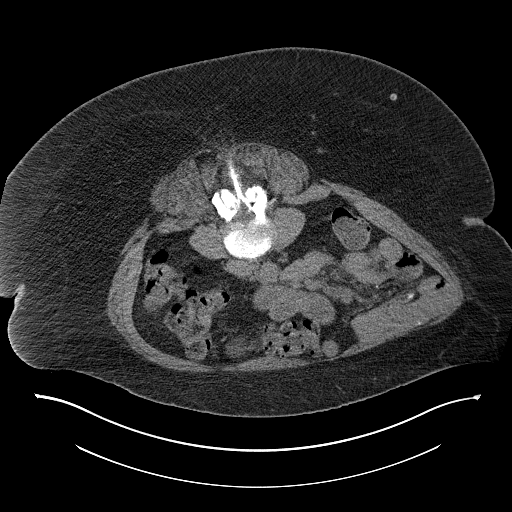
[im 12/48  soft-tissue]
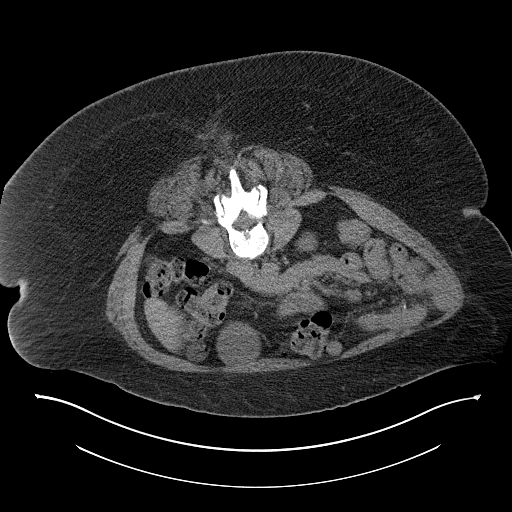
[im 14/48  soft-tissue]
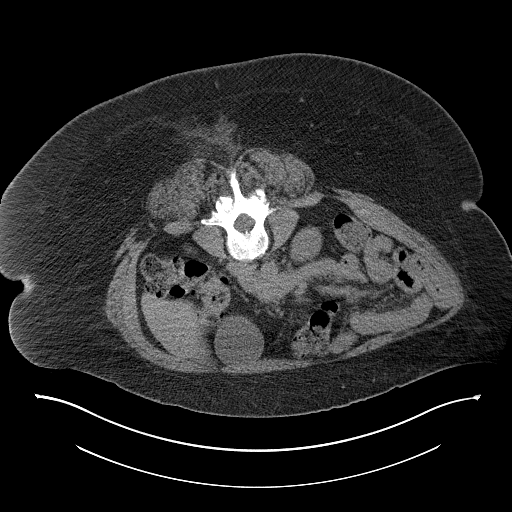
[im 20/48  soft-tissue]
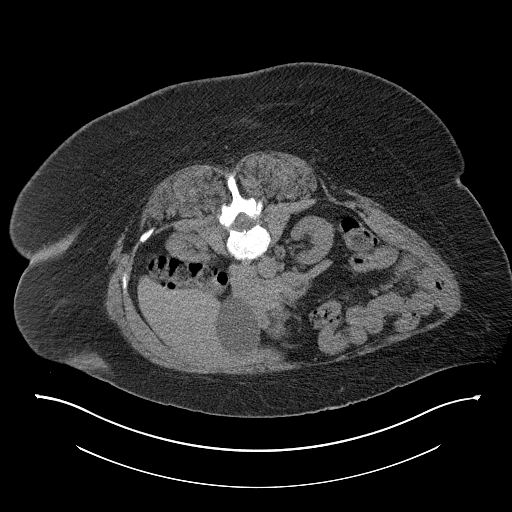
[im 23/48  soft-tissue]
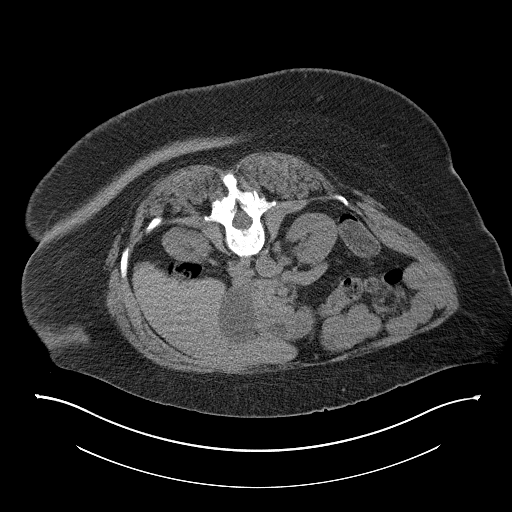
[im 25/48  soft-tissue]
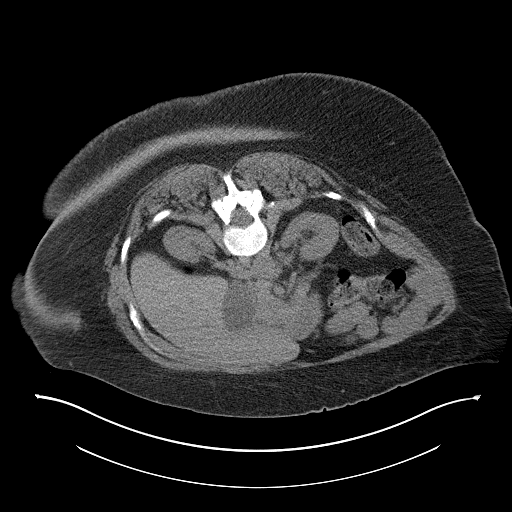
[im 31/48  soft-tissue]
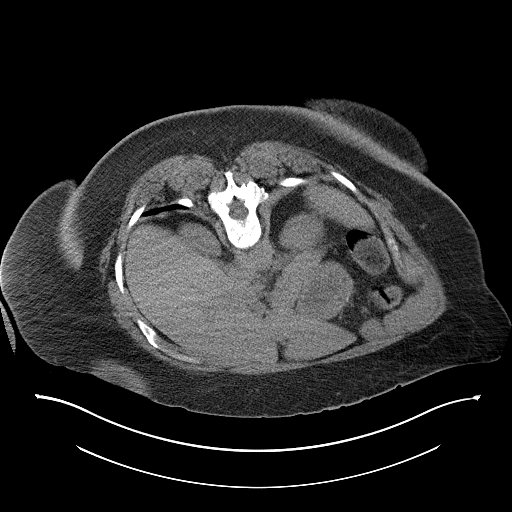
[im 34/48  soft-tissue]
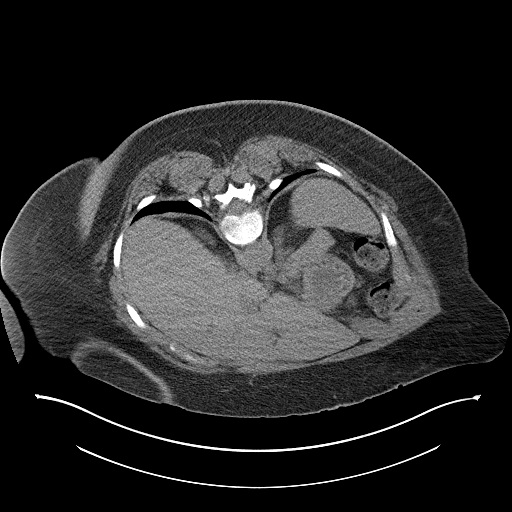
[im 34/48  bone]
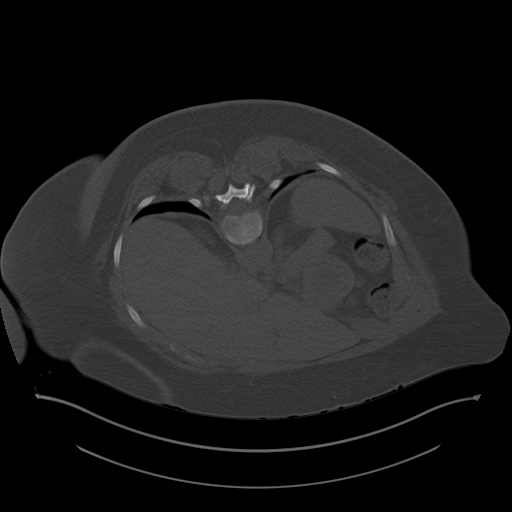
[im 36/48  soft-tissue]
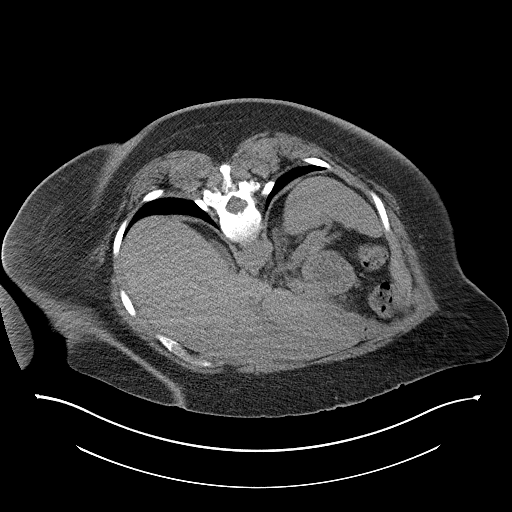
[im 36/48  lung]
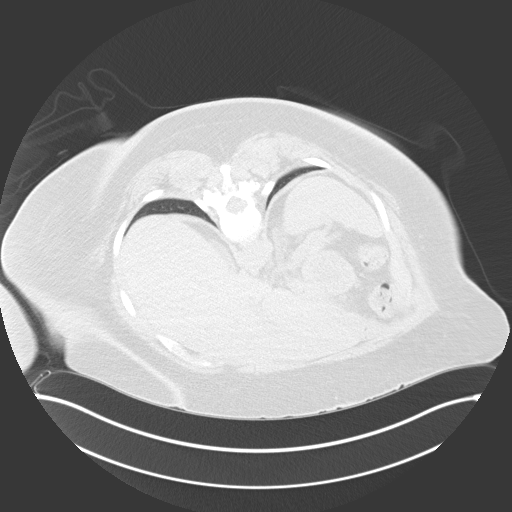
[im 39/48  lung]
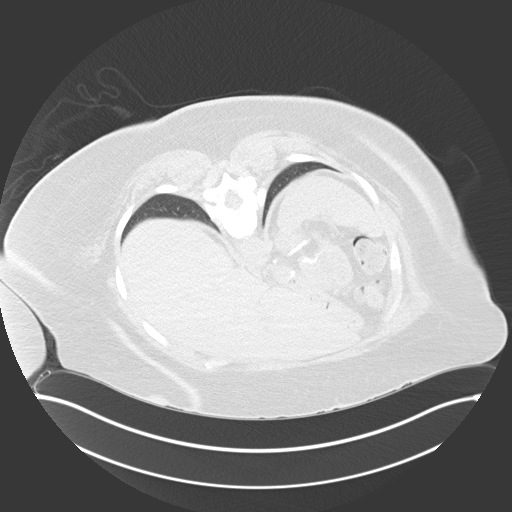
[im 42/48  soft-tissue]
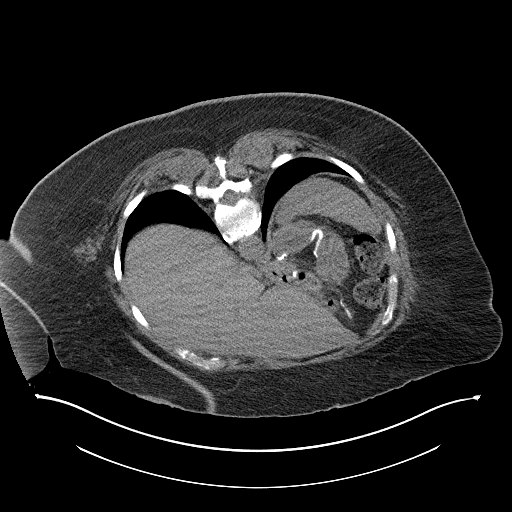
[im 42/48  lung]
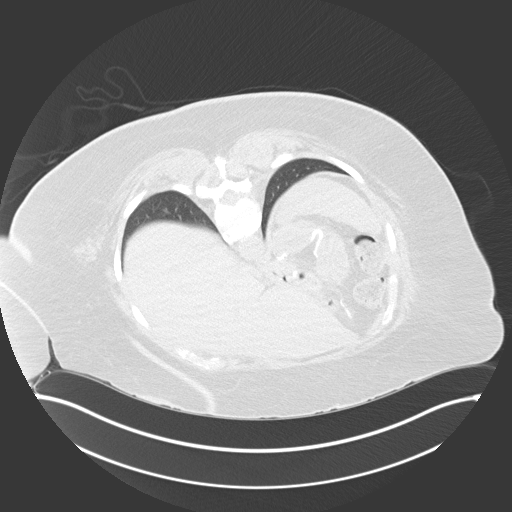
[im 45/48  soft-tissue]
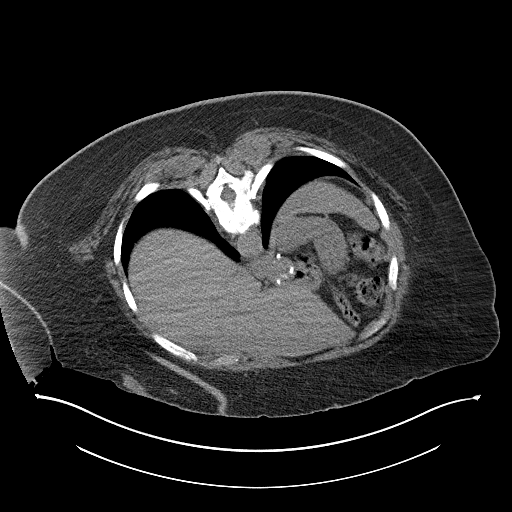
[im 45/48  lung]
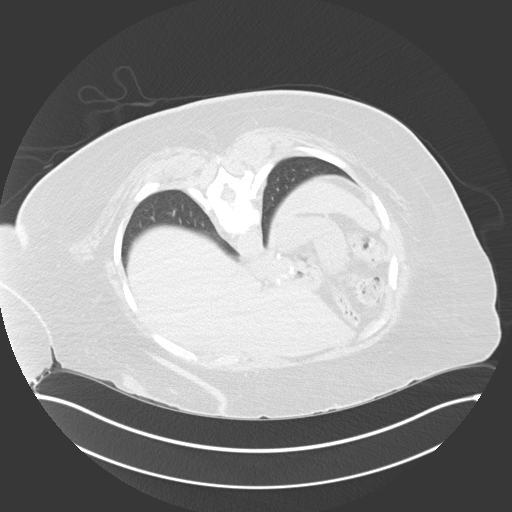

[13 of 32 positions shown; findings below may reference images not displayed]

EXAM:
ATTEMPTED CT-GUIDED RENAL BIOPSY, ABORTED PROCEDURE

MEDICATIONS:
None.

ANESTHESIA/SEDATION:
Moderate (conscious) sedation was employed during this procedure. A
total of Versed 2.0 mg and Fentanyl 100 mcg was administered
intravenously.

Moderate Sedation Time: 20 minutes minutes. The patient's level of
consciousness and vital signs were monitored continuously by
radiology nursing throughout the procedure under my direct
supervision.

FLUOROSCOPY TIME:  None

COMPLICATIONS:
None immediate.

PROCEDURE:
Informed written consent was obtained from the patient after a
thorough discussion of the procedural risks, benefits and
alternatives. All questions were addressed. A timeout was performed
prior to the initiation of the procedure.

The patient was initially evaluated in the ultrasound suite. Patient
was placed prone and both flanks were evaluated with ultrasound.
Left kidney was identified but difficult to see. The left flank was
prepped and draped in sterile fashion. After the patient had been
prepped and draped, the left kidney was unable to be identified due
to adjacent bowel gas. Therefore, the procedure was stopped in
ultrasound and transferred to the CT suite.

Patient was placed prone on the CT scanner and CT images through the
abdomen were obtained. Left kidney was felt to be most suitable for
biopsy because the right kidney appeared to be more atrophic. Left
flank was prepped and draped in a sterile fashion. Skin was
anesthetized with 1% lidocaine. The coaxial needle was directed
towards the left kidney with CT guidance. The coaxial needle was too
short to reach the left kidney. Therefore, the 16 gauge, 16 cm
biopsy needle was directed toward the left kidney with CT guidance.
Again, this needle was too short to reached the left kidney.
Considered trying an 18 gauge, 20 gauge core needle but concerned
that an 18 gauge device may not yield adequate samples. At this
time, the patient had been lying on the table for quite a while and
she was noticeably uncomfortable. As a result, the procedure was
aborted. Bandage placed over the puncture site.
FINDINGS: Changes compatible with a gastric bypass procedure. Patient has a
large amount of subcutaneous fat in the flank areas. 16 cm ,16 gauge
core needle would not reach the left kidney. Did not attempt to
biopsy of the right kidney because this kidney was more atrophic and
close proximity to the right colon.
IMPRESSION: Aborted CT-guided renal biopsy. The procedure was stopped because
from the available 16 gauge renal core biopsy needle was not long
enough. Discussed with the referring physician Dr. Leonel. If the
patient and Dr. Leonel would like us to try again, we will need to
order a longer core biopsy needle in advance.

## 2018-03-16 ENCOUNTER — Other Ambulatory Visit: Payer: Self-pay | Admitting: Internal Medicine

## 2018-03-16 DIAGNOSIS — Z139 Encounter for screening, unspecified: Secondary | ICD-10-CM

## 2018-03-18 ENCOUNTER — Ambulatory Visit
Admission: RE | Admit: 2018-03-18 | Discharge: 2018-03-18 | Disposition: A | Discharge status: Home / Self Care | Payer: Medicare Other | Source: Ambulatory Visit | Attending: Internal Medicine | Admitting: Internal Medicine

## 2018-03-18 DIAGNOSIS — Z139 Encounter for screening, unspecified: Secondary | ICD-10-CM

## 2018-03-22 ENCOUNTER — Ambulatory Visit (INDEPENDENT_AMBULATORY_CARE_PROVIDER_SITE_OTHER): Payer: Medicare Other | Admitting: Podiatry

## 2018-03-22 ENCOUNTER — Encounter: Payer: Self-pay | Admitting: Podiatry

## 2018-03-22 ENCOUNTER — Ambulatory Visit: Payer: Federal, State, Local not specified - PPO | Admitting: Nurse Practitioner

## 2018-03-22 DIAGNOSIS — E119 Type 2 diabetes mellitus without complications: Secondary | ICD-10-CM

## 2018-03-22 DIAGNOSIS — M205X2 Other deformities of toe(s) (acquired), left foot: Secondary | ICD-10-CM | POA: Diagnosis not present

## 2018-03-22 NOTE — Progress Notes (Signed)
Subjective: Colleen Gray presents the office today for foot exam.  She states that she has stage IV kidney failure and she is also "borderline" diabetic. She denies any open sores or any swelling or drainage to her feet.  She states that the left third toe starting to overlap the fourth toe some and she is asking there was a toe separator that we can put in between there to help with this.  Otherwise she states that she gets pedicures to her feet and she has not had any issues that she can have a good foot check today.  No recent injury. Denies any systemic complaints such as fevers, chills, nausea, vomiting. No acute changes since last appointment, and no other complaints at this time.   Objective: AAO x3, NAD DP/PT pulses palpable 2/4 bilaterally, CRT less than 3 seconds Sensation appears to be intact with Derrel Nip monofilament, vibratory sensation intact as well. There is no open sores identified bilaterally there is no pre-ulcerative calluses.  The scar is in the prior surgery are all well-healed.  No range of motion of the first MPJ.  There is lateral deviation of the lesser digits of the left foot and the third toe is starting to overlap the fourth digit.  There is no pain associated with this.  She started to get a mild ingrown toenail to the right fourth toe but there is no edema, erythema, drainage or pus or any clinical signs of infection noted today. No areas of tenderness bilaterally No pain with calf compression, swelling, warmth, erythema  Assessment: Patient presents for foot exam  Plan: -All treatment options discussed with the patient including all alternatives, risks, complications.  -Overall she is doing well to her feet.  I did dispense a toe separator to put between the third and fourth toes in the left foot.  Discussed the change in shoes.  Discussed with her to make sure the nails are being cut straight across.  Overall the nails are cut at proper length today there is no  signs of infection or fungus.  Discussed daily foot inspection.  I will see her back in 1 year or sooner if needed.  Encouraged to call any questions or concerns and she agrees with this plan.  Trula Slade DPM

## 2018-09-06 IMAGING — CR DG CHEST 2V
2 series · 2 of 2 positions shown · non-contrast
Comparison: Chest radiograph December 10, 2015

CLINICAL DATA: Chest tightness radiating to back beginning last
night. History of hypertension and chronic kidney disease.

EXAM:
CHEST  2 VIEW

[chest lat]
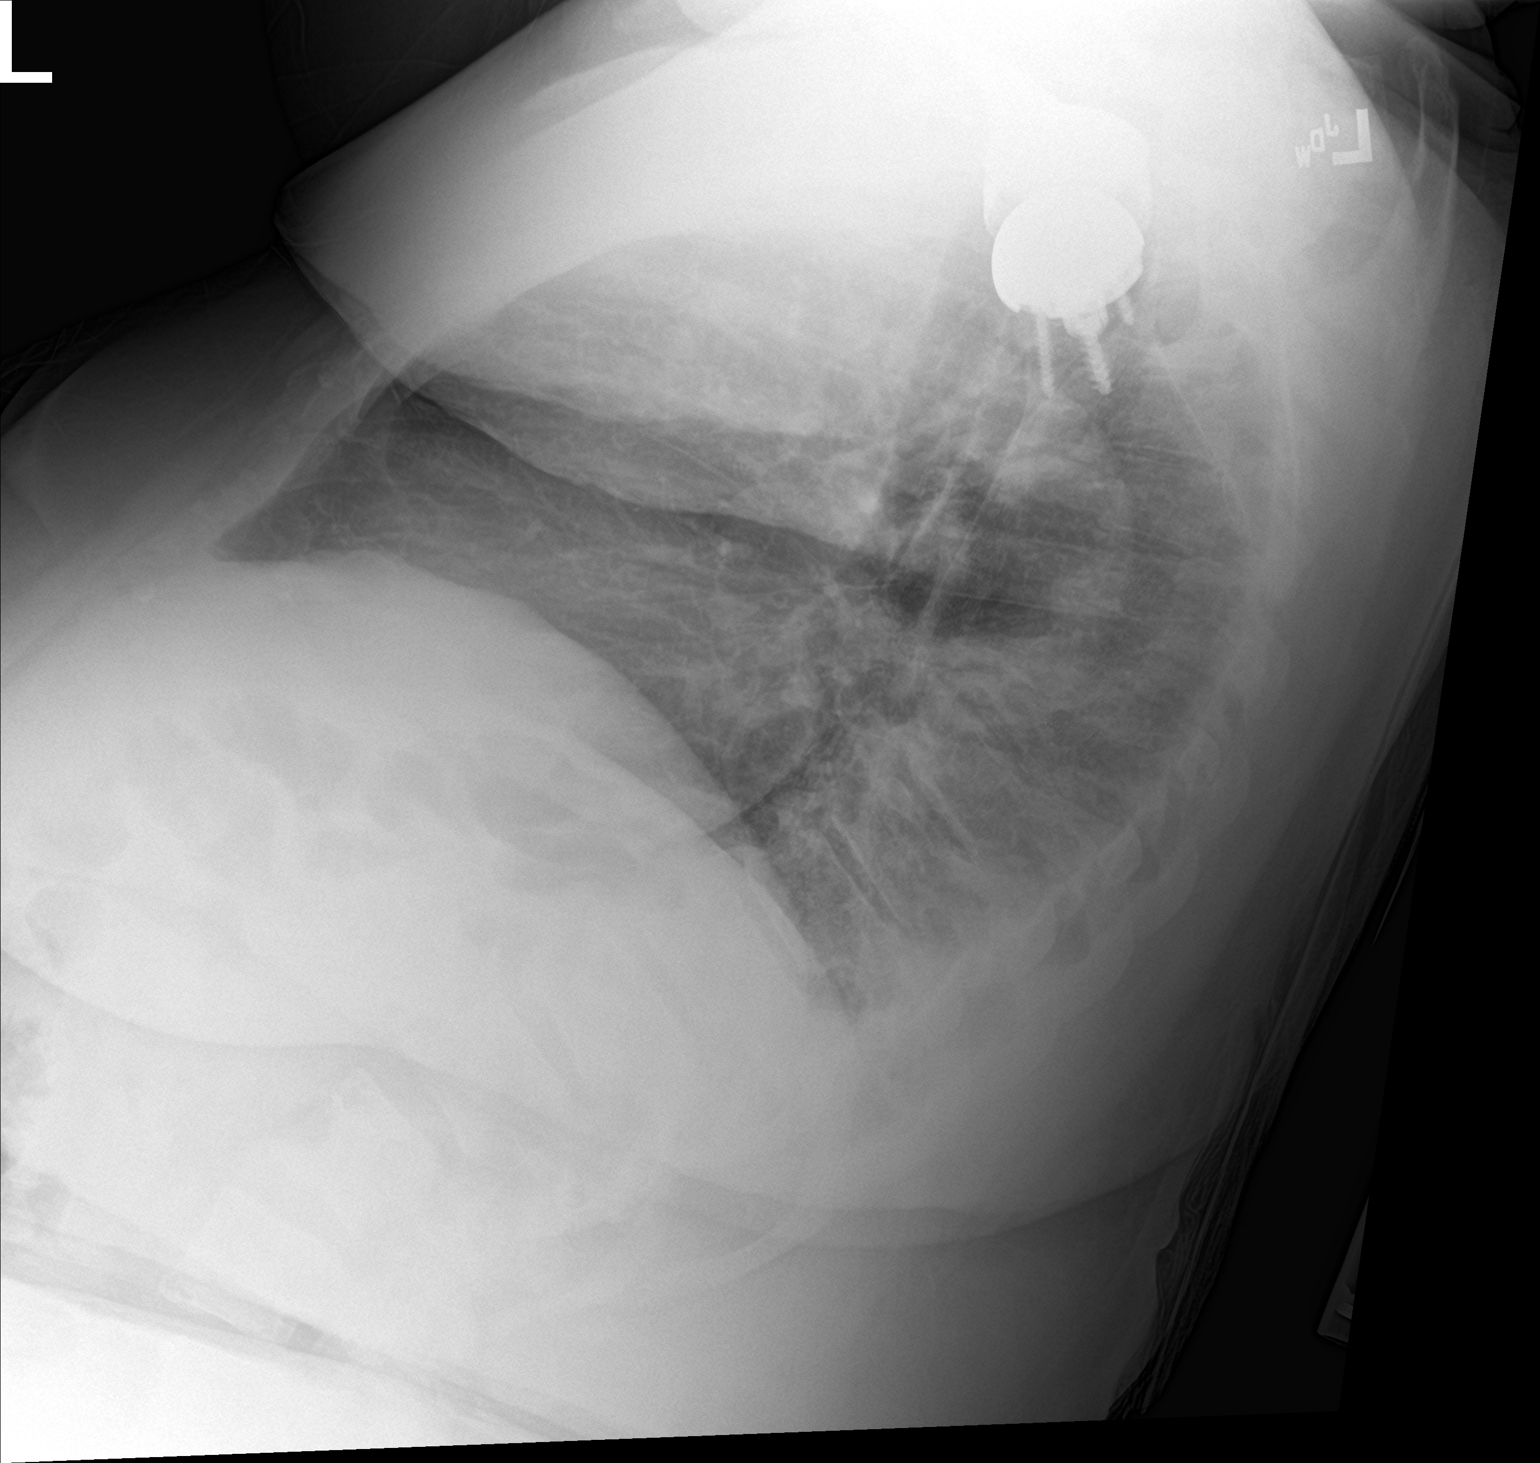

[chest ap]
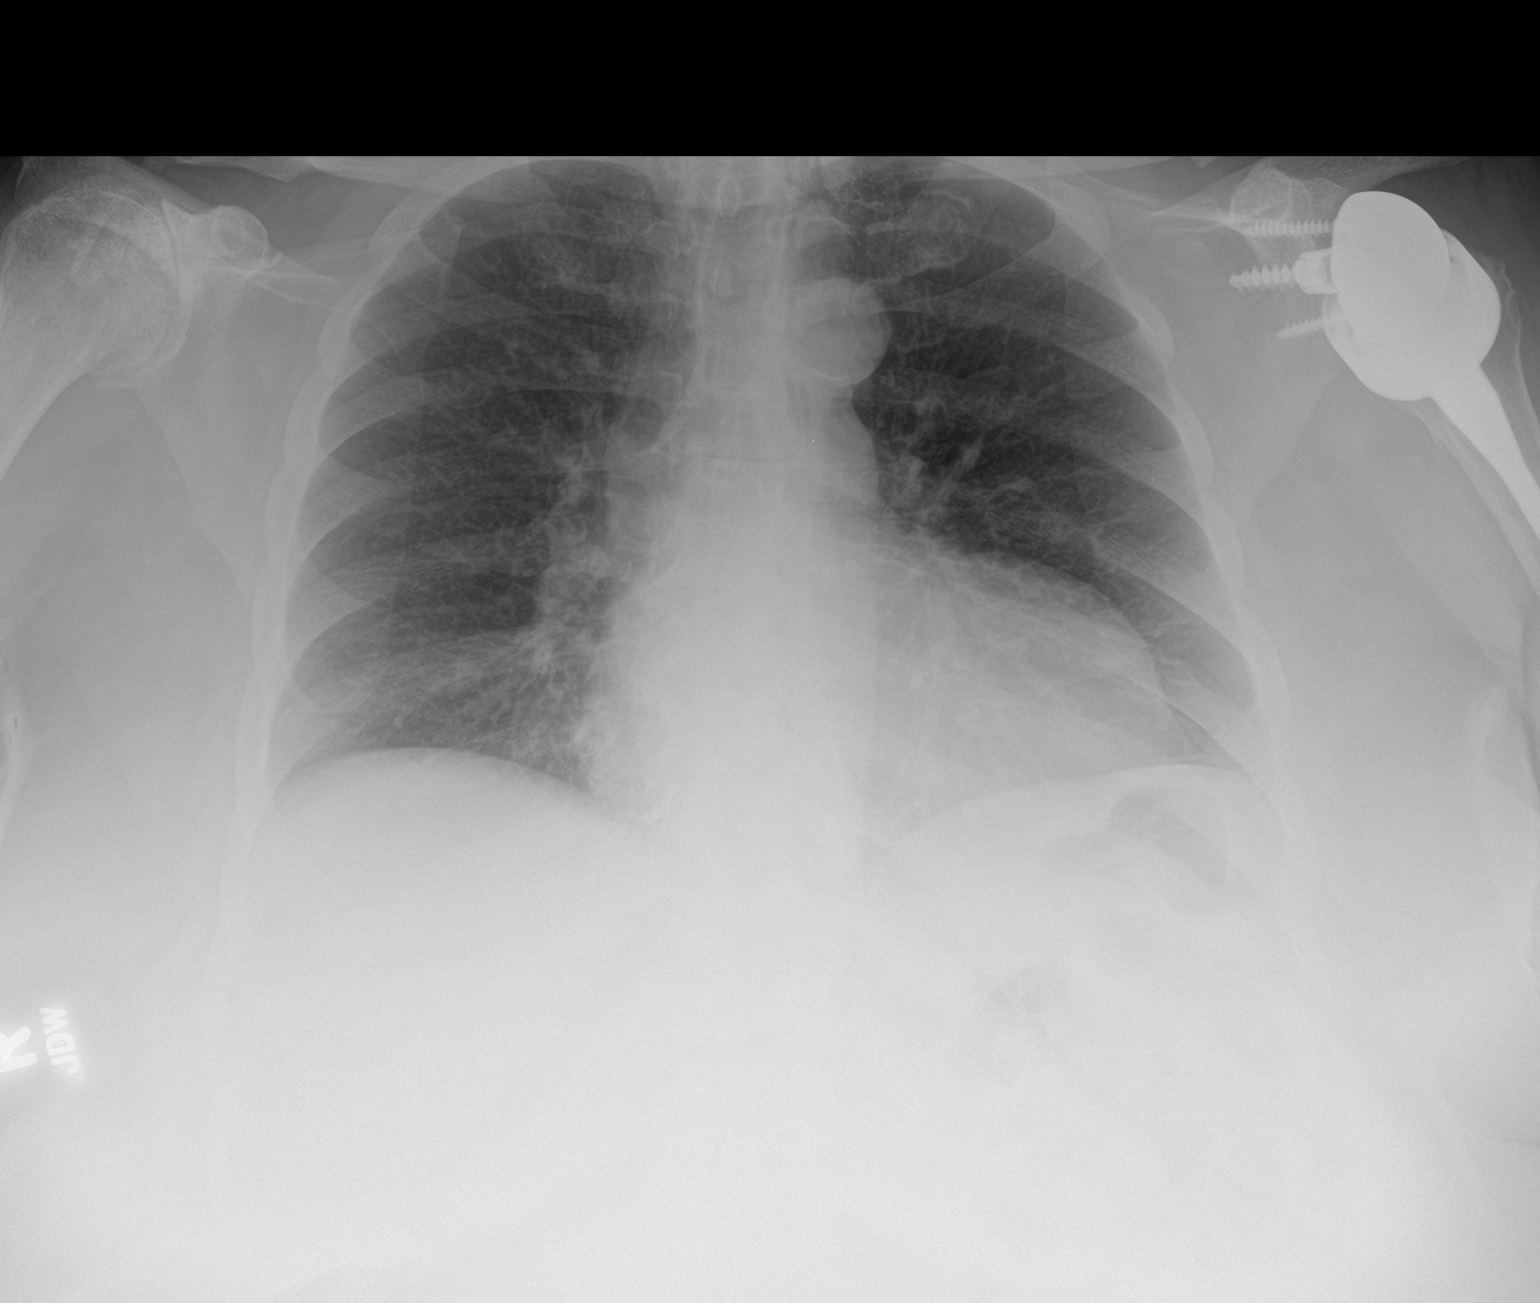

[2 of 2 positions shown; findings below may reference images not displayed]

FINDINGS: Cardiac silhouette is mildly enlarged. Calcified aortic knob. Mild
bronchitic changes without pleural effusion or focal consolidation.
No pneumothorax. LEFT shoulder arthroplasty. Severe degenerative
change of the RIGHT shoulder. Large body habitus. Mild degenerative
change of thoracic spine.
IMPRESSION: Stable cardiomegaly and mild bronchitic changes.

Aortic Atherosclerosis ([id number]).

## 2019-02-24 ENCOUNTER — Other Ambulatory Visit: Payer: Self-pay | Admitting: Internal Medicine

## 2019-02-24 DIAGNOSIS — Z1231 Encounter for screening mammogram for malignant neoplasm of breast: Secondary | ICD-10-CM

## 2019-03-23 ENCOUNTER — Ambulatory Visit: Payer: Federal, State, Local not specified - PPO | Admitting: Podiatry

## 2019-03-24 ENCOUNTER — Ambulatory Visit: Payer: Medicare Other | Admitting: Podiatry

## 2019-03-25 ENCOUNTER — Ambulatory Visit: Payer: Medicare Other

## 2019-05-13 ENCOUNTER — Ambulatory Visit
Admission: RE | Admit: 2019-05-13 | Discharge: 2019-05-13 | Disposition: A | Discharge status: Home / Self Care | Payer: Medicare Other | Source: Ambulatory Visit | Attending: Internal Medicine | Admitting: Internal Medicine

## 2019-05-13 ENCOUNTER — Other Ambulatory Visit: Payer: Self-pay

## 2019-05-13 DIAGNOSIS — Z1231 Encounter for screening mammogram for malignant neoplasm of breast: Secondary | ICD-10-CM

## 2019-06-29 DIAGNOSIS — I10 Essential (primary) hypertension: Secondary | ICD-10-CM | POA: Insufficient documentation

## 2019-07-05 ENCOUNTER — Ambulatory Visit (INDEPENDENT_AMBULATORY_CARE_PROVIDER_SITE_OTHER): Payer: Medicare Other | Admitting: Podiatry

## 2019-07-05 ENCOUNTER — Encounter: Payer: Self-pay | Admitting: Podiatry

## 2019-07-05 ENCOUNTER — Other Ambulatory Visit: Payer: Self-pay

## 2019-07-05 DIAGNOSIS — E119 Type 2 diabetes mellitus without complications: Secondary | ICD-10-CM | POA: Insufficient documentation

## 2019-07-05 DIAGNOSIS — M79675 Pain in left toe(s): Secondary | ICD-10-CM

## 2019-07-05 DIAGNOSIS — M79674 Pain in right toe(s): Secondary | ICD-10-CM | POA: Diagnosis not present

## 2019-07-05 DIAGNOSIS — B351 Tinea unguium: Secondary | ICD-10-CM

## 2019-07-05 NOTE — Progress Notes (Signed)
This patient presents to the office with chief complaint of long thick nails and diabetic feet.  This patient  says there  is  no pain and discomfort in her  feet.  This patient says there are long thick painful great  nails.  These nails are painful walking and wearing shoes.  Patient has no history of infection or drainage from both feet.  Patient is unable to  self treat his own nails . This patient presents  to the office today for treatment of the  long nails and a foot evaluation due to history of  diabetes.  General Appearance  Alert, conversant and in no acute stress.  Vascular  Dorsalis pedis and posterior tibial  pulses are palpable right.  Dorsalis pedis and posterior tibial pulses are absent  B/L.Marland Kitchen  Capillary return is within normal limits  bilaterally. Temperature is within normal limits  bilaterally.  Neurologic  Senn-Weinstein monofilament wire test within normal limits  bilaterally. Muscle power within normal limits bilaterally.  Nails Thick disfigured discolored nails with subungual debris  from hallux to fifth toes bilaterally. No evidence of bacterial infection or drainage bilaterally.  Orthopedic  No limitations of motion of motion feet .  No crepitus or effusions noted.  No bony pathology or digital deformities noted. 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  Pes planus.  Deviated toes  B/L.  Skin  normotropic skin with no porokeratosis noted bilaterally.  No signs of infections or ulcers noted.     Onychomycosis  Diabetes with no foot complications  Debride nails  X 2.   A diabetic foot exam was performed and there is no   evidence of an neurologic pathology. .  Vascular pathology was noted.    RTC 3 months.   Gardiner Barefoot DPM

## 2019-11-08 ENCOUNTER — Ambulatory Visit: Payer: Medicare Other | Admitting: Podiatry

## 2020-03-01 DIAGNOSIS — N184 Chronic kidney disease, stage 4 (severe): Secondary | ICD-10-CM | POA: Insufficient documentation

## 2020-04-16 ENCOUNTER — Other Ambulatory Visit: Payer: Self-pay | Admitting: Internal Medicine

## 2020-04-16 DIAGNOSIS — Z1231 Encounter for screening mammogram for malignant neoplasm of breast: Secondary | ICD-10-CM

## 2020-05-15 ENCOUNTER — Ambulatory Visit
Admission: RE | Admit: 2020-05-15 | Discharge: 2020-05-15 | Disposition: A | Discharge status: Home / Self Care | Payer: Medicare Other | Source: Ambulatory Visit | Attending: Internal Medicine | Admitting: Internal Medicine

## 2020-05-15 ENCOUNTER — Other Ambulatory Visit: Payer: Self-pay

## 2020-05-15 DIAGNOSIS — Z1231 Encounter for screening mammogram for malignant neoplasm of breast: Secondary | ICD-10-CM

## 2021-03-04 ENCOUNTER — Emergency Department (HOSPITAL_COMMUNITY): Payer: Medicare Other

## 2021-03-04 ENCOUNTER — Other Ambulatory Visit: Payer: Self-pay | Admitting: Physician Assistant

## 2021-03-04 ENCOUNTER — Encounter (HOSPITAL_COMMUNITY): Payer: Self-pay | Admitting: Emergency Medicine

## 2021-03-04 ENCOUNTER — Emergency Department (HOSPITAL_COMMUNITY)
Admission: EM | Admit: 2021-03-04 | Discharge: 2021-03-04 | Disposition: A | Discharge status: Home / Self Care | Payer: Medicare Other | Attending: Emergency Medicine | Admitting: Emergency Medicine

## 2021-03-04 DIAGNOSIS — Z7984 Long term (current) use of oral hypoglycemic drugs: Secondary | ICD-10-CM | POA: Insufficient documentation

## 2021-03-04 DIAGNOSIS — J45909 Unspecified asthma, uncomplicated: Secondary | ICD-10-CM | POA: Insufficient documentation

## 2021-03-04 DIAGNOSIS — N184 Chronic kidney disease, stage 4 (severe): Secondary | ICD-10-CM | POA: Diagnosis not present

## 2021-03-04 DIAGNOSIS — I129 Hypertensive chronic kidney disease with stage 1 through stage 4 chronic kidney disease, or unspecified chronic kidney disease: Secondary | ICD-10-CM | POA: Diagnosis not present

## 2021-03-04 DIAGNOSIS — Z79899 Other long term (current) drug therapy: Secondary | ICD-10-CM | POA: Diagnosis not present

## 2021-03-04 DIAGNOSIS — R079 Chest pain, unspecified: Secondary | ICD-10-CM | POA: Diagnosis present

## 2021-03-04 DIAGNOSIS — Z7982 Long term (current) use of aspirin: Secondary | ICD-10-CM | POA: Diagnosis not present

## 2021-03-04 DIAGNOSIS — E1122 Type 2 diabetes mellitus with diabetic chronic kidney disease: Secondary | ICD-10-CM | POA: Insufficient documentation

## 2021-03-04 DIAGNOSIS — M549 Dorsalgia, unspecified: Secondary | ICD-10-CM | POA: Diagnosis not present

## 2021-03-04 DIAGNOSIS — Z96612 Presence of left artificial shoulder joint: Secondary | ICD-10-CM | POA: Insufficient documentation

## 2021-03-04 DIAGNOSIS — M546 Pain in thoracic spine: Secondary | ICD-10-CM

## 2021-03-04 LAB — CBC WITH DIFFERENTIAL/PLATELET
Abs Immature Granulocytes: 0.03 10*3/uL (ref 0.00–0.07)
Basophils Absolute: 0.1 10*3/uL (ref 0.0–0.1)
Basophils Relative: 1 %
Eosinophils Absolute: 0.3 10*3/uL (ref 0.0–0.5)
Eosinophils Relative: 4 %
HCT: 38.2 % (ref 36.0–46.0)
Hemoglobin: 11.8 g/dL — ABNORMAL LOW (ref 12.0–15.0)
Immature Granulocytes: 0 %
Lymphocytes Relative: 18 %
Lymphs Abs: 1.3 10*3/uL (ref 0.7–4.0)
MCH: 28.9 pg (ref 26.0–34.0)
MCHC: 30.9 g/dL (ref 30.0–36.0)
MCV: 93.6 fL (ref 80.0–100.0)
Monocytes Absolute: 0.6 10*3/uL (ref 0.1–1.0)
Monocytes Relative: 8 %
Neutro Abs: 4.9 10*3/uL (ref 1.7–7.7)
Neutrophils Relative %: 69 %
Platelets: 216 10*3/uL (ref 150–400)
RBC: 4.08 MIL/uL (ref 3.87–5.11)
RDW: 13.9 % (ref 11.5–15.5)
WBC: 7.1 10*3/uL (ref 4.0–10.5)
nRBC: 0 % (ref 0.0–0.2)

## 2021-03-04 LAB — COMPREHENSIVE METABOLIC PANEL
ALT: 14 U/L (ref 0–44)
AST: 19 U/L (ref 15–41)
Albumin: 3.5 g/dL (ref 3.5–5.0)
Alkaline Phosphatase: 89 U/L (ref 38–126)
Anion gap: 10 (ref 5–15)
BUN: 48 mg/dL — ABNORMAL HIGH (ref 8–23)
CO2: 21 mmol/L — ABNORMAL LOW (ref 22–32)
Calcium: 8.8 mg/dL — ABNORMAL LOW (ref 8.9–10.3)
Chloride: 109 mmol/L (ref 98–111)
Creatinine, Ser: 3.54 mg/dL — ABNORMAL HIGH (ref 0.44–1.00)
GFR, Estimated: 13 mL/min — ABNORMAL LOW (ref >60)
Glucose, Bld: 148 mg/dL — ABNORMAL HIGH (ref 70–99)
Potassium: 4.3 mmol/L (ref 3.5–5.1)
Sodium: 140 mmol/L (ref 135–145)
Total Bilirubin: 0.9 mg/dL (ref 0.3–1.2)
Total Protein: 6.4 g/dL — ABNORMAL LOW (ref 6.5–8.1)

## 2021-03-04 LAB — LIPASE, BLOOD: Lipase: 29 U/L (ref 11–51)

## 2021-03-04 LAB — TROPONIN I (HIGH SENSITIVITY)
Troponin I (High Sensitivity): 15 ng/L (ref <18)
Troponin I (High Sensitivity): 15 ng/L (ref <18)

## 2021-03-04 MED ORDER — HYDROCODONE-ACETAMINOPHEN 5-325 MG PO TABS: 1.0000 | ORAL_TABLET | ORAL | 0 refills | Status: DC | PRN

## 2021-03-04 MED ORDER — OXYCODONE-ACETAMINOPHEN 5-325 MG PO TABS
1.0000 | ORAL_TABLET | Freq: Once | ORAL | Status: AC
  Administered 2021-03-04: 1 via ORAL
  Filled 2021-03-04: qty 1

## 2021-03-04 MED ORDER — ONDANSETRON HCL 4 MG/2ML IJ SOLN
4.0000 mg | Freq: Once | INTRAMUSCULAR | Status: AC
  Administered 2021-03-04: 4 mg via INTRAVENOUS
  Filled 2021-03-04: qty 2

## 2021-03-04 MED ORDER — MORPHINE SULFATE (PF) 4 MG/ML IV SOLN
4.0000 mg | Freq: Once | INTRAVENOUS | Status: AC
  Administered 2021-03-04: 4 mg via INTRAVENOUS
  Filled 2021-03-04: qty 1

## 2021-03-04 NOTE — Progress Notes (Signed)
   03/04/21 0857  TOC ED Mini Assessment  TOC Time spent with patient (minutes): 30  TOC Time saved using PING (minutes): 20  PING Used in TOC Assessment Yes  Admission or Readmission Diverted Yes  Interventions which prevented an admission or readmission Medication Review;Home Health Consult or Services  What brought you to the Emergency Department?  chest pain  Means of departure Hospital Transport  Patient states their goals for this hospitalization and ongoing recovery are: To be pain free  CMS Medicare.gov Compare Post Acute Care list provided to: Patient  Choice offered to / list presented to  Patient    Tennile Styles J. Clydene Laming, RN, BSN, [place] [phone] RNCM spoke with pt at bedside regarding discharge planning for Colleen Gray. Offered pt medicare.gov list of home health agencies to choose from.  Pt chose [facility] to render services. Tommi Rumps of [facility] notified. Patient made aware that [facility] will be in contact in 24-48 hours.  No DME needs identified at this time.

## 2021-03-04 NOTE — Discharge Planning (Signed)
RNCM consulted regarding pt with limited transportation requiring Rx.  RNCM suggested sending Rx to [place] to be filled and delivered to pt prior to discharge.

## 2021-03-04 NOTE — ED Provider Notes (Addendum)
[facility] EMERGENCY DEPARTMENT Provider Note   CSN: [id number] Arrival date & time: 03/04/21  0042     History Chief Complaint  Patient presents with  . Chest Pain    Colleen Gray is a 70 y.o. female.  Patient presents to the emergency department for evaluation of chest pain.  Patient reports that the pain has been present all day.  Pain was minimal when she woke up this morning and has worsened through the day.  Over the last couple of hours she has noticed sharp pains in the center of her chest that radiate all the way through to her back.  These pains are exacerbated by any movement of the torso.  Patient comes from home by EMS.  She was given aspirin and nitro with no improvement.     HPI: A 70 year old patient with a history of treated diabetes, hypertension and obesity presents for evaluation of chest pain. Initial onset of pain was more than 6 hours ago. The patient's chest pain is sharp and is not worse with exertion. The patient's chest pain is middle- or left-sided, is not well-localized, is not described as heaviness/pressure/tightness and does not radiate to the arms/jaw/neck. The patient does not complain of nausea and denies diaphoresis. The patient has no history of stroke, has no history of peripheral artery disease, has not smoked in the past 90 days, has no relevant family history of coronary artery disease (first degree relative at less than age 25) and has no history of hypercholesterolemia.   Past Medical History:  Diagnosis Date  . Anemia   . Arthritis   . Asthma   . Bradycardia   . Chronic renal failure, stage 4 (severe) ([place]) 2017   no dialysis at this time  . Enlarged heart    Sees Dr. Einar Gip  . Heart murmur    due to rheumatic fever  . Hemorrhoid   . History of rheumatic fever    with a heart murmer  . Hypertension   . Ovarian cyst 1985   removed  . Pneumonia   . Sleep apnea    does not use Cpap (after weight loss no longer  needed)    Patient Active Problem List   Diagnosis Date Noted  . Pain due to onychomycosis of toenails of both feet 07/05/2019  . Diabetes mellitus without complication ([place]) 32/67/1245  . Carpal tunnel syndrome 02/06/2016  . 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 Atrophic vaginitis 11/10/2012    Past Surgical History:  Procedure Laterality Date  . BUNIONECTOMY WITH HAMMERTOE RECONSTRUCTION Bilateral 1980's; 1999; 2009    left; right; left 'w/plates and screws"  . COLONOSCOPY  2016   [facility] of colon cancer  . DILATION AND CURETTAGE OF UTERUS  early 1980's   for missed AB  . JOINT REPLACEMENT    . LAPAROSCOPIC GASTRIC BYPASS  2007  . LAPAROSCOPIC OVARIAN CYSTECTOMY Right 1985  . REVERSE SHOULDER ARTHROPLASTY Left 11/22/2015   Procedure: LEFT REVERSE SHOULDER ARTHROPLASTY;  Surgeon: Justice Britain, MD;  Location: [facility];  Service: Orthopedics;  Laterality: Left;  . ROTATOR CUFF REPAIR Right 2006     OB History    Gravida  1   Para  0   Term  0   Preterm  0   AB  1   Living  0     SAB  1   IAB  0   Ectopic  0   Multiple  0   Live Births  0  Family History  Problem Relation Age of Onset  . Colon cancer Mother 37  . Diabetes Mother   . Hypertension Mother   . Diabetes Father   . Hypertension Father   . Colon cancer Maternal Grandmother 96  . Diabetes Paternal Grandmother   . Diabetes Paternal Grandfather     Social History   Tobacco Use  . Smoking status: Never Smoker  . Smokeless tobacco: Never Used  Substance Use Topics  . Alcohol use: No    Alcohol/week: 0.0 standard drinks  . Drug use: No    Home Medications Prior to Admission medications   Medication Sig Start Date End Date Taking? Authorizing Provider  albuterol (VENTOLIN HFA) 108 (90 Base) MCG/ACT inhaler Inhale into the lungs. 10/22/13   [provider]  Alpha-Lipoic Acid 300 MG CAPS Take by mouth.    [provider]  amLODipine (NORVASC) 10 MG tablet  03/18/19    [provider]  Aspirin Buf,CaCarb-MgCarb-MgO, 81 MG TABS Take by mouth.    [provider]  azithromycin (ZITHROMAX) 250 MG tablet Take 1 tablet (250 mg total) by mouth daily. Patient not taking: Reported on 07/05/2019 03/17/17   Kem Boroughs, FNP  Blood Glucose Monitoring Suppl (GLUCOCOM BLOOD GLUCOSE MONITOR) DEVI Use daily to check blood sugar. 06/29/18   [provider]  calcitRIOL (ROCALTROL) 0.5 MCG capsule  06/25/19   [provider]  Calcium-Magnesium-Vitamin D (CALCIUM 1200+D3 PO) Take 1 tablet by mouth daily.    [provider]  Cobalamine Combinations (B-12) [id number] MCG SUBL Place 1 tablet under the tongue 3 (three) times a week.    [provider]  Cyanocobalamin (VITAMIN B-12) 5000 MCG SUBL Place under the tongue.    [provider]  folic acid (FOLVITE) 1 MG tablet Take by mouth. 06/29/19   [provider]  glipiZIDE (GLUCOTROL XL) 2.5 MG 24 hr tablet TAKE 1 TABLET BY MOUTH EVERY DAY 06/13/19   [provider]  HYDROcodone-acetaminophen (NORCO/VICODIN) 5-325 MG tablet Take 1-2 tablets by mouth every 6 (six) hours as needed. 09/17/17   Montine Circle, PA-C  labetalol (NORMODYNE) 100 MG tablet  06/09/19   [provider]  lisinopril (PRINIVIL,ZESTRIL) 20 MG tablet Take 20 mg by mouth daily. 01/20/17   [provider]  Misc Natural Products (SUPER GREENS PO) Take 2 capsules by mouth daily.    [provider]  Multiple Vitamin (MULTIVITAMIN WITH MINERALS) TABS tablet Take 1 tablet by mouth daily.    [provider]  nitroGLYCERIN (NITROSTAT) 0.4 MG SL tablet  09/22/17   [provider]  Omega 3-6-9 Fatty Acids (OMEGA-3-6-9 PO) Take 1 capsule by mouth daily.    [provider]  omega-3 acid ethyl esters (LOVAZA) 1 g capsule Take by mouth.    [provider]  sevelamer carbonate (RENVELA) 800 MG tablet  04/21/17   [provider]  torsemide  (DEMADEX) 20 MG tablet TAKE 1 TABLET BY MOUTH EVERY DAY 09/07/17   [provider]  TURMERIC CURCUMIN PO Take 1 capsule by mouth daily.    [provider]  UNABLE TO FIND Take by mouth.    [provider]  UNABLE TO FIND Use daily to check blood sugar. 06/29/18   [provider]  UNABLE TO FIND Take by mouth.    [provider]  vitamin C (ASCORBIC ACID) 500 MG tablet Take 500 mg by mouth daily.    [provider]  Vitamin D, Ergocalciferol, (DRISDOL) 50000 units  CAPS capsule Take 1 capsule by mouth once a week. 01/15/17   [provider]    Allergies    Nifedipine, Penicillins, and Penicillin g  Review of Systems   Review of Systems  Cardiovascular: Positive for chest pain.  Musculoskeletal: Positive for back pain.  All other systems reviewed and are negative.   Physical Exam Updated Vital Signs BP (!) 149/79   Pulse (!) 53   Temp 98.4 F (36.9 C) (Oral)   Resp 15   LMP 12/23/2007 (Approximate)   SpO2 98%   Physical Exam Vitals and nursing note reviewed.  Constitutional:      General: She is not in acute distress.    Appearance: Normal appearance. She is well-developed.  HENT:     Head: Normocephalic and atraumatic.     Right Ear: Hearing normal.     Left Ear: Hearing normal.     Nose: Nose normal.  Eyes:     Conjunctiva/sclera: Conjunctivae normal.     Pupils: Pupils are equal, round, and reactive to light.  Cardiovascular:     Rate and Rhythm: Regular rhythm.     Heart sounds: S1 normal and S2 normal. No murmur heard. No friction rub. No gallop.   Pulmonary:     Effort: Pulmonary effort is normal. No respiratory distress.     Breath sounds: Normal breath sounds.  Chest:     Chest wall: Tenderness present.    Abdominal:     General: Bowel sounds are normal.     Palpations: Abdomen is soft.     Tenderness: There is no abdominal tenderness. There is no guarding or rebound. Negative signs include  Murphy's sign and McBurney's sign.     Hernia: No hernia is present.  Musculoskeletal:        General: Normal range of motion.     Cervical back: Normal range of motion and neck supple.     Thoracic back: Tenderness present.       Back:  Skin:    General: Skin is warm and dry.     Findings: No rash.  Neurological:     Mental Status: She is alert and oriented to person, place, and time.     GCS: GCS eye subscore is 4. GCS verbal subscore is 5. GCS motor subscore is 6.     Cranial Nerves: No cranial nerve deficit.     Sensory: No sensory deficit.     Coordination: Coordination normal.  Psychiatric:        Speech: Speech normal.        Behavior: Behavior normal.        Thought Content: Thought content normal.     ED Results / Procedures / Treatments   Labs (all labs ordered are listed, but only abnormal results are displayed) Labs Reviewed  CBC WITH DIFFERENTIAL/PLATELET - Abnormal; Notable for the following components:      Result Value   Hemoglobin 11.8 (*)    All other components within normal limits  COMPREHENSIVE METABOLIC PANEL - Abnormal; Notable for the following components:   CO2 21 (*)    Glucose, Bld 148 (*)    BUN 48 (*)    Creatinine, Ser 3.54 (*)    Calcium 8.8 (*)    Total Protein 6.4 (*)    GFR, Estimated 13 (*)    All other components within normal limits  LIPASE, BLOOD  TROPONIN I (HIGH SENSITIVITY)  TROPONIN I (HIGH SENSITIVITY)    EKG EKG Interpretation  Date/Time:  Monday March 04 2021 00:52:00 EDT Ventricular Rate:  66 PR Interval:    QRS Duration: 126 QT Interval:  416 QTC Calculation: 436 R Axis:   -81 Text Interpretation: Sinus rhythm Probable left atrial enlargement Nonspecific IVCD with LAD Confirmed by Orpah Greek [phone]) on 03/04/2021 1:11:19 AM   Radiology DG Chest 2 View  Result Date: 03/04/2021 CLINICAL DATA:  Chest pain EXAM: CHEST - 2 VIEW COMPARISON:  09/16/2017 FINDINGS: Advanced arthritis right shoulder. Left  shoulder replacement. Cardiomegaly. No consolidation pleural effusion or pneumothorax. Aortic atherosclerosis. IMPRESSION: Cardiomegaly without edema or infiltrate. Electronically Signed   By: Donavan Foil M.D.   On: 03/04/2021 01:43    Procedures Procedures   Medications Ordered in ED Medications  ondansetron (ZOFRAN) injection 4 mg (4 mg Intravenous Given 03/04/21 0140)  morphine 4 MG/ML injection 4 mg (4 mg Intravenous Given 03/04/21 0142)    ED Course  I have reviewed the triage vital signs and the nursing notes.  Pertinent labs & imaging results that were available during my care of the patient were reviewed by me and considered in my medical decision making (see chart for details).    MDM Rules/Calculators/A&P HEAR Score: 5                        Patient presents to the emergency department for evaluation of chest pain.  Patient experiencing pain in the center of her chest that goes into her back.  Pain began at rest.  Patient has very reproducible chest wall pain. She did not have any improvement after nitroglycerin.  Pain also is reproduced with any movement of the torso.  Symptoms appear to be musculoskeletal in nature.  EKG without evidence of ischemia.  2 troponins are negative.  Patient's labs are unremarkable other than CKD.  No mediastinal widening on x-ray, presentation does not fit with aortic dissection.    As pain is felt to be musculoskeletal in nature, will discharge with analgesia, prompt follow-up with primary care and her cardiologist, Dr. Einar Gip.  Return for worsening symptoms.  Final Clinical Impression(s) / ED Diagnoses Final diagnoses:  Nonspecific chest pain    Rx / DC Orders ED Discharge Orders    None       Seraphim Trow, Gwenyth Allegra, MD 03/04/21 0422    Orpah Greek, MD 03/04/21 [phone]

## 2021-03-04 NOTE — ED Triage Notes (Signed)
Pt bib gems c/o of chest pain- onset 2 hours ago. Pt says the pain is in the center of her chest and radiates a small amount to her back. Pt reports the pain is increased with movement and changing positions. Pt also reports some SOB along with the chest pain. Pt given 1 nitro and 324mg  aspirin PTA.   EMS vitals:  BP: 170/90 HR: 80  Spo2: 98% RA RR: 16  CBG: 160

## 2021-03-04 NOTE — ED Notes (Signed)
Patient transported to X-ray 

## 2021-04-01 ENCOUNTER — Other Ambulatory Visit: Payer: Self-pay

## 2021-04-01 DIAGNOSIS — R635 Abnormal weight gain: Secondary | ICD-10-CM | POA: Insufficient documentation

## 2021-04-01 DIAGNOSIS — Z800 Family history of malignant neoplasm of digestive organs: Secondary | ICD-10-CM | POA: Insufficient documentation

## 2021-04-01 DIAGNOSIS — Z1211 Encounter for screening for malignant neoplasm of colon: Secondary | ICD-10-CM | POA: Insufficient documentation

## 2021-04-01 DIAGNOSIS — D509 Iron deficiency anemia, unspecified: Secondary | ICD-10-CM | POA: Insufficient documentation

## 2021-04-01 DIAGNOSIS — N184 Chronic kidney disease, stage 4 (severe): Secondary | ICD-10-CM

## 2021-04-02 ENCOUNTER — Other Ambulatory Visit (HOSPITAL_COMMUNITY): Payer: Self-pay

## 2021-04-23 ENCOUNTER — Other Ambulatory Visit: Payer: Self-pay

## 2021-04-23 ENCOUNTER — Encounter: Payer: Self-pay | Admitting: Vascular Surgery

## 2021-04-23 ENCOUNTER — Ambulatory Visit (INDEPENDENT_AMBULATORY_CARE_PROVIDER_SITE_OTHER)
Admission: RE | Admit: 2021-04-23 | Discharge: 2021-04-23 | Disposition: A | Discharge status: Home / Self Care | Payer: Medicare Other | Source: Ambulatory Visit | Attending: Vascular Surgery | Admitting: Vascular Surgery

## 2021-04-23 ENCOUNTER — Ambulatory Visit (HOSPITAL_COMMUNITY)
Admission: RE | Admit: 2021-04-23 | Discharge: 2021-04-23 | Disposition: A | Discharge status: Home / Self Care | Payer: Medicare Other | Source: Ambulatory Visit | Attending: Vascular Surgery | Admitting: Vascular Surgery

## 2021-04-23 ENCOUNTER — Ambulatory Visit (INDEPENDENT_AMBULATORY_CARE_PROVIDER_SITE_OTHER): Payer: Medicare Other | Admitting: Vascular Surgery

## 2021-04-23 DIAGNOSIS — N184 Chronic kidney disease, stage 4 (severe): Secondary | ICD-10-CM | POA: Diagnosis not present

## 2021-04-23 DIAGNOSIS — N185 Chronic kidney disease, stage 5: Secondary | ICD-10-CM

## 2021-04-23 NOTE — Progress Notes (Signed)
Patient name: Colleen Gray MRN: [id number] DOB: [birth year] Sex: female  REASON FOR CONSULT: Evaluate new hemodialysis access  HPI: Colleen Gray is a 70 y.o. female, with history of hypertension and diabetes and stage V CKD who presents for evaluation of new dialysis access.  Patient states she is left-handed.  She has had no previous dialysis access in the past.  She has no chest wall implants.  She does walk with a cane.  Nephrology referral states place AVF now and wait on AVG.   Past Medical History:  Diagnosis Date  . Anemia   . Arthritis   . Asthma   . Bradycardia   . Chronic renal failure, stage 4 (severe) ([place]) 19-Mar-2016   no dialysis at this time  . Enlarged heart    Sees Dr. Einar Gip  . Heart murmur    due to rheumatic fever  . Hemorrhoid   . History of rheumatic fever    with a heart murmer  . Hypertension   . Ovarian cyst 1985   removed  . Pneumonia   . Sleep apnea    does not use Cpap (after weight loss no longer needed)    Past Surgical History:  Procedure Laterality Date  . BUNIONECTOMY WITH HAMMERTOE RECONSTRUCTION Bilateral 1980's; 03/19/1998; March 19, 2008    left; right; left 'w/plates and screws"  . COLONOSCOPY  03/20/2015   [facility] of colon cancer  . DILATION AND CURETTAGE OF UTERUS  early 1980's   for missed AB  . JOINT REPLACEMENT    . LAPAROSCOPIC GASTRIC BYPASS  03/19/06  . LAPAROSCOPIC OVARIAN CYSTECTOMY Right 1985  . REVERSE SHOULDER ARTHROPLASTY Left 11/22/2015   Procedure: LEFT REVERSE SHOULDER ARTHROPLASTY;  Surgeon: Justice Britain, MD;  Location: [facility];  Service: Orthopedics;  Laterality: Left;  . ROTATOR CUFF REPAIR Right 03-19-2005    Family History  Problem Relation Age of Onset  . Colon cancer Mother 72  . Diabetes Mother   . Hypertension Mother   . Diabetes Father   . Hypertension Father   . Colon cancer Maternal Grandmother 03/20/95  . Diabetes Paternal Grandmother   . Diabetes Paternal Grandfather     SOCIAL HISTORY: Social History   Socioeconomic History   . Marital status: Widowed    Spouse name: Not on file  . Number of children: 0  . Years of education: Not on file  . Highest education level: Not on file  Occupational History  . Not on file  Tobacco Use  . Smoking status: Never Smoker  . Smokeless tobacco: Never Used  Substance and Sexual Activity  . Alcohol use: No    Alcohol/week: 0.0 standard drinks  . Drug use: No  . Sexual activity: Yes    Partners: Male    Birth control/protection: Post-menopausal  Other Topics Concern  . Not on file  Social History Narrative   Pt husband died in 2012/03/19 from esophageal rupture.  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   Social Determinants of Health   Financial Resource Strain: Not on file  Food Insecurity: Not on file  Transportation Needs: Not on file  Physical Activity: Not on file  Stress: Not on file  Social Connections: Not on file  Intimate Partner Violence: Not on file    Allergies  Allergen Reactions  . Nifedipine Swelling  . Penicillins Other (See Comments)    Skin rash  Skin rash   . Penicillin G Rash    Current Outpatient Medications  Medication Sig Dispense Refill  . albuterol (VENTOLIN HFA) 108 (90 Base) MCG/ACT inhaler Inhale into the lungs.    . Alpha-Lipoic Acid 300 MG CAPS Take by mouth.    Marland Kitchen amLODipine (NORVASC) 10 MG tablet     . calcitRIOL (ROCALTROL) 0.5 MCG capsule     . Calcium-Magnesium-Vitamin D (CALCIUM 1200+D3 PO) Take 1 tablet by mouth daily.    . Cobalamine Combinations (B-12) [phone] MCG SUBL Place 1 tablet under the tongue 3 (three) times a week.    . folic acid (FOLVITE) 1 MG tablet Take by mouth.    . Multiple Vitamin (MULTIVITAMIN WITH MINERALS) TABS tablet Take 1 tablet by mouth daily.    . nitroGLYCERIN (NITROSTAT) 0.4 MG SL tablet     . Omega 3-6-9 Fatty Acids (OMEGA-3-6-9 PO) Take 1 capsule by mouth daily.    . sevelamer carbonate  (RENVELA) 800 MG tablet     . vitamin C (ASCORBIC ACID) 500 MG tablet Take 500 mg by mouth daily.    . Vitamin D, Ergocalciferol, (DRISDOL) 50000 units CAPS capsule Take 1 capsule by mouth once a week.  3  . Aspirin Buf,CaCarb-MgCarb-MgO, 81 MG TABS Take by mouth. (Patient not taking: Reported on 04/23/2021)    . azithromycin (ZITHROMAX) 250 MG tablet Take 1 tablet (250 mg total) by mouth daily. (Patient not taking: No sig reported) 6 tablet 0  . Blood Glucose Monitoring Suppl (GLUCOCOM BLOOD GLUCOSE MONITOR) DEVI Use daily to check blood sugar. (Patient not taking: Reported on 04/23/2021)    . Cyanocobalamin (VITAMIN B-12) 5000 MCG SUBL Place under the tongue. (Patient not taking: Reported on 04/23/2021)    . glipiZIDE (GLUCOTROL XL) 2.5 MG 24 hr tablet TAKE 1 TABLET BY MOUTH EVERY DAY (Patient not taking: Reported on 04/23/2021)    . HYDROcodone-acetaminophen (NORCO/VICODIN) 5-325 MG tablet Take 1 tablet by mouth every 4 (four) hours as needed for moderate pain. (Patient not taking: Reported on 04/23/2021) 10 tablet 0  . HYDROcodone-acetaminophen (NORCO/VICODIN) 5-325 MG tablet TAKE 1 TABLET BY MOUTH EVERY FOUR HOURS AS NEEDED FOR MODERATE PAIN. (Patient not taking: Reported on 04/23/2021) 10 tablet 0  . labetalol (NORMODYNE) 100 MG tablet 200 mg 2 (two) times daily.    Marland Kitchen lisinopril (PRINIVIL,ZESTRIL) 20 MG tablet Take 20 mg by mouth daily. (Patient not taking: Reported on 04/23/2021)  0  . Misc Natural Products (SUPER GREENS PO) Take 2 capsules by mouth daily.    Marland Kitchen omega-3 acid ethyl esters (LOVAZA) 1 g capsule Take by mouth. (Patient not taking: Reported on 04/23/2021)    . torsemide (DEMADEX) 20 MG tablet TAKE 1 TABLET BY MOUTH EVERY DAY (Patient not taking: Reported on 04/23/2021)    . TURMERIC CURCUMIN PO Take 1 capsule by mouth daily. (Patient not taking: Reported on 04/23/2021)    . UNABLE TO FIND Take by mouth.    Marland Kitchen UNABLE TO FIND Use daily to check blood sugar.    Marland Kitchen UNABLE TO FIND Take by mouth.     No  current facility-administered medications for this visit.    REVIEW OF SYSTEMS:  [X]  denotes positive finding, [ ]  denotes negative finding Cardiac  Comments:  Chest pain or chest pressure:    Shortness of breath upon exertion:    Short of breath when lying flat:    Irregular heart rhythm:        Vascular    Pain in calf,  thigh, or hip brought on by ambulation: x   Pain in feet at night that wakes you up from your sleep:     Blood clot in your veins:    Leg swelling:         Pulmonary    Oxygen at home:    Productive cough:     Wheezing:         Neurologic    Sudden weakness in arms or legs:     Sudden numbness in arms or legs:     Sudden onset of difficulty speaking or slurred speech:    Temporary loss of vision in one eye:     Problems with dizziness:         Gastrointestinal    Blood in stool:     Vomited blood:         Genitourinary    Burning when urinating:     Blood in urine:        Psychiatric    Major depression:         Hematologic    Bleeding problems:    Problems with blood clotting too easily:        Skin    Rashes or ulcers:        Constitutional    Fever or chills:      PHYSICAL EXAM: Vitals:   04/23/21 1205  BP: (!) 145/69  Pulse: (!) 51  Resp: 16  Temp: 98 F (36.7 C)  TempSrc: Temporal  SpO2: 97%  Weight: 273 lb (123.8 kg)  Height: 5\' 3"  (1.6 m)    GENERAL: The patient is a well-nourished female, in no acute distress. The vital signs are documented above. CARDIAC: There is a regular rate and rhythm.  VASCULAR:  Palpable radial brachial pulses bilateral upper extremities No chest wall implants PULMONARY: No respiratory distress. ABDOMEN: Soft and non-tender. MUSCULOSKELETAL: There are no major deformities or cyanosis. NEUROLOGIC: No focal weakness or paresthesias are detected. SKIN: There are no ulcers or rashes noted. PSYCHIATRIC: The patient has a normal affect.  DATA:   Upper extremity arterial duplex shows triphasic  waveforms of both upper extremities  Vein mapping shows usable cephalic and basilic veins in both upper extremities  Assessment/Plan:  70 year old female with stage V CKD that presents for new dialysis access evaluation.  I discussed with her in detail plan for placement in the nondominant arm which would be her right arm given she is left-handed.  She has a very nice cephalic and basilic vein in the right arm and we discussed potentially using the cephalic vein as our initial choice.  I discussed risk and benefits including risk of steal, failure to mature, bleeding, infection etc.  We also discussed potential risk of needing to transpose the fistula in the future if it remains too deep to cannulate.  We will get her scheduled at her convenience in the near future.   Marty Heck, MD [facility] Office: [phone]

## 2021-04-26 ENCOUNTER — Other Ambulatory Visit: Payer: Self-pay | Admitting: Internal Medicine

## 2021-04-26 DIAGNOSIS — Z1231 Encounter for screening mammogram for malignant neoplasm of breast: Secondary | ICD-10-CM

## 2021-05-21 ENCOUNTER — Telehealth: Payer: Self-pay

## 2021-05-21 ENCOUNTER — Other Ambulatory Visit: Payer: Self-pay

## 2021-05-21 NOTE — Telephone Encounter (Signed)
Patient called to request AVF laterality for 05/24/2021 be changed to left. Patient is left handed, but due to shoulder surgery does not have full range of that arm. Discussed with CJC, patient has adequate vein on left arm - changed surgical site - patient aware.

## 2021-05-23 ENCOUNTER — Other Ambulatory Visit (HOSPITAL_COMMUNITY): Payer: Medicare Other

## 2021-05-23 ENCOUNTER — Encounter (HOSPITAL_COMMUNITY): Payer: Self-pay | Admitting: Vascular Surgery

## 2021-05-23 ENCOUNTER — Other Ambulatory Visit: Payer: Self-pay

## 2021-05-23 NOTE — Progress Notes (Signed)
Spoke with pt for pre-op call. Pt has hx of a heart murmur due to rheumatic fever when she was a teenager. Pt sees a cardiologist at [facility], next appt is 5 years away. Pt is pre-diabetic. Last A1C was 5.9 on 01/15/21.  Pt's surgery is scheduled as ambulatory so no Covid test is required prior to surgery. She denies any symptoms of Covid.  Pt will be arriving and leaving via [facility] transportation, her aunt will meet her here and ride home with patient.

## 2021-05-23 NOTE — Anesthesia Preprocedure Evaluation (Addendum)
Anesthesia Evaluation  Patient identified by MRN, date of birth, ID band Patient awake    Reviewed: Allergy & Precautions, NPO status , Patient's Chart, lab work & pertinent test results  History of Anesthesia Complications Negative for: history of anesthetic complications  Airway Mallampati: III  TM Distance: >3 FB Neck ROM: Full    Dental  (+) Dental Advisory Given   Pulmonary asthma , sleep apnea ,    breath sounds clear to auscultation       Cardiovascular hypertension, Pt. on medications + Valvular Problems/Murmurs  Rhythm:Regular     Neuro/Psych neg Seizures  Neuromuscular disease    GI/Hepatic negative GI ROS, Neg liver ROS,   Endo/Other  diabetes  Renal/GU CRFRenal disease     Musculoskeletal  (+) Arthritis ,   Abdominal   Peds  Hematology  (+) Blood dyscrasia, anemia ,   Anesthesia Other Findings   Reproductive/Obstetrics                                                             Anesthesia Evaluation  Patient identified by MRN, date of birth, ID band Patient awake    Reviewed: Allergy & Precautions, NPO status , Patient's Chart, lab work & pertinent test results  Airway Mallampati: II  TM Distance: >3 FB Neck ROM: Full    Dental no notable dental hx.    Pulmonary neg pulmonary ROS,    Pulmonary exam normal breath sounds clear to auscultation       Cardiovascular hypertension, Pt. on medications Normal cardiovascular exam Rhythm:Regular Rate:Normal     Neuro/Psych negative neurological ROS  negative psych ROS   GI/Hepatic negative GI ROS, Neg liver ROS,   Endo/Other  Morbid obesity  Renal/GU negative Renal ROS  negative genitourinary   Musculoskeletal negative musculoskeletal ROS (+)   Abdominal   Peds negative pediatric ROS (+)  Hematology negative hematology ROS (+)   Anesthesia Other Findings   Reproductive/Obstetrics negative  OB ROS                             Anesthesia Physical Anesthesia Plan  ASA: III  Anesthesia Plan: General   Post-op Pain Management: GA combined w/ Regional for post-op pain   Induction: Intravenous  Airway Management Planned: Oral ETT  Additional Equipment:   Intra-op Plan:   Post-operative Plan: Extubation in OR  Informed Consent: I have reviewed the patients History and Physical, chart, labs and discussed the procedure including the risks, benefits and alternatives for the proposed anesthesia with the patient or authorized representative who has indicated his/her understanding and acceptance.   Dental advisory given  Plan Discussed with: CRNA and Surgeon  Anesthesia Plan Comments:         Anesthesia Quick Evaluation  Anesthesia Physical Anesthesia Plan  ASA: III  Anesthesia Plan: MAC and Regional   Post-op Pain Management:    Induction: Intravenous  PONV Risk Score and Plan: 2 and Propofol infusion and Treatment may vary due to age or medical condition  Airway Management Planned: Nasal Cannula  Additional Equipment: None  Intra-op Plan:   Post-operative Plan:   Informed Consent: I have reviewed the patients History and Physical, chart, labs and discussed the procedure including the risks, benefits and alternatives for  the proposed anesthesia with the patient or authorized representative who has indicated his/her understanding and acceptance.     Dental advisory given  Plan Discussed with: CRNA and Surgeon  Anesthesia Plan Comments: (PAT note by [name], PA-C: Patient follows with cardiologist Dr. Einar Gip for history of HTN, atypical chest pain.  She previously underwent evaluation for chest pain in October 2018.  Nuclear stress was low risk, nonischemic.  Echo showed EF 55 to 60%, moderate LVH, grade 2 diastolic dysfunction, mild MR.  Patient recently seen at [facility], ED 03/04/2021 for chest pain.  Work-up benign,  felt to be noncardiac.  Per note, "Patient presents to the emergency department for evaluation of chest pain.  Patient experiencing pain in the center of her chest that goes into her back. Pain began at rest.  Patient has very reproducible chest wall pain. She did not have any improvement after nitroglycerin. Pain also is reproduced with any movement of the torso.  Symptoms appear to be musculoskeletal in nature.  EKG without evidence of ischemia. 2 troponins are negative.  Patient's labs are unremarkable other than CKD.  No mediastinal widening on x-ray, presentation does not fit with aortic dissection.  As pain is felt to be musculoskeletal in nature, will discharge with analgesia, prompt follow-up with primary care and her cardiologist, Dr. Einar Gip.  Return for worsening symptoms."  Patient is subsequently follow-up with her PCP Dr. Loistine Simas on 03/15/2021.  He denied chest pain at that time.  She was prescribed nitroglycerin to be used as needed for chest pain.  She recommended to make a follow-up appointment with Dr. Einar Gip.  She has not yet done this.  Reported history of OSA on CPAP, reportedly no longer using CPAP after weight loss.  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  Reviewed history with Dr. Ermalene Postin.  He advised patient okay to proceed as planned as long as she remains asymptomatic from cardiovascular standpoint.  She will be evaluated on day of surgery by assigned anesthesiologist.  EKG 03/04/21: Sinus rhythm.  Rate 66.  Probable LAE.  Nonspecific IVCD with LAD.  TTE 10/15/2017 (copy in chart): Left ventricle cavity is normal in size.  Moderate concentric hypertrophy of the left ventricle.  Normal global wall motion.  Elevated LAP.  Grade 2 diastolic dysfunction.  Mildly increased LVOT velocity likely due to LVH.  No S.A.M. or subaortic stenosis.  LVEF 55 to 60%.  LVH and diastolic dysfunction increased in severity compared to prior echocardiogram in 2016. Left atrial cavity is mildly dilated. Mild aortic valve  leaflet thickening.  Normal aortic valve leaflet mobility.  Trace aortic valve stenosis. Mild (grade 1) mitral regurgitation. Pulmonary artery systolic pressures estimated 25 to 30 mmHg.  Nuclear stress 09/28/2017 (copy on chart): 1.  Low risk of hemodynamically significant coronary artery disease.  Prognostically, this is a low risk study. 2.  Resting EKG IRBBB, nonspecific T.  Stress EKG nondiagnostic.  Symptoms: Dyspnea. 3.  Fixed defect likely due to breast attenuation in the inferoapical walls.  )       Anesthesia Quick Evaluation

## 2021-05-23 NOTE — Progress Notes (Signed)
Anesthesia Chart Review:  Patient follows with cardiologist Dr. Einar Gip for history of HTN, atypical chest pain.  She previously underwent evaluation for chest pain in October 2018.  Nuclear stress was low risk, nonischemic.  Echo showed EF 55 to 60%, moderate LVH, grade 2 diastolic dysfunction, mild MR.  Patient recently seen at [facility], ED 03/04/2021 for chest pain.  Work-up benign, felt to be noncardiac.  Per note, "Patient presents to the emergency department for evaluation of chest pain.  Patient experiencing pain in the center of her chest that goes into her back. Pain began at rest.  Patient has very reproducible chest wall pain. She did not have any improvement after nitroglycerin. Pain also is reproduced with any movement of the torso.  Symptoms appear to be musculoskeletal in nature.  EKG without evidence of ischemia. 2 troponins are negative.  Patient's labs are unremarkable other than CKD.  No mediastinal widening on x-ray, presentation does not fit with aortic dissection.  As pain is felt to be musculoskeletal in nature, will discharge with analgesia, prompt follow-up with primary care and her cardiologist, Dr. Einar Gip.  Return for worsening symptoms."  Patient is subsequently follow-up with her PCP Dr. Loistine Simas on 03/15/2021.  He denied chest pain at that time.  She was prescribed nitroglycerin to be used as needed for chest pain.  She recommended to make a follow-up appointment with Dr. Einar Gip.  She has not yet done this.  Reported history of OSA on CPAP, reportedly no longer using CPAP after weight loss.  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  Reviewed history with Dr. Ermalene Postin.  He advised patient okay to proceed as planned as long as she remains asymptomatic from cardiovascular standpoint.  She will be evaluated on day of surgery by assigned anesthesiologist.  EKG 03/04/21: Sinus rhythm.  Rate 66.  Probable LAE.  Nonspecific IVCD with LAD.  TTE 10/15/2017 (copy in chart): Left ventricle cavity is normal in  size.  Moderate concentric hypertrophy of the left ventricle.  Normal global wall motion.  Elevated LAP.  Grade 2 diastolic dysfunction.  Mildly increased LVOT velocity likely due to LVH.  No S.A.M. or subaortic stenosis.  LVEF 55 to 60%.  LVH and diastolic dysfunction increased in severity compared to prior echocardiogram in 2016. Left atrial cavity is mildly dilated. Mild aortic valve leaflet thickening.  Normal aortic valve leaflet mobility.  Trace aortic valve stenosis. Mild (grade 1) mitral regurgitation. Pulmonary artery systolic pressures estimated 25 to 30 mmHg.  Nuclear stress 09/28/2017 (copy on chart): 1.  Low risk of hemodynamically significant coronary artery disease.  Prognostically, this is a low risk study. 2.  Resting EKG IRBBB, nonspecific T.  Stress EKG nondiagnostic.  Symptoms: Dyspnea. 3.  Fixed defect likely due to breast attenuation in the inferoapical walls.    Wynonia Musty [facility] Short Stay Center/Anesthesiology Phone [phone] 05/23/2021 4:26 PM

## 2021-05-24 ENCOUNTER — Ambulatory Visit (HOSPITAL_COMMUNITY)
Admission: RE | Admit: 2021-05-24 | Discharge: 2021-05-24 | Disposition: A | Discharge status: Home / Self Care | Payer: Medicare Other | Source: Ambulatory Visit | Attending: Vascular Surgery | Admitting: Vascular Surgery

## 2021-05-24 ENCOUNTER — Encounter (HOSPITAL_COMMUNITY): Payer: Self-pay | Admitting: Vascular Surgery

## 2021-05-24 ENCOUNTER — Ambulatory Visit (HOSPITAL_COMMUNITY): Payer: Medicare Other | Admitting: Physician Assistant

## 2021-05-24 ENCOUNTER — Encounter (HOSPITAL_COMMUNITY)
Admission: RE | Disposition: A | Discharge status: Home / Self Care | Payer: Self-pay | Source: Ambulatory Visit | Attending: Vascular Surgery

## 2021-05-24 ENCOUNTER — Other Ambulatory Visit: Payer: Self-pay

## 2021-05-24 DIAGNOSIS — Z880 Allergy status to penicillin: Secondary | ICD-10-CM | POA: Diagnosis not present

## 2021-05-24 DIAGNOSIS — Z7984 Long term (current) use of oral hypoglycemic drugs: Secondary | ICD-10-CM | POA: Diagnosis not present

## 2021-05-24 DIAGNOSIS — Z9884 Bariatric surgery status: Secondary | ICD-10-CM | POA: Insufficient documentation

## 2021-05-24 DIAGNOSIS — Z833 Family history of diabetes mellitus: Secondary | ICD-10-CM | POA: Insufficient documentation

## 2021-05-24 DIAGNOSIS — Z8719 Personal history of other diseases of the digestive system: Secondary | ICD-10-CM | POA: Diagnosis not present

## 2021-05-24 DIAGNOSIS — E1122 Type 2 diabetes mellitus with diabetic chronic kidney disease: Secondary | ICD-10-CM | POA: Diagnosis not present

## 2021-05-24 DIAGNOSIS — G4730 Sleep apnea, unspecified: Secondary | ICD-10-CM | POA: Diagnosis not present

## 2021-05-24 DIAGNOSIS — I120 Hypertensive chronic kidney disease with stage 5 chronic kidney disease or end stage renal disease: Secondary | ICD-10-CM | POA: Insufficient documentation

## 2021-05-24 DIAGNOSIS — Z8249 Family history of ischemic heart disease and other diseases of the circulatory system: Secondary | ICD-10-CM | POA: Diagnosis not present

## 2021-05-24 DIAGNOSIS — N185 Chronic kidney disease, stage 5: Secondary | ICD-10-CM | POA: Diagnosis present

## 2021-05-24 DIAGNOSIS — Z79899 Other long term (current) drug therapy: Secondary | ICD-10-CM | POA: Diagnosis not present

## 2021-05-24 HISTORY — DX: Other complications of anesthesia, initial encounter: T88.59XA

## 2021-05-24 HISTORY — DX: Personal history of urinary calculi: Z87.442

## 2021-05-24 HISTORY — PX: AV FISTULA PLACEMENT: SHX1204

## 2021-05-24 HISTORY — DX: Prediabetes: R73.03

## 2021-05-24 LAB — POCT I-STAT, CHEM 8
BUN: 54 mg/dL — ABNORMAL HIGH (ref 8–23)
Calcium, Ion: 1 mmol/L — ABNORMAL LOW (ref 1.15–1.40)
Chloride: 111 mmol/L (ref 98–111)
Creatinine, Ser: 4 mg/dL — ABNORMAL HIGH (ref 0.44–1.00)
Glucose, Bld: 123 mg/dL — ABNORMAL HIGH (ref 70–99)
HCT: 40 % (ref 36.0–46.0)
Hemoglobin: 13.6 g/dL (ref 12.0–15.0)
Potassium: 3.7 mmol/L (ref 3.5–5.1)
Sodium: 143 mmol/L (ref 135–145)
TCO2: 20 mmol/L — ABNORMAL LOW (ref 22–32)

## 2021-05-24 SURGERY — ARTERIOVENOUS (AV) FISTULA CREATION
Anesthesia: Monitor Anesthesia Care | Site: Arm Lower | Laterality: Left

## 2021-05-24 MED ORDER — MIDAZOLAM HCL 2 MG/2ML IJ SOLN
INTRAMUSCULAR | Status: DC | PRN
  Administered 2021-05-24: 1 mg via INTRAVENOUS

## 2021-05-24 MED ORDER — HYDROCODONE-ACETAMINOPHEN 5-325 MG PO TABS: 1.0000 | ORAL_TABLET | Freq: Four times a day (QID) | ORAL | 0 refills | Status: DC | PRN

## 2021-05-24 MED ORDER — LIDOCAINE HCL (PF) 1 % IJ SOLN
INTRAMUSCULAR | Status: AC
  Filled 2021-05-24: qty 30

## 2021-05-24 MED ORDER — ORAL CARE MOUTH RINSE: 15.0000 mL | Freq: Once | OROMUCOSAL | Status: AC

## 2021-05-24 MED ORDER — FENTANYL CITRATE (PF) 100 MCG/2ML IJ SOLN: 25.0000 ug | INTRAMUSCULAR | Status: DC | PRN

## 2021-05-24 MED ORDER — PROPOFOL 10 MG/ML IV BOLUS
INTRAVENOUS | Status: DC | PRN
  Administered 2021-05-24: 20 mg via INTRAVENOUS
  Administered 2021-05-24: 10 mg via INTRAVENOUS

## 2021-05-24 MED ORDER — SODIUM CHLORIDE 0.9 % IV SOLN: INTRAVENOUS | Status: DC | PRN

## 2021-05-24 MED ORDER — OXYCODONE HCL 5 MG/5ML PO SOLN: 5.0000 mg | Freq: Once | ORAL | Status: DC | PRN

## 2021-05-24 MED ORDER — VANCOMYCIN HCL 1000 MG IV SOLR
INTRAVENOUS | Status: DC | PRN
  Administered 2021-05-24: 1000 mg via INTRAVENOUS

## 2021-05-24 MED ORDER — 0.9 % SODIUM CHLORIDE (POUR BTL) OPTIME
TOPICAL | Status: DC | PRN
  Administered 2021-05-24: 1000 mL

## 2021-05-24 MED ORDER — CHLORHEXIDINE GLUCONATE 4 % EX LIQD: 60.0000 mL | Freq: Once | CUTANEOUS | Status: DC

## 2021-05-24 MED ORDER — ACETAMINOPHEN 10 MG/ML IV SOLN: 1000.0000 mg | Freq: Once | INTRAVENOUS | Status: DC | PRN

## 2021-05-24 MED ORDER — FENTANYL CITRATE (PF) 250 MCG/5ML IJ SOLN
INTRAMUSCULAR | Status: AC
  Filled 2021-05-24: qty 5

## 2021-05-24 MED ORDER — VANCOMYCIN HCL IN DEXTROSE 1-5 GM/200ML-% IV SOLN
1000.0000 mg | INTRAVENOUS | Status: AC
  Administered 2021-05-24: 1000 mg via INTRAVENOUS
  Filled 2021-05-24: qty 200

## 2021-05-24 MED ORDER — PROPOFOL 500 MG/50ML IV EMUL
INTRAVENOUS | Status: DC | PRN
  Administered 2021-05-24: 25 ug/kg/min via INTRAVENOUS

## 2021-05-24 MED ORDER — FENTANYL CITRATE (PF) 250 MCG/5ML IJ SOLN
INTRAMUSCULAR | Status: DC | PRN
  Administered 2021-05-24: 50 ug via INTRAVENOUS

## 2021-05-24 MED ORDER — HEPARIN SODIUM (PORCINE) 1000 UNIT/ML IJ SOLN
INTRAMUSCULAR | Status: DC | PRN
  Administered 2021-05-24: 3000 [IU] via INTRAVENOUS

## 2021-05-24 MED ORDER — ACETAMINOPHEN 500 MG PO TABS: 1000.0000 mg | ORAL_TABLET | Freq: Once | ORAL | Status: DC | PRN

## 2021-05-24 MED ORDER — SODIUM CHLORIDE 0.9 % IV SOLN: INTRAVENOUS | Status: DC

## 2021-05-24 MED ORDER — LIDOCAINE-EPINEPHRINE 1 %-1:100000 IJ SOLN
INTRAMUSCULAR | Status: AC
  Filled 2021-05-24: qty 1

## 2021-05-24 MED ORDER — MIDAZOLAM HCL 2 MG/2ML IJ SOLN
INTRAMUSCULAR | Status: AC
  Filled 2021-05-24: qty 2

## 2021-05-24 MED ORDER — CHLORHEXIDINE GLUCONATE 0.12 % MT SOLN
15.0000 mL | Freq: Once | OROMUCOSAL | Status: AC
  Administered 2021-05-24: 15 mL via OROMUCOSAL
  Filled 2021-05-24: qty 15

## 2021-05-24 MED ORDER — ACETAMINOPHEN 160 MG/5ML PO SOLN: 1000.0000 mg | Freq: Once | ORAL | Status: DC | PRN

## 2021-05-24 MED ORDER — SODIUM CHLORIDE 0.9 % IV SOLN
INTRAVENOUS | Status: AC
  Filled 2021-05-24: qty 1.2

## 2021-05-24 MED ORDER — OXYCODONE HCL 5 MG PO TABS: 5.0000 mg | ORAL_TABLET | Freq: Once | ORAL | Status: DC | PRN

## 2021-05-24 SURGICAL SUPPLY — 35 items
ARMBAND PINK RESTRICT EXTREMIT (MISCELLANEOUS) ×2 IMPLANT
BLADE CLIPPER SURG (BLADE) IMPLANT
CANISTER SUCT 3000ML PPV (MISCELLANEOUS) ×2 IMPLANT
CLIP VESOCCLUDE MED 6/CT (CLIP) ×2 IMPLANT
CLIP VESOCCLUDE SM WIDE 6/CT (CLIP) ×4 IMPLANT
COVER PROBE W GEL 5X96 (DRAPES) ×2 IMPLANT
COVER WAND RF STERILE (DRAPES) IMPLANT
DECANTER SPIKE VIAL GLASS SM (MISCELLANEOUS) ×2 IMPLANT
DERMABOND ADVANCED (GAUZE/BANDAGES/DRESSINGS) ×1
DERMABOND ADVANCED .7 DNX12 (GAUZE/BANDAGES/DRESSINGS) ×1 IMPLANT
ELECT REM PT RETURN 9FT ADLT (ELECTROSURGICAL) ×2
ELECTRODE REM PT RTRN 9FT ADLT (ELECTROSURGICAL) ×1 IMPLANT
GLOVE BIO SURGEON STRL SZ7.5 (GLOVE) ×2 IMPLANT
GLOVE SRG 8 PF TXTR STRL LF DI (GLOVE) ×1 IMPLANT
GLOVE SURG MICRO LTX SZ6.5 (GLOVE) ×2 IMPLANT
GLOVE SURG MICRO LTX SZ7 (GLOVE) ×2 IMPLANT
GLOVE SURG UNDER POLY LF SZ8 (GLOVE) ×1
GOWN STRL REUS W/ TWL LRG LVL3 (GOWN DISPOSABLE) ×2 IMPLANT
GOWN STRL REUS W/ TWL XL LVL3 (GOWN DISPOSABLE) ×2 IMPLANT
GOWN STRL REUS W/TWL LRG LVL3 (GOWN DISPOSABLE) ×2
GOWN STRL REUS W/TWL XL LVL3 (GOWN DISPOSABLE) ×2
HEMOSTAT SPONGE AVITENE ULTRA (HEMOSTASIS) IMPLANT
KIT BASIN OR (CUSTOM PROCEDURE TRAY) ×2 IMPLANT
KIT TURNOVER KIT B (KITS) ×2 IMPLANT
NS IRRIG 1000ML POUR BTL (IV SOLUTION) ×2 IMPLANT
PACK CV ACCESS (CUSTOM PROCEDURE TRAY) ×2 IMPLANT
PAD ARMBOARD 7.5X6 YLW CONV (MISCELLANEOUS) ×4 IMPLANT
SUT MNCRL AB 4-0 PS2 18 (SUTURE) ×2 IMPLANT
SUT PROLENE 6 0 BV (SUTURE) ×8 IMPLANT
SUT PROLENE 7 0 BV 1 (SUTURE) IMPLANT
SUT VIC AB 3-0 SH 27 (SUTURE) ×1
SUT VIC AB 3-0 SH 27X BRD (SUTURE) ×1 IMPLANT
TOWEL GREEN STERILE (TOWEL DISPOSABLE) ×2 IMPLANT
UNDERPAD 30X36 HEAVY ABSORB (UNDERPADS AND DIAPERS) ×2 IMPLANT
WATER STERILE IRR 1000ML POUR (IV SOLUTION) ×2 IMPLANT

## 2021-05-24 NOTE — H&P (Signed)
History and Physical Interval Note:  05/24/2021 7:52 AM  Colleen Gray  has presented today for surgery, with the diagnosis of ESRD.  The various methods of treatment have been discussed with the patient and family. After consideration of risks, benefits and other options for treatment, the patient has consented to  Procedure(s): LEFT ARM ARTERIOVENOUS (AV) FISTULA CREATION (Left) as a surgical intervention.  The patient's history has been reviewed, patient examined, no change in status, stable for surgery.  I have reviewed the patient's chart and labs.  Questions were answered to the patient's satisfaction.    Discussed plan for left arm AV fistula.  Patient previously seen in the office and we had discussed placing her fistula in the right arm given she is left-handed.  She now wants the fistula in the left arm and the left arm is marked.  She recently had shoulder surgery and she has some mobility problems with her left arm and feels it would be better to have a fistula in the left arm since this arm is already restricted with range of motion.  Risk benefits again discussed.  Marty Heck  Patient name: Colleen Gray       MRN: [id number]        DOB: Oct 06, 1951        Sex: female  REASON FOR CONSULT: Evaluate new hemodialysis access  HPI: Colleen Gray is a 70 y.o. female, with history of hypertension and diabetes and stage V CKD who presents for evaluation of new dialysis access.  Patient states she is left-handed.  She has had no previous dialysis access in the past.  She has no chest wall implants.  She does walk with a cane.  Nephrology referral states place AVF now and wait on AVG.       Past Medical History:  Diagnosis Date  . Anemia   . Arthritis   . Asthma   . Bradycardia   . Chronic renal failure, stage 4 (severe) ([place]) 2017   no dialysis at this time  . Enlarged heart    Sees Dr. Einar Gip  . Heart murmur    due to rheumatic fever  . Hemorrhoid    . History of rheumatic fever    with a heart murmer  . Hypertension   . Ovarian cyst 1985   removed  . Pneumonia   . Sleep apnea    does not use Cpap (after weight loss no longer needed)         Past Surgical History:  Procedure Laterality Date  . BUNIONECTOMY WITH HAMMERTOE RECONSTRUCTION Bilateral 1980's; 1999; 2009    left; right; left 'w/plates and screws"  . COLONOSCOPY  2016   [facility] of colon cancer  . DILATION AND CURETTAGE OF UTERUS  early 1980's   for missed AB  . JOINT REPLACEMENT    . LAPAROSCOPIC GASTRIC BYPASS  2007  . LAPAROSCOPIC OVARIAN CYSTECTOMY Right 1985  . REVERSE SHOULDER ARTHROPLASTY Left 11/22/2015   Procedure: LEFT REVERSE SHOULDER ARTHROPLASTY;  Surgeon: Justice Britain, MD;  Location: [facility];  Service: Orthopedics;  Laterality: Left;  . ROTATOR CUFF REPAIR Right 2006         Family History  Problem Relation Age of Onset  . Colon cancer Mother 28  . Diabetes Mother   . Hypertension Mother   . Diabetes Father   . Hypertension Father   . Colon cancer Maternal Grandmother 96  . Diabetes Paternal Grandmother   . Diabetes Paternal Grandfather  SOCIAL HISTORY: Social History        Socioeconomic History  . Marital status: Widowed    Spouse name: Not on file  . Number of children: 0  . Years of education: Not on file  . Highest education level: Not on file  Occupational History  . Not on file  Tobacco Use  . Smoking status: Never Smoker  . Smokeless tobacco: Never Used  Substance and Sexual Activity  . Alcohol use: No    Alcohol/week: 0.0 standard drinks  . Drug use: No  . Sexual activity: Yes    Partners: Male    Birth control/protection: Post-menopausal  Other Topics Concern  . Not on file  Social History Narrative   Pt husband died in 12-Apr-2012 from esophageal rupture.  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   Social Determinants of Health   Financial Resource Strain: Not on file  Food Insecurity: Not on file  Transportation Needs: Not on file  Physical Activity: Not on file  Stress: Not on file  Social Connections: Not on file  Intimate Partner Violence: Not on file         Allergies  Allergen Reactions  . Nifedipine Swelling  . Penicillins Other (See Comments)    Skin rash  Skin rash   . Penicillin G Rash          Current Outpatient Medications  Medication Sig Dispense Refill  . albuterol (VENTOLIN HFA) 108 (90 Base) MCG/ACT inhaler Inhale into the lungs.    . Alpha-Lipoic Acid 300 MG CAPS Take by mouth.    Marland Kitchen amLODipine (NORVASC) 10 MG tablet     . calcitRIOL (ROCALTROL) 0.5 MCG capsule     . Calcium-Magnesium-Vitamin D (CALCIUM 1200+D3 PO) Take 1 tablet by mouth daily.    . Cobalamine Combinations (B-12) [phone] MCG SUBL Place 1 tablet under the tongue 3 (three) times a week.    . folic acid (FOLVITE) 1 MG tablet Take by mouth.    . Multiple Vitamin (MULTIVITAMIN WITH MINERALS) TABS tablet Take 1 tablet by mouth daily.    . nitroGLYCERIN (NITROSTAT) 0.4 MG SL tablet     . Omega 3-6-9 Fatty Acids (OMEGA-3-6-9 PO) Take 1 capsule by mouth daily.    . sevelamer carbonate (RENVELA) 800 MG tablet     . vitamin C (ASCORBIC ACID) 500 MG tablet Take 500 mg by mouth daily.    . Vitamin D, Ergocalciferol, (DRISDOL) 50000 units CAPS capsule Take 1 capsule by mouth once a week.  3  . Aspirin Buf,CaCarb-MgCarb-MgO, 81 MG TABS Take by mouth. (Patient not taking: Reported on 04/23/2021)    . azithromycin (ZITHROMAX) 250 MG tablet Take 1 tablet (250 mg total) by mouth daily. (Patient not taking: No sig reported) 6 tablet 0  . Blood Glucose Monitoring Suppl (GLUCOCOM BLOOD GLUCOSE MONITOR) DEVI Use daily to check blood sugar. (Patient not taking: Reported on 04/23/2021)    . Cyanocobalamin (VITAMIN B-12) 5000 MCG SUBL Place under the  tongue. (Patient not taking: Reported on 04/23/2021)    . glipiZIDE (GLUCOTROL XL) 2.5 MG 24 hr tablet TAKE 1 TABLET BY MOUTH EVERY DAY (Patient not taking: Reported on 04/23/2021)    . HYDROcodone-acetaminophen (NORCO/VICODIN) 5-325 MG tablet Take 1 tablet by mouth every 4 (four) hours as needed for moderate pain. (Patient not taking: Reported on 04/23/2021) 10 tablet 0  . HYDROcodone-acetaminophen (NORCO/VICODIN) 5-325  MG tablet TAKE 1 TABLET BY MOUTH EVERY FOUR HOURS AS NEEDED FOR MODERATE PAIN. (Patient not taking: Reported on 04/23/2021) 10 tablet 0  . labetalol (NORMODYNE) 100 MG tablet 200 mg 2 (two) times daily.    Marland Kitchen lisinopril (PRINIVIL,ZESTRIL) 20 MG tablet Take 20 mg by mouth daily. (Patient not taking: Reported on 04/23/2021)  0  . Misc Natural Products (SUPER GREENS PO) Take 2 capsules by mouth daily.    Marland Kitchen omega-3 acid ethyl esters (LOVAZA) 1 g capsule Take by mouth. (Patient not taking: Reported on 04/23/2021)    . torsemide (DEMADEX) 20 MG tablet TAKE 1 TABLET BY MOUTH EVERY DAY (Patient not taking: Reported on 04/23/2021)    . TURMERIC CURCUMIN PO Take 1 capsule by mouth daily. (Patient not taking: Reported on 04/23/2021)    . UNABLE TO FIND Take by mouth.    Marland Kitchen UNABLE TO FIND Use daily to check blood sugar.    Marland Kitchen UNABLE TO FIND Take by mouth.     No current facility-administered medications for this visit.    REVIEW OF SYSTEMS:  [X]  denotes positive finding, [ ]  denotes negative finding Cardiac  Comments:  Chest pain or chest pressure:    Shortness of breath upon exertion:    Short of breath when lying flat:    Irregular heart rhythm:        Vascular    Pain in calf, thigh, or hip brought on by ambulation: x   Pain in feet at night that wakes you up from your sleep:     Blood clot in your veins:    Leg swelling:         Pulmonary    Oxygen at home:    Productive cough:     Wheezing:         Neurologic    Sudden weakness  in arms or legs:     Sudden numbness in arms or legs:     Sudden onset of difficulty speaking or slurred speech:    Temporary loss of vision in one eye:     Problems with dizziness:         Gastrointestinal    Blood in stool:     Vomited blood:         Genitourinary    Burning when urinating:     Blood in urine:        Psychiatric    Major depression:         Hematologic    Bleeding problems:    Problems with blood clotting too easily:        Skin    Rashes or ulcers:        Constitutional    Fever or chills:      PHYSICAL EXAM:    Vitals:   04/23/21 1205  BP: (!) 145/69  Pulse: (!) 51  Resp: 16  Temp: 98 F (36.7 C)  TempSrc: Temporal  SpO2: 97%  Weight: 273 lb (123.8 kg)  Height: 5\' 3"  (1.6 m)    GENERAL: The patient is a well-nourished female, in no acute distress. The vital signs are documented above. CARDIAC: There is a regular rate and rhythm.  VASCULAR:  Palpable radial brachial pulses bilateral upper extremities No chest wall implants PULMONARY: No respiratory distress. ABDOMEN: Soft and non-tender. MUSCULOSKELETAL: There are no major deformities or cyanosis. NEUROLOGIC: No focal weakness or paresthesias are detected. SKIN: There are no ulcers or rashes noted. PSYCHIATRIC: The patient has a normal affect.  DATA:   Upper extremity arterial duplex shows triphasic waveforms of both upper extremities  Vein mapping shows usable cephalic and basilic veins in both upper extremities  Assessment/Plan:  70 year old female with stage V CKD that presents for new dialysis access evaluation.  I discussed with her in detail plan for placement in the nondominant arm which would be her right arm given she is left-handed.  She has a very nice cephalic and basilic vein in the right arm and we discussed potentially using the cephalic vein as our initial choice.  I discussed risk and benefits  including risk of steal, failure to mature, bleeding, infection etc.  We also discussed potential risk of needing to transpose the fistula in the future if it remains too deep to cannulate.  We will get her scheduled at her convenience in the near future.   Marty Heck, MD [facility] Office: [phone]

## 2021-05-24 NOTE — Discharge Instructions (Addendum)
° °  [facility] ° °Discharge Instructions ° °AV Fistula or Graft Surgery for Dialysis Access ° °Please refer to the following instructions for your post-procedure care. Your surgeon or physician assistant will discuss any changes with you. ° °Activity ° °You may drive the day following your surgery, if you are comfortable and no longer taking prescription pain medication. Resume full activity as the soreness in your incision resolves. ° °Bathing/Showering ° °You may shower after you go home. Keep your incision dry for 48 hours. Do not soak in a bathtub, hot tub, or swim until the incision heals completely. You may not shower if you have a hemodialysis catheter. ° °Incision Care ° °Clean your incision with mild soap and water after 48 hours. Pat the area dry with a clean towel. You do not need a bandage unless otherwise instructed. Do not apply any ointments or creams to your incision. You may have skin glue on your incision. Do not peel it off. It will come off on its own in about one week. Your arm may swell a bit after surgery. To reduce swelling use pillows to elevate your arm so it is above your heart. Your doctor will tell you if you need to lightly wrap your arm with an ACE bandage. ° °Diet ° °Resume your normal diet. There are not special food restrictions following this procedure. In order to heal from your surgery, it is CRITICAL to get adequate nutrition. Your body requires vitamins, minerals, and protein. Vegetables are the best source of vitamins and minerals. Vegetables also provide the perfect balance of protein. Processed food has little nutritional value, so try to avoid this. ° °Medications ° °Resume taking all of your medications. If your incision is causing pain, you may take over-the counter pain relievers such as acetaminophen (Tylenol). If you were prescribed a stronger pain medication, please be aware these medications can cause nausea and constipation. Prevent  nausea by taking the medication with a snack or meal. Avoid constipation by drinking plenty of fluids and eating foods with high amount of fiber, such as fruits, vegetables, and grains. Do not take Tylenol if you are taking prescription pain medications. ° ° ° ° °Follow up °Your surgeon may want to see you in the office following your access surgery. If so, this will be arranged at the time of your surgery. ° °Please call us immediately for any of the following conditions: ° °Increased pain, redness, drainage (pus) from your incision site °Fever of 101 degrees or higher °Severe or worsening pain at your incision site °Hand pain or numbness. ° °Reduce your risk of vascular disease: ° °Stop smoking. If you would like help, call [facility] at 1-800-QUIT-NOW (1-800-784-8669) or [facility] at 336-586-4000 ° °Manage your cholesterol °Maintain a desired weight °Control your diabetes °Keep your blood pressure down ° °Dialysis ° °It will take several weeks to several months for your new dialysis access to be ready for use. Your surgeon will determine when it is OK to use it. Your nephrologist will continue to direct your dialysis. You can continue to use your Permcath until your new access is ready for use. ° °If you have any questions, please call the office at [phone]. ° °

## 2021-05-24 NOTE — Anesthesia Procedure Notes (Signed)
Procedure Name: MAC Date/Time: 05/24/2021 8:11 AM Performed by: Amadeo Garnet, CRNA Pre-anesthesia Checklist: Patient identified, Emergency Drugs available, Suction available and Patient being monitored Patient Re-evaluated:Patient Re-evaluated prior to induction Oxygen Delivery Method: Simple face mask Preoxygenation: Pre-oxygenation with 100% oxygen Induction Type: IV induction Placement Confirmation: positive ETCO2 Dental Injury: Teeth and Oropharynx as per pre-operative assessment

## 2021-05-24 NOTE — Transfer of Care (Addendum)
Immediate Anesthesia Transfer of Care Note  Patient: Colleen Gray  Procedure(s) Performed: LEFT ARM BRACHIOCEPHALIC ARTERIOVENOUS (AV) FISTULA CREATION (Left Arm Lower)  Patient Location: PACU  Anesthesia Type:MAC combined with regional for post-op pain  Level of Consciousness: awake, alert  and oriented  Airway & Oxygen Therapy: Patient Spontanous Breathing  Post-op Assessment: Report given to RN, Post -op Vital signs reviewed and stable and Patient moving all extremities  Post vital signs: Reviewed and stable  Last Vitals:  Vitals Value Taken Time  BP 135/67 05/24/21 0932  Temp 36.3 C 05/24/21 0932  Pulse 72 05/24/21 0944  Resp 22 05/24/21 0944  SpO2 100 % 05/24/21 0944  Vitals shown include unvalidated device data.  Last Pain:  Vitals:   05/24/21 0932  TempSrc:   PainSc: 0-No pain         Complications: No complications documented.

## 2021-05-24 NOTE — Op Note (Signed)
OPERATIVE NOTE   PROCEDURE: left brachiocephalic arteriovenous fistula placement  PRE-OPERATIVE DIAGNOSIS: chronic kidney disease stage 5  POST-OPERATIVE DIAGNOSIS: same as above   SURGEON: Marty Heck, MD  ASSISTANT(S): Delena Serve, RNFA  ANESTHESIA: regional  ESTIMATED BLOOD LOSS: Minimal  FINDING(S): 1.  Cephalic vein: 4-5 mm, acceptable 2.  Brachial artery: 4 mm, disease free 3.  Venous outflow: palpable thrill  4.  Radial flow: palpable radial pulse  SPECIMEN(S):  none  INDICATIONS:   Colleen Gray is a 70 y.o. female who presents with chronic kidney disease stage 5 and need for permanent hemodialysis access.  The patient is scheduled for left arm arteriovenous fistula placement.  The patient is aware the risks include but are not limited to: bleeding, infection, steal syndrome, nerve damage, ischemic monomelic neuropathy, failure to mature, and need for additional procedures.  The patient is aware of the risks of the procedure and elects to proceed forward.   DESCRIPTION: After full informed written consent was obtained from the patient, the patient was brought back to the operating room and placed supine upon the operating table.  Prior to induction, the patient received IV antibiotics.   After obtaining adequate anesthesia, the patient was then prepped and draped in the standard fashion for a left arm access procedure.  I turned my attention first to identifying the patient's cephalic vein and brachial artery.  The cephalic vein looked to be of good caliber in the proximal forearm.  Using SonoSite guidance, the location of these vessels were marked out on the skin.     I made a transverse incision at the level of the antecubitum and dissected through the subcutaneous tissue and fascia to gain exposure of the brachial artery.  This was noted to be 4 mm in diameter externally.  This was dissected out proximally and distally and controlled with vessel  loops .  I then dissected out the cephalic vein.  This was noted to be 4-5 mm in diameter externally.  The distal segment of the vein was ligated with a  2-0 silk, and the vein was transected.  The proximal segment was interrogated with serial dilators.  The vein accepted up to a 5 mm dilator without any difficulty.  I then instilled the heparinized saline into the vein and clamped it.  At this point, I reset my exposure of the brachial artery.  The patient was given 3000 units IV heparin.  I then placed the artery under tension proximally and distally.  I made an arteriotomy with a #11 blade, and then I extended the arteriotomy with a Potts scissor.  I injected heparinized saline proximal and distal to this arteriotomy.  The vein was then sewn to the artery in an end-to-side configuration with a running stitch of 6-0 Prolene.  Prior to completing this anastomosis, I allowed the vein and artery to backbleed.  There was no evidence of clot from any vessels.  I completed the anastomosis in the usual fashion and then released all vessel loops and clamps.    There was a palpable thrill in the venous outflow, and there was a palpable radial pulse.  At this point, I irrigated out the surgical wound.  There was no further active bleeding.  The subcutaneous tissue was reapproximated with a running stitch of 3-0 Vicryl.  The skin was then reapproximated with a running subcuticular stitch of 4-0 Monocryl.  The skin was then cleaned, dried, and reinforced with Dermabond.  The patient tolerated this  procedure well.   COMPLICATIONS: None  CONDITION: Stable  Marty Heck, MD [facility] Office: [place]   05/24/2021, 9:22 AM

## 2021-05-27 ENCOUNTER — Encounter (HOSPITAL_COMMUNITY): Payer: Self-pay | Admitting: Vascular Surgery

## 2021-05-27 NOTE — Anesthesia Postprocedure Evaluation (Signed)
Anesthesia Post Note  Patient: Edmund Hilda  Procedure(s) Performed: LEFT ARM BRACHIOCEPHALIC ARTERIOVENOUS (AV) FISTULA CREATION (Left Arm Lower)     Patient location during evaluation: PACU Anesthesia Type: Regional and MAC Level of consciousness: awake and alert Pain management: pain level controlled Vital Signs Assessment: post-procedure vital signs reviewed and stable Respiratory status: spontaneous breathing, nonlabored ventilation, respiratory function stable and patient connected to nasal cannula oxygen Cardiovascular status: stable and blood pressure returned to baseline Postop Assessment: no apparent nausea or vomiting Anesthetic complications: no   No complications documented.  Last Vitals:  Vitals:   05/24/21 0947 05/24/21 1002  BP: (!) 151/69 (!) 148/80  Pulse: 69 87  Resp: 20 19  Temp:  36.5 C  SpO2: 100% 96%    Last Pain:  Vitals:   05/24/21 1002  TempSrc:   PainSc: 0-No pain                 Tannah Dreyfuss

## 2021-05-31 ENCOUNTER — Other Ambulatory Visit: Payer: Self-pay | Admitting: *Deleted

## 2021-05-31 DIAGNOSIS — N185 Chronic kidney disease, stage 5: Secondary | ICD-10-CM

## 2021-07-03 ENCOUNTER — Other Ambulatory Visit: Payer: Self-pay

## 2021-07-03 ENCOUNTER — Ambulatory Visit
Admission: RE | Admit: 2021-07-03 | Discharge: 2021-07-03 | Disposition: A | Discharge status: Home / Self Care | Payer: Medicare Other | Source: Ambulatory Visit | Attending: Internal Medicine | Admitting: Internal Medicine

## 2021-07-03 DIAGNOSIS — Z1231 Encounter for screening mammogram for malignant neoplasm of breast: Secondary | ICD-10-CM

## 2021-07-08 ENCOUNTER — Other Ambulatory Visit: Payer: Self-pay

## 2021-07-08 ENCOUNTER — Ambulatory Visit (INDEPENDENT_AMBULATORY_CARE_PROVIDER_SITE_OTHER): Payer: Medicare Other | Admitting: Physician Assistant

## 2021-07-08 ENCOUNTER — Ambulatory Visit (HOSPITAL_COMMUNITY)
Admission: RE | Admit: 2021-07-08 | Discharge: 2021-07-08 | Disposition: A | Discharge status: Home / Self Care | Payer: Medicare Other | Source: Ambulatory Visit | Attending: Vascular Surgery | Admitting: Vascular Surgery

## 2021-07-08 VITALS — BP 144/68 | HR 55 | Temp 98.0°F | Resp 20 | Ht 63.0 in | Wt 279.5 lb

## 2021-07-08 DIAGNOSIS — N185 Chronic kidney disease, stage 5: Secondary | ICD-10-CM

## 2021-07-08 NOTE — Progress Notes (Signed)
    Postoperative Access Visit   History of Present Illness   Colleen Gray is a 70 y.o. year old female who presents for postoperative follow-up for:  left brachiocephalic arteriovenous fistula placement 05/24/21 by Dr. Carlis Abbott.   The patient's wounds are healed.  The patient notes no steal symptoms.  The patient is  able to complete their activities of daily living.  She is not yet on hemodialysis. She has follow up with her Nephrologist Dr. Marval Regal on 7/26  Physical Examination   Vitals:   07/08/21 1429  BP: (!) 144/68  Pulse: (!) 55  Resp: 20  Temp: 98 F (36.7 C)  TempSrc: Temporal  SpO2: 99%  Weight: 279 lb 8 oz (126.8 kg)  Height: 5\' 3"  (1.6 m)   Body mass index is 49.51 kg/m.  left arm Incision is well healed,2+ radial pulse, hand grip is 5/5, sensation in digits is intact, palpable thrill, bruit can  be auscultated. Fistula is deep in left upper arm    Medical Decision Making   Colleen Gray is a 70 y.o. year old female who presents s/p left brachiocephalic arteriovenous fistula placement 05/24/21 by Dr. Carlis Abbott. Fistula has matured nicely however it is deep in the left upper arm clinically. Duplex shows that it is of adequate depth but it is hard to palpate. She is not currently on HD and is not sure when she will need to start. I discussed recommendation with her for a transposition/ elevation of her fistula. She would like to follow up with Dr. Marval Regal prior to proceeding. She will contact our office if she decides in near future to schedule her surgery otherwise I have arranaged for a 2 month follow up   Colleen Caldwell, PA-C [facility] [place]: [phone]  On call MD: Oneida Alar

## 2021-09-10 ENCOUNTER — Ambulatory Visit: Payer: Medicare Other

## 2021-09-17 ENCOUNTER — Ambulatory Visit (INDEPENDENT_AMBULATORY_CARE_PROVIDER_SITE_OTHER): Payer: Medicare Other | Admitting: Vascular Surgery

## 2021-09-17 ENCOUNTER — Other Ambulatory Visit: Payer: Self-pay

## 2021-09-17 ENCOUNTER — Encounter: Payer: Self-pay | Admitting: Vascular Surgery

## 2021-09-17 VITALS — BP 155/81 | HR 57 | Temp 98.2°F | Resp 16 | Ht 63.0 in | Wt 270.0 lb

## 2021-09-17 DIAGNOSIS — N185 Chronic kidney disease, stage 5: Secondary | ICD-10-CM | POA: Diagnosis not present

## 2021-09-17 NOTE — Progress Notes (Signed)
Patient name: Colleen Gray MRN: [id number] DOB: 06/25/1951 Sex: female  REASON FOR CONSULT: Follow-up for left arm AV fistula and evaluate for superficialization  HPI: Colleen Gray is a 70 y.o. female, with history of hypertension and diabetes and stage V CKD that presents for follow-up of left arm AV fistula.  She underwent a left brachiocephalic on 03/25/346 by me.  She was seen by the PA in follow-up who recommended superficialization.  She wanted to talk to Dr. Marval Regal prior to any further surgery.  She saw him yesterday and he has concerns about being able to access the fistula given he feels it is deep on exam and she is morbidly obese.  She has not started dialysis at this time.  Past Medical History:  Diagnosis Date   Anemia    Arthritis    Asthma    Bradycardia    Chronic renal failure, stage 4 (severe) ([place]) 2017   no dialysis at this time, Stage 5 (as of 03/23/58)   Complication of anesthesia    during anesthesia heart rate is low    Enlarged heart    Sees Dr. Einar Gip   Heart murmur    due to rheumatic fever   Hemorrhoid    History of kidney stones    History of rheumatic fever    with a heart murmer   Hypertension    Ovarian cyst 1985   removed   Pneumonia    Pre-diabetes    Sleep apnea    does not use Cpap (after weight loss no longer needed)    Past Surgical History:  Procedure Laterality Date   AV FISTULA PLACEMENT Left 05/24/2021   Procedure: LEFT ARM BRACHIOCEPHALIC ARTERIOVENOUS (AV) FISTULA CREATION;  Surgeon: Marty Heck, MD;  Location: [place];  Service: Vascular;  Laterality: Left;   BUNIONECTOMY WITH HAMMERTOE RECONSTRUCTION Bilateral 1980's; 1999; 2009    left; right; left 'w/plates and screws"   COLONOSCOPY  2016   [facility] of colon cancer   DILATION AND CURETTAGE OF UTERUS  early 1980's   for missed AB   JOINT REPLACEMENT     LAPAROSCOPIC GASTRIC BYPASS  2007   LAPAROSCOPIC OVARIAN CYSTECTOMY Right 1985   REVERSE SHOULDER  ARTHROPLASTY Left 11/22/2015   Procedure: LEFT REVERSE SHOULDER ARTHROPLASTY;  Surgeon: Justice Britain, MD;  Location: [place];  Service: Orthopedics;  Laterality: Left;   ROTATOR CUFF REPAIR Right 2006    Family History  Problem Relation Age of Onset   Colon cancer Mother 63   Diabetes Mother    Hypertension Mother    Diabetes Father    Hypertension Father    Colon cancer Maternal Grandmother 96   Diabetes Paternal Grandmother    Diabetes Paternal Grandfather    Breast cancer Neg Hx     SOCIAL HISTORY: Social History   Socioeconomic History   Marital status: Widowed    Spouse name: Not on file   Number of children: 0   Years of education: Not on file   Highest education level: Not on file  Occupational History   Not on file  Tobacco Use   Smoking status: Never   Smokeless tobacco: Never  Substance and Sexual Activity   Alcohol use: No    Alcohol/week: 0.0 standard drinks   Drug use: No   Sexual activity: Yes    Partners: Male    Birth control/protection: Post-menopausal  Other Topics Concern   Not on file  Social History Narrative   Pt  husband died in 2013 from esophageal rupture.  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   Social Determinants of Health   Financial Resource Strain: Not on file  Food Insecurity: Not on file  Transportation Needs: Not on file  Physical Activity: Not on file  Stress: Not on file  Social Connections: Not on file  Intimate Partner Violence: Not on file    Allergies  Allergen Reactions   Nifedipine Swelling   Penicillins Rash    Current Outpatient Medications  Medication Sig Dispense Refill   albuterol (VENTOLIN HFA) 108 (90 Base) MCG/ACT inhaler Inhale 1-2 puffs into the lungs every 6 (six) hours as needed for shortness of breath or wheezing.     Alpha-Lipoic Acid 300 MG CAPS Take 300 mg by mouth daily.     amLODipine  (NORVASC) 10 MG tablet Take 10 mg by mouth daily.     calcitRIOL (ROCALTROL) 0.5 MCG capsule Take 0.5 mcg by mouth daily.     calcium elemental as carbonate (BARIATRIC TUMS ULTRA) 400 MG chewable tablet Chew 1,000 mg by mouth 3 (three) times daily.     Cholecalciferol (VITAMIN D3) 250 MCG (10000 UT) capsule Take 10,000 Units by mouth daily.     ferrous sulfate 325 (65 FE) MG tablet Take 325 mg by mouth daily with breakfast.     folic acid (FOLVITE) 1 MG tablet Take 1 mg by mouth daily.     labetalol (NORMODYNE) 200 MG tablet Take 200 mg by mouth 2 (two) times daily.     Multiple Vitamin (MULTIVITAMIN WITH MINERALS) TABS tablet Take 1 tablet by mouth daily.     nitroGLYCERIN (NITROSTAT) 0.4 MG SL tablet Place 0.4 mg under the tongue every 5 (five) minutes as needed for chest pain.     Omega 3-6-9 Fatty Acids (OMEGA-3-6-9 PO) Take 1 capsule by mouth daily.     sevelamer carbonate (RENVELA) 800 MG tablet Take 800 mg by mouth 3 (three) times daily with meals.     vitamin B-12 (CYANOCOBALAMIN) 1000 MCG tablet Take 1,000 mcg by mouth every Monday, Wednesday, and Friday.     vitamin C (ASCORBIC ACID) 500 MG tablet Take 500 mg by mouth daily.     azithromycin (ZITHROMAX) 250 MG tablet Take 1 tablet (250 mg total) by mouth daily. 6 tablet 0   Blood Glucose Monitoring Suppl (GLUCOCOM BLOOD GLUCOSE MONITOR) DEVI Use daily to check blood sugar. (Patient not taking: No sig reported)     HYDROcodone-acetaminophen (NORCO/VICODIN) 5-325 MG tablet Take 1 tablet by mouth every 6 (six) hours as needed for moderate pain. 10 tablet 0   No current facility-administered medications for this visit.    REVIEW OF SYSTEMS:  [X]  denotes positive finding, [ ]  denotes negative finding Cardiac  Comments:  Chest pain or chest pressure:    Shortness of breath upon exertion:    Short of breath when lying flat:    Irregular heart rhythm:        Vascular    Pain in calf, thigh, or hip brought on by ambulation:    Pain in  feet at night that wakes you up from your sleep:     Blood clot in your veins:    Leg swelling:         Pulmonary    Oxygen at home:    Productive cough:     Wheezing:  Neurologic    Sudden weakness in arms or legs:     Sudden numbness in arms or legs:     Sudden onset of difficulty speaking or slurred speech:    Temporary loss of vision in one eye:     Problems with dizziness:         Gastrointestinal    Blood in stool:     Vomited blood:         Genitourinary    Burning when urinating:     Blood in urine:        Psychiatric    Major depression:         Hematologic    Bleeding problems:    Problems with blood clotting too easily:        Skin    Rashes or ulcers:        Constitutional    Fever or chills:      PHYSICAL EXAM: Vitals:   09/17/21 1034  BP: (!) 155/81  Pulse: (!) 57  Resp: 16  Temp: 98.2 F (36.8 C)  TempSrc: Temporal  SpO2: 97%  Weight: 270 lb (122.5 kg)  Height: 5\' 3"  (1.6 m)    GENERAL: The patient is a well-nourished female, in no acute distress. The vital signs are documented above. CARDIAC: There is a regular rate and rhythm.  VASCULAR:  Left brachiocephalic AV fistula has excellent thrill This is deep in the mid to proximal upper arm  DATA:   Patient Name:  Monya ROSE Nuss  Date of Exam:   07/08/2021  Medical Rec #: [id number]          Accession #:    [id number]  Date of Birth: [birth year]         Patient Gender: F  Patient Age:   69Y  Exam Location:  [facility]  Procedure:      VAS US DUPLEX DIALYSIS ACCESS (AVF, AVG)  Referring Phys: [id number] Marty Heck    ---------------------------------------------------------------------------  -----     Reason for Exam: Routine follow up.   Access Site: Left Upper Extremity.   Access Type: Brachial-cephalic AVF.   History: 05/24/21 AVF surgery.   Performing Technologist: June Leap RDMS, RVT      Examination Guidelines: A complete evaluation  includes B-mode imaging,  spectral  Doppler, color Doppler, and power Doppler as needed of all accessible  portions  of each vessel. Unilateral testing is considered an integral part of a  complete  examination. Limited examinations for reoccurring indications may be  performed  as noted.      Findings:  +--------------------+----------+-----------------+--------+  AVF                 PSV (cm/s)Flow Vol (mL/min)Comments  +--------------------+----------+-----------------+--------+  Native artery inflow   246          1183                 +--------------------+----------+-----------------+--------+  AVF Anastomosis        367                               +--------------------+----------+-----------------+--------+      +------------+----------+-------------+----------+----------------+  OUTFLOW VEINPSV (cm/s)Diameter (cm)Depth (cm)    Describe      +------------+----------+-------------+----------+----------------+  Prox UA        212        0.62        1.42                     +------------+----------+-------------+----------+----------------+  Mid UA         257        0.55        0.53   tortuous, branch  +------------+----------+-------------+----------+----------------+  Dist UA        188        0.56        0.54                     +------------+----------+-------------+----------+----------------+  AC Fossa       295        0.62        0.60                     +------------+----------+-------------+----------+----------------+        Summary:  Patent arteriovenous fistula.     Assessment/Plan:  70 year old female status post left brachiocephalic AV fistula for stage V CKD.  I agree with Dr. Arty Baumgartner and his concern about being able to access the fistula given she is morbidly obese and the fistula is somewhat difficult to feel on exam especially in the mid to proximal upper arm.  I have recommended left arm AV fistula  revision with superficialization.  We will get her scheduled today.  Risk and benefits discussed.   Marty Heck, MD [facility] [place] Office: [phone]

## 2021-09-25 ENCOUNTER — Encounter (HOSPITAL_COMMUNITY): Payer: Self-pay | Admitting: Vascular Surgery

## 2021-09-26 ENCOUNTER — Other Ambulatory Visit: Payer: Self-pay

## 2021-09-26 ENCOUNTER — Encounter (HOSPITAL_COMMUNITY): Payer: Self-pay | Admitting: Vascular Surgery

## 2021-09-26 NOTE — Progress Notes (Signed)
Anesthesia Chart Review: Colleen Gray  Case: [id number] Date/Time: 09/27/21 0951   Procedure: LEFT ARM ARTERIOVENOUS FISTULA REVISION AND SUPERFICIALIZATION (Left)   Anesthesia type: Choice   Pre-op diagnosis: CKD 5   Location: [facility] OR ROOM 11 / [place] OR   Surgeons: Marty Heck, MD       DISCUSSION: Patient is a 70 year old female scheduled for the above procedure.  She is s/p left brachiocephalic AVF creation 12/29/82.  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  Chart previously reviewed with anesthesiologist prior to June AVF creation after patient had ED visit 03/04/21 for chest pain, felt to be non-cardiac with reproducible chest wall pain. ED recommended follow-up with her PCP and cardiologist. She has not see Dr. Virgina Jock since 2018. She has had ongoing follow-up with her PCP. She went on have LUE AVF creation on 05/24/21.    Anesthesia team to evaluate on the day of surgery.   VS:  BP Readings from Last 3 Encounters:  09/17/21 (!) 155/81  07/08/21 (!) 144/68  05/24/21 (!) 148/80   Pulse Readings from Last 3 Encounters:  09/17/21 (!) 57  07/08/21 (!) 55  05/24/21 87     PROVIDERS: Concepcion Elk, MD is PCP. Last visit 07/22/21 ([facility]). - She is not currently followed by a cardiologist, but had evaluation with Jackquline Berlin, MD [facility]) in 2018 for HTN, atypical chest pain.  She underwent evaluation for chest pain in October 2018.  Nuclear stress was low risk, nonischemic.  Echo showed EF 55 to 60%, moderate LVH, grade 2 diastolic dysfunction, mild MR.  Donato Heinz, MD is nephrologist    LABS: For ISTAT8 on arrival. A1c 5.8%, H/H 11.8/36.5 on 07/16/21 ([facility]). Cr 4.00 on  05/24/21.   IMAGES: CXR 03/04/21: FINDINGS: Advanced arthritis right shoulder. Left shoulder replacement. Cardiomegaly. No consolidation pleural effusion or pneumothorax. Aortic atherosclerosis. IMPRESSION: Cardiomegaly without edema or infiltrate.   EKG: 03/04/21: Sinus rhythm Probable left atrial enlargement Nonspecific IVCD with LAD Confirmed by Orpah Greek [phone]) on 03/04/2021 1:11:19 AM   CV: TTE 10/15/2017 [facility] CV, scanned under Media tab, Correspondence, Enc 05/24/21): Left ventricle cavity is normal in size.  Moderate concentric hypertrophy of the left ventricle.  Normal global wall motion.  Elevated LAP.  Grade 2 diastolic dysfunction.  Mildly increased LVOT velocity likely due to LVH.  No SAM or subaortic stenosis.  LVEF 55 to 60%.  LVH and diastolic dysfunction increased in severity compared to prior echocardiogram in 2016. Left atrial cavity is mildly dilated. Mild aortic valve leaflet thickening.  Normal aortic valve leaflet mobility.  Trace aortic valve stenosis. Mild (grade 1) mitral regurgitation. Pulmonary artery systolic pressures estimated 25 to 30 mmHg.    Nuclear stress 09/28/2017 [facility] CV, scanned under Media tab, Correspondence, Enc 05/24/21): 1.  Low risk of hemodynamically significant coronary artery disease.  Prognostically, this is a low risk study. 2.  Resting EKG IRBBB, nonspecific T.  Stress EKG nondiagnostic.  Symptoms: Dyspnea. 3.  Fixed defect likely due to breast attenuation in the inferoapical walls.    Past Medical History:  Diagnosis Date   Anemia    Arthritis    Asthma    Bradycardia    Chronic renal failure, stage 4 (severe) ([place]) 2017  no dialysis at this time, Stage 5 (as of 05/24/77)   Complication of anesthesia    during anesthesia heart rate is low    Enlarged heart    Sees Dr. Einar Gip   Heart murmur    due to rheumatic fever - Sees Dr Einar Gip   Hemorrhoid    History of kidney stones    History of rheumatic fever     with a heart murmer   Hypertension    Ovarian cyst 1985   removed   Pneumonia    Pre-diabetes    no meds   Sleep apnea    does not use Cpap (after weight loss no longer needed)    Past Surgical History:  Procedure Laterality Date   AV FISTULA PLACEMENT Left 05/24/2021   Procedure: LEFT ARM BRACHIOCEPHALIC ARTERIOVENOUS (AV) FISTULA CREATION;  Surgeon: Marty Heck, MD;  Location: [place];  Service: Vascular;  Laterality: Left;   BUNIONECTOMY WITH HAMMERTOE RECONSTRUCTION Bilateral 1980's; 1999; 2009    left; right; left 'w/plates and screws"   COLONOSCOPY  2016   [facility] of colon cancer   DILATION AND CURETTAGE OF UTERUS  early 1980's   for missed AB   JOINT REPLACEMENT     LAPAROSCOPIC GASTRIC BYPASS  2007   LAPAROSCOPIC OVARIAN CYSTECTOMY Right 1985   REVERSE SHOULDER ARTHROPLASTY Left 11/22/2015   Procedure: LEFT REVERSE SHOULDER ARTHROPLASTY;  Surgeon: Justice Britain, MD;  Location: [place];  Service: Orthopedics;  Laterality: Left;   ROTATOR CUFF REPAIR Right 2006    MEDICATIONS: No current facility-administered medications for this encounter.    albuterol (VENTOLIN HFA) 108 (90 Base) MCG/ACT inhaler   Alpha-Lipoic Acid 300 MG CAPS   amLODipine (NORVASC) 10 MG tablet   calcitRIOL (ROCALTROL) 0.5 MCG capsule   calcium elemental as carbonate (BARIATRIC TUMS ULTRA) 400 MG chewable tablet   Cholecalciferol (VITAMIN D3) 250 MCG (10000 UT) capsule   ferrous sulfate 325 (65 FE) MG tablet   folic acid (FOLVITE) 1 MG tablet   labetalol (NORMODYNE) 200 MG tablet   Multiple Vitamin (MULTIVITAMIN WITH MINERALS) TABS tablet   nitroGLYCERIN (NITROSTAT) 0.4 MG SL tablet   Omega 3-6-9 Fatty Acids (OMEGA-3-6-9 PO)   sevelamer carbonate (RENVELA) 800 MG tablet   vitamin B-12 (CYANOCOBALAMIN) 1000 MCG tablet   vitamin C (ASCORBIC ACID) 500 MG tablet   Blood Glucose Monitoring Suppl (GLUCOCOM BLOOD GLUCOSE MONITOR) DEVI    Myra Gianotti, PA-C Surgical Short Stay/Anesthesiology [facility]  Phone [phone] [facility] Phone [phone] 09/26/2021 11:54 AM

## 2021-09-26 NOTE — Progress Notes (Signed)
Spoke with pt for pre-op call. Pt has hx of bradycardia, HTN and Pre-diabetes. Pt's last A1C was 5.8 on 07/16/21. Pt is not on any medications.   Pt will be arriving and going home by [employer]. Her aunt, Elvera Bicker will be at home with her after surgery.   Pt will need any prescriptions that she may get after surgery to be called in to her pharmacy before she leaves to go home. Her aunt will pick them up before pt gets home.

## 2021-09-26 NOTE — Anesthesia Preprocedure Evaluation (Addendum)
Anesthesia Evaluation  Patient identified by MRN, date of birth, ID band Patient awake    Reviewed: Allergy & Precautions, NPO status , Patient's Chart, lab work & pertinent test results  History of Anesthesia Complications (+) history of anesthetic complications  Airway Mallampati: III  TM Distance: >3 FB Neck ROM: Full    Dental no notable dental hx. (+) Teeth Intact, Dental Advisory Given   Pulmonary asthma , sleep apnea ,    Pulmonary exam normal breath sounds clear to auscultation       Cardiovascular hypertension, Pt. on medications Normal cardiovascular exam Rhythm:Regular Rate:Normal  EKG: 03/04/21: Sinus rhythm Probable left atrial enlargement Nonspecific IVCD with LAD  TTE 10/15/2017  Left ventricle cavity is normal in size. Moderate concentric hypertrophy of the left ventricle. Normal global wall motion. Elevated LAP. Grade 2 diastolic dysfunction. Mildly increased LVOT velocity likely due to LVH. No SAM or subaortic stenosis. LVEF 55 to 60%. LVH and diastolic dysfunction increased in severity compared to prior echocardiogram in 2016. Left atrial cavity is mildly dilated. Mild aortic valve leaflet thickening. Normal aortic valve leaflet mobility. Trace aortic valve stenosis. Mild (grade 1) mitral regurgitation. Pulmonary artery systolic pressures estimated 25 to 30 mmHg.  Nuclear stress 09/28/2017 [facility] CV, scanned under Media tab, Correspondence, Enc 05/24/21): 1. Low risk of hemodynamically significant coronary artery disease. Prognostically, this is a low risk study. 2. Resting EKG IRBBB, nonspecific T. Stress EKG nondiagnostic. Symptoms: Dyspnea. 3. Fixed defect likely due to breast attenuation in the inferoapical walls.   Neuro/Psych  Neuromuscular disease negative psych ROS   GI/Hepatic negative GI ROS, Neg liver ROS,   Endo/Other  diabetes  Renal/GU CRF and ESRFRenal disease  negative  genitourinary   Musculoskeletal  (+) Arthritis ,   Abdominal   Peds negative pediatric ROS (+)  Hematology  (+) Blood dyscrasia, anemia ,   Anesthesia Other Findings   Reproductive/Obstetrics negative OB ROS                            Anesthesia Physical Anesthesia Plan  ASA: 4  Anesthesia Plan: MAC and Regional   Post-op Pain Management:  Regional for Post-op pain   Induction: Intravenous  PONV Risk Score and Plan: 2 and Ondansetron, Midazolam and Propofol infusion  Airway Management Planned: Mask, Natural Airway, Nasal Cannula and Simple Face Mask  Additional Equipment: None  Intra-op Plan:   Post-operative Plan:   Informed Consent:     Dental advisory given  Plan Discussed with: Anesthesiologist  Anesthesia Plan Comments: (PAT note written 09/26/2021 by Myra Gianotti, PA-C. Patient is a 70 year old female scheduled for the above procedure.  She is s/p left brachiocephalic AVF creation 0/8/67.  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  Chart previously reviewed with anesthesiologist prior to June AVF creation after patient had ED visit 03/04/21 for chest pain, felt to be non-cardiac with reproducible chest wall pain. ED recommended follow-up with her PCP and cardiologist. She has not see Dr. Virgina Jock since 2018. She has had ongoing follow-up with her PCP. She went on have LUE AVF creation on 05/24/21.  Colleen Elk, MD is PCP. Last visit 07/22/21 ([facility]).   She underwent evaluation for chest pain in October 2018. Nuclear  stress was low risk, nonischemic. Echo showed EF 55 to 60%, moderate LVH, grade 2 diastolic dysfunction, mild MR. )       Anesthesia Quick Evaluation

## 2021-09-27 ENCOUNTER — Ambulatory Visit (HOSPITAL_COMMUNITY): Payer: Medicare Other | Admitting: Vascular Surgery

## 2021-09-27 ENCOUNTER — Ambulatory Visit (HOSPITAL_COMMUNITY)
Admission: RE | Admit: 2021-09-27 | Discharge: 2021-09-27 | Disposition: A | Discharge status: Home / Self Care | Payer: Medicare Other | Source: Ambulatory Visit | Attending: Vascular Surgery | Admitting: Vascular Surgery

## 2021-09-27 ENCOUNTER — Encounter (HOSPITAL_COMMUNITY): Payer: Self-pay | Admitting: Vascular Surgery

## 2021-09-27 ENCOUNTER — Encounter (HOSPITAL_COMMUNITY)
Admission: RE | Disposition: A | Discharge status: Home / Self Care | Payer: Self-pay | Source: Ambulatory Visit | Attending: Vascular Surgery

## 2021-09-27 ENCOUNTER — Other Ambulatory Visit (HOSPITAL_COMMUNITY): Payer: Self-pay

## 2021-09-27 DIAGNOSIS — Z79899 Other long term (current) drug therapy: Secondary | ICD-10-CM | POA: Insufficient documentation

## 2021-09-27 DIAGNOSIS — I132 Hypertensive heart and chronic kidney disease with heart failure and with stage 5 chronic kidney disease, or end stage renal disease: Secondary | ICD-10-CM

## 2021-09-27 DIAGNOSIS — Z880 Allergy status to penicillin: Secondary | ICD-10-CM | POA: Insufficient documentation

## 2021-09-27 DIAGNOSIS — I120 Hypertensive chronic kidney disease with stage 5 chronic kidney disease or end stage renal disease: Secondary | ICD-10-CM | POA: Diagnosis present

## 2021-09-27 DIAGNOSIS — N186 End stage renal disease: Secondary | ICD-10-CM | POA: Diagnosis not present

## 2021-09-27 DIAGNOSIS — E1122 Type 2 diabetes mellitus with diabetic chronic kidney disease: Secondary | ICD-10-CM

## 2021-09-27 DIAGNOSIS — Z6841 Body Mass Index (BMI) 40.0 and over, adult: Secondary | ICD-10-CM | POA: Insufficient documentation

## 2021-09-27 DIAGNOSIS — Z833 Family history of diabetes mellitus: Secondary | ICD-10-CM | POA: Insufficient documentation

## 2021-09-27 DIAGNOSIS — Z992 Dependence on renal dialysis: Secondary | ICD-10-CM

## 2021-09-27 DIAGNOSIS — N185 Chronic kidney disease, stage 5: Secondary | ICD-10-CM | POA: Insufficient documentation

## 2021-09-27 HISTORY — PX: FISTULA SUPERFICIALIZATION: SHX6341

## 2021-09-27 LAB — POCT I-STAT, CHEM 8
BUN: 66 mg/dL — ABNORMAL HIGH (ref 8–23)
Calcium, Ion: 1.17 mmol/L (ref 1.15–1.40)
Chloride: 113 mmol/L — ABNORMAL HIGH (ref 98–111)
Creatinine, Ser: 4.2 mg/dL — ABNORMAL HIGH (ref 0.44–1.00)
Glucose, Bld: 120 mg/dL — ABNORMAL HIGH (ref 70–99)
HCT: 40 % (ref 36.0–46.0)
Hemoglobin: 13.6 g/dL (ref 12.0–15.0)
Potassium: 3.9 mmol/L (ref 3.5–5.1)
Sodium: 145 mmol/L (ref 135–145)
TCO2: 22 mmol/L (ref 22–32)

## 2021-09-27 LAB — GLUCOSE, CAPILLARY: Glucose-Capillary: 124 mg/dL — ABNORMAL HIGH (ref 70–99)

## 2021-09-27 SURGERY — FISTULA SUPERFICIALIZATION
Anesthesia: Monitor Anesthesia Care | Site: Arm Upper | Laterality: Left

## 2021-09-27 MED ORDER — PROPOFOL 500 MG/50ML IV EMUL
INTRAVENOUS | Status: DC | PRN
  Administered 2021-09-27: 75 ug/kg/min via INTRAVENOUS

## 2021-09-27 MED ORDER — FENTANYL CITRATE (PF) 100 MCG/2ML IJ SOLN: 25.0000 ug | INTRAMUSCULAR | Status: DC | PRN

## 2021-09-27 MED ORDER — HEPARIN 6000 UNIT IRRIGATION SOLUTION
Status: DC | PRN
  Administered 2021-09-27: 1

## 2021-09-27 MED ORDER — ORAL CARE MOUTH RINSE: 15.0000 mL | Freq: Once | OROMUCOSAL | Status: AC

## 2021-09-27 MED ORDER — MIDAZOLAM HCL 2 MG/2ML IJ SOLN: 2.0000 mg | Freq: Once | INTRAMUSCULAR | Status: AC

## 2021-09-27 MED ORDER — SODIUM CHLORIDE 0.9 % IV SOLN
INTRAVENOUS | Status: DC
Start: 2021-09-27 — End: 2021-09-27

## 2021-09-27 MED ORDER — OXYCODONE HCL 5 MG/5ML PO SOLN: 5.0000 mg | Freq: Once | ORAL | Status: DC | PRN

## 2021-09-27 MED ORDER — OXYCODONE HCL 5 MG PO TABS: 5.0000 mg | ORAL_TABLET | Freq: Once | ORAL | Status: DC | PRN

## 2021-09-27 MED ORDER — CHLORHEXIDINE GLUCONATE 0.12 % MT SOLN
15.0000 mL | Freq: Once | OROMUCOSAL | Status: AC
  Administered 2021-09-27: 15 mL via OROMUCOSAL
  Filled 2021-09-27: qty 15

## 2021-09-27 MED ORDER — MEPERIDINE HCL 25 MG/ML IJ SOLN: 6.2500 mg | INTRAMUSCULAR | Status: DC | PRN

## 2021-09-27 MED ORDER — PHENYLEPHRINE HCL-NACL 20-0.9 MG/250ML-% IV SOLN
INTRAVENOUS | Status: DC | PRN
Start: 2021-09-27 — End: 2021-09-27
  Administered 2021-09-27: 35 ug/min via INTRAVENOUS

## 2021-09-27 MED ORDER — KETAMINE HCL 50 MG/5ML IJ SOSY
PREFILLED_SYRINGE | INTRAMUSCULAR | Status: AC
  Filled 2021-09-27: qty 5

## 2021-09-27 MED ORDER — FENTANYL CITRATE (PF) 250 MCG/5ML IJ SOLN
INTRAMUSCULAR | Status: AC
  Filled 2021-09-27: qty 5

## 2021-09-27 MED ORDER — ACETAMINOPHEN 160 MG/5ML PO SOLN: 325.0000 mg | ORAL | Status: DC | PRN

## 2021-09-27 MED ORDER — FENTANYL CITRATE (PF) 100 MCG/2ML IJ SOLN
INTRAMUSCULAR | Status: AC
  Administered 2021-09-27: 50 ug via INTRAVENOUS
  Filled 2021-09-27: qty 2

## 2021-09-27 MED ORDER — MIDAZOLAM HCL 2 MG/2ML IJ SOLN
INTRAMUSCULAR | Status: AC
  Administered 2021-09-27: 2 mg via INTRAVENOUS
  Filled 2021-09-27: qty 2

## 2021-09-27 MED ORDER — LIDOCAINE 2% (20 MG/ML) 5 ML SYRINGE
INTRAMUSCULAR | Status: AC
  Filled 2021-09-27: qty 5

## 2021-09-27 MED ORDER — FENTANYL CITRATE (PF) 250 MCG/5ML IJ SOLN
INTRAMUSCULAR | Status: DC | PRN
  Administered 2021-09-27: 25 ug via INTRAVENOUS

## 2021-09-27 MED ORDER — MIDAZOLAM HCL 2 MG/2ML IJ SOLN
INTRAMUSCULAR | Status: AC
  Filled 2021-09-27: qty 2

## 2021-09-27 MED ORDER — PROPOFOL 1000 MG/100ML IV EMUL
INTRAVENOUS | Status: AC
  Filled 2021-09-27: qty 200

## 2021-09-27 MED ORDER — PHENYLEPHRINE HCL-NACL 20-0.9 MG/250ML-% IV SOLN
INTRAVENOUS | Status: AC
  Filled 2021-09-27: qty 250

## 2021-09-27 MED ORDER — VANCOMYCIN HCL 1500 MG/300ML IV SOLN
1500.0000 mg | INTRAVENOUS | Status: AC
  Administered 2021-09-27: 1500 mg via INTRAVENOUS
  Filled 2021-09-27: qty 300

## 2021-09-27 MED ORDER — ONDANSETRON HCL 4 MG/2ML IJ SOLN: 4.0000 mg | Freq: Once | INTRAMUSCULAR | Status: DC | PRN

## 2021-09-27 MED ORDER — OXYCODONE-ACETAMINOPHEN 5-325 MG PO TABS
1.0000 | ORAL_TABLET | ORAL | 0 refills | Status: DC | PRN
  Filled 2021-09-27: qty 20, 4d supply, fill #0

## 2021-09-27 MED ORDER — ROPIVACAINE HCL 5 MG/ML IJ SOLN
INTRAMUSCULAR | Status: DC | PRN
  Administered 2021-09-27: 20 mL via PERINEURAL

## 2021-09-27 MED ORDER — GLYCOPYRROLATE 0.2 MG/ML IJ SOLN
INTRAMUSCULAR | Status: DC | PRN
  Administered 2021-09-27: .2 mg via INTRAVENOUS

## 2021-09-27 MED ORDER — HEPARIN 6000 UNIT IRRIGATION SOLUTION
Status: AC
  Filled 2021-09-27: qty 500

## 2021-09-27 MED ORDER — 0.9 % SODIUM CHLORIDE (POUR BTL) OPTIME
TOPICAL | Status: DC | PRN
  Administered 2021-09-27: 1000 mL

## 2021-09-27 MED ORDER — PROPOFOL 10 MG/ML IV BOLUS
INTRAVENOUS | Status: AC
  Filled 2021-09-27: qty 20

## 2021-09-27 MED ORDER — MIDAZOLAM HCL 5 MG/5ML IJ SOLN
INTRAMUSCULAR | Status: DC | PRN
  Administered 2021-09-27 (×2): 1 mg via INTRAVENOUS

## 2021-09-27 MED ORDER — PROPOFOL 10 MG/ML IV BOLUS
INTRAVENOUS | Status: DC | PRN
  Administered 2021-09-27: 20 mg via INTRAVENOUS

## 2021-09-27 MED ORDER — ACETAMINOPHEN 325 MG PO TABS: 325.0000 mg | ORAL_TABLET | ORAL | Status: DC | PRN

## 2021-09-27 MED ORDER — CHLORHEXIDINE GLUCONATE 4 % EX LIQD: 60.0000 mL | Freq: Once | CUTANEOUS | Status: DC

## 2021-09-27 MED ORDER — LIDOCAINE HCL (PF) 1 % IJ SOLN
INTRAMUSCULAR | Status: AC
  Filled 2021-09-27: qty 30

## 2021-09-27 MED ORDER — FENTANYL CITRATE (PF) 100 MCG/2ML IJ SOLN
50.0000 ug | Freq: Once | INTRAMUSCULAR | Status: AC
Start: 2021-09-27 — End: 2021-09-27

## 2021-09-27 SURGICAL SUPPLY — 32 items
ARMBAND PINK RESTRICT EXTREMIT (MISCELLANEOUS) ×2 IMPLANT
BAG COUNTER SPONGE SURGICOUNT (BAG) ×2 IMPLANT
CANISTER SUCT 3000ML PPV (MISCELLANEOUS) ×2 IMPLANT
CLIP VESOCCLUDE MED 6/CT (CLIP) ×2 IMPLANT
CLIP VESOCCLUDE SM WIDE 6/CT (CLIP) ×2 IMPLANT
COVER PROBE W GEL 5X96 (DRAPES) ×2 IMPLANT
DECANTER SPIKE VIAL GLASS SM (MISCELLANEOUS) ×2 IMPLANT
DERMABOND ADVANCED (GAUZE/BANDAGES/DRESSINGS) ×1
DERMABOND ADVANCED .7 DNX12 (GAUZE/BANDAGES/DRESSINGS) ×1 IMPLANT
ELECT REM PT RETURN 9FT ADLT (ELECTROSURGICAL) ×2
ELECTRODE REM PT RTRN 9FT ADLT (ELECTROSURGICAL) ×1 IMPLANT
GLOVE SRG 8 PF TXTR STRL LF DI (GLOVE) ×1 IMPLANT
GLOVE SURG ENC MOIS LTX SZ7.5 (GLOVE) ×2 IMPLANT
GLOVE SURG UNDER POLY LF SZ8 (GLOVE) ×1
GOWN STRL REUS W/ TWL LRG LVL3 (GOWN DISPOSABLE) ×2 IMPLANT
GOWN STRL REUS W/ TWL XL LVL3 (GOWN DISPOSABLE) ×2 IMPLANT
GOWN STRL REUS W/TWL LRG LVL3 (GOWN DISPOSABLE) ×2
GOWN STRL REUS W/TWL XL LVL3 (GOWN DISPOSABLE) ×2
HEMOSTAT SPONGE AVITENE ULTRA (HEMOSTASIS) IMPLANT
KIT BASIN OR (CUSTOM PROCEDURE TRAY) ×2 IMPLANT
KIT TURNOVER KIT B (KITS) ×2 IMPLANT
NS IRRIG 1000ML POUR BTL (IV SOLUTION) ×2 IMPLANT
PACK CV ACCESS (CUSTOM PROCEDURE TRAY) ×2 IMPLANT
PAD ARMBOARD 7.5X6 YLW CONV (MISCELLANEOUS) ×4 IMPLANT
SUT MNCRL AB 4-0 PS2 18 (SUTURE) ×2 IMPLANT
SUT PROLENE 6 0 BV (SUTURE) ×2 IMPLANT
SUT PROLENE 7 0 BV 1 (SUTURE) ×2 IMPLANT
SUT VIC AB 3-0 SH 27 (SUTURE) ×2
SUT VIC AB 3-0 SH 27X BRD (SUTURE) ×2 IMPLANT
TOWEL GREEN STERILE (TOWEL DISPOSABLE) ×2 IMPLANT
UNDERPAD 30X36 HEAVY ABSORB (UNDERPADS AND DIAPERS) ×2 IMPLANT
WATER STERILE IRR 1000ML POUR (IV SOLUTION) ×2 IMPLANT

## 2021-09-27 NOTE — Op Note (Signed)
Date: September 27, 2021  Preoperative diagnosis: Too deep to cannulate left arm brachiocephalic AV fistula  Postoperative diagnosis: Same  Procedure: Revision of left arm AV fistula with superficialization and sidebranch ligation  Surgeon: Dr. Marty Heck, MD  Assistant: OR staff  Indications: Patient is a 70 year old female who is morbidly obese and underwent a left brachiocephalic AV fistula for stage V CKD earlier this year.  Her fistula remains too deep to cannulate and she presents today for left arm AV fistula revision with superficialization after risk benefits discussed.  Findings: Two skip incisions were made on the left upper arm and the cephalic vein was circummarginate mobilized and all side branches were ligated between 3-0 silk ties and divided.  I closed the subcutaneous tissue under the fistula with 3-0 Vicryl's to elevate the fistula in the subcutaneous tissue.  The skin was run closed directly over the fistula with 4-0 Monocryl.  Good thrill at completion.  Anesthesia: Regional block  Details: Patient was taken to the operating room after informed consent was obtained.  Placed on the operative table in supine position.  Anesthesia was induced.  The left arm was prepped and draped in usual sterile fashion.  Timeout was performed.  Antibiotics were given.  I used a sterile US probe to map out the course of the cephalic vein and this was marked on the skin over the left upper arm.  I then made two skip incisions over the fistula.  I dissected down with Bovie cautery and the fistula was circumferentially mobilized even between the skin tunnels and all side branches were ligated between 3-0 silk ties.  Ultimately elevated the fistula closing the subcutaneous tissue with 3-0 Vicryl's in interrupted fashion.  The skin was run closed directly over the fistula with 4-0 Monocryl and Dermabond.  Good thrill at completion.  Complication: None  Condition: Stable  Marty Heck, MD [facility] [place] Office: [place]

## 2021-09-27 NOTE — Anesthesia Procedure Notes (Signed)
Anesthesia Regional Block: Supraclavicular block   Pre-Anesthetic Checklist: , timeout performed,  Correct Patient, Correct Site, Correct Laterality,  Correct Procedure, Correct Position, site marked,  Risks and benefits discussed,  Surgical consent,  Pre-op evaluation,  At surgeon's request and post-op pain management  Laterality: Left  Prep: chloraprep       Needles:  Injection technique: Single-shot  Needle Type: Echogenic Stimulator Needle     Needle Length: 5cm  Needle Gauge: 22     Additional Needles:   Procedures:, nerve stimulator,,, ultrasound used (permanent image in chart),,     Nerve Stimulator or Paresthesia:  Response: hand, 0.45 mA  Additional Responses:   Narrative:  Start time: 09/27/2021 9:15 AM End time: 09/27/2021 9:20 AM Injection made incrementally with aspirations every 5 mL.  Performed by: Personally  Anesthesiologist: Janeece Riggers, MD  Additional Notes: Functioning IV was confirmed and monitors were applied.  A 19mm 22ga Arrow echogenic stimulator needle was used. Sterile prep and drape,hand hygiene and sterile gloves were used. Ultrasound guidance: relevant anatomy identified, needle position confirmed, local anesthetic spread visualized around nerve(s)., vascular puncture avoided.  Image printed for medical record. Negative aspiration and negative test dose prior to incremental administration of local anesthetic. The patient tolerated the procedure well.

## 2021-09-27 NOTE — H&P (Signed)
History and Physical Interval Note:  09/27/2021 9:37 AM  Colleen Gray  has presented today for surgery, with the diagnosis of CKD 5.  The various methods of treatment have been discussed with the patient and family. After consideration of risks, benefits and other options for treatment, the patient has consented to  Procedure(s): LEFT ARM ARTERIOVENOUS FISTULA REVISION AND SUPERFICIALIZATION (Left) as a surgical intervention.  The patient's history has been reviewed, patient examined, no change in status, stable for surgery.  I have reviewed the patient's chart and labs.  Questions were answered to the patient's satisfaction.    Left arm AV fistula revision with superficialization.  Marty Heck  Patient name: Colleen Gray       MRN: [id number]        DOB: December 02, 1951        Sex: female   REASON FOR CONSULT: Follow-up for left arm AV fistula and evaluate for superficialization   HPI: Colleen Gray is a 70 y.o. female, with history of hypertension and diabetes and stage V CKD that presents for follow-up of left arm AV fistula.  She underwent a left brachiocephalic on 06/26/7340 by me.  She was seen by the PA in follow-up who recommended superficialization.  She wanted to talk to Dr. Marval Regal prior to any further surgery.  She saw him yesterday and he has concerns about being able to access the fistula given he feels it is deep on exam and she is morbidly obese.  She has not started dialysis at this time.       Past Medical History:  Diagnosis Date   Anemia     Arthritis     Asthma     Bradycardia     Chronic renal failure, stage 4 (severe) ([place]) 2017    no dialysis at this time, Stage 5 (as of 08/24/78)   Complication of anesthesia      during anesthesia heart rate is low    Enlarged heart      Sees Dr. Einar Gip   Heart murmur      due to rheumatic fever   Hemorrhoid     History of kidney stones     History of rheumatic fever      with a heart murmer   Hypertension      Ovarian cyst 1985    removed   Pneumonia     Pre-diabetes     Sleep apnea      does not use Cpap (after weight loss no longer needed)           Past Surgical History:  Procedure Laterality Date   AV FISTULA PLACEMENT Left 05/24/2021    Procedure: LEFT ARM BRACHIOCEPHALIC ARTERIOVENOUS (AV) FISTULA CREATION;  Surgeon: Marty Heck, MD;  Location: [place];  Service: Vascular;  Laterality: Left;   BUNIONECTOMY WITH HAMMERTOE RECONSTRUCTION Bilateral 1980's; 1999; 2009     left; right; left 'w/plates and screws"   COLONOSCOPY   2016    [facility] of colon cancer   DILATION AND CURETTAGE OF UTERUS   early 1980's    for missed AB   JOINT REPLACEMENT       LAPAROSCOPIC GASTRIC BYPASS   2007   LAPAROSCOPIC OVARIAN CYSTECTOMY Right 1985   REVERSE SHOULDER ARTHROPLASTY Left 11/22/2015    Procedure: LEFT REVERSE SHOULDER ARTHROPLASTY;  Surgeon: Justice Britain, MD;  Location: [place];  Service: Orthopedics;  Laterality: Left;   ROTATOR CUFF REPAIR Right 2006  Family History  Problem Relation Age of Onset   Colon cancer Mother 61   Diabetes Mother     Hypertension Mother     Diabetes Father     Hypertension Father     Colon cancer Maternal Grandmother 96   Diabetes Paternal Grandmother     Diabetes Paternal Grandfather     Breast cancer Neg Hx        SOCIAL HISTORY: Social History         Socioeconomic History   Marital status: Widowed      Spouse name: Not on file   Number of children: 0   Years of education: Not on file   Highest education level: Not on file  Occupational History   Not on file  Tobacco Use   Smoking status: Never   Smokeless tobacco: Never  Substance and Sexual Activity   Alcohol use: No      Alcohol/week: 0.0 standard drinks   Drug use: No   Sexual activity: Yes      Partners: Male      Birth control/protection: Post-menopausal  Other Topics Concern   Not on file  Social History Narrative    Pt husband died in 2012-04-11 from esophageal rupture.   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    Social Determinants of Health    Financial Resource Strain: Not on file  Food Insecurity: Not on file  Transportation Needs: Not on file  Physical Activity: Not on file  Stress: Not on file  Social Connections: Not on file  Intimate Partner Violence: Not on file          Allergies  Allergen Reactions   Nifedipine Swelling   Penicillins Rash            Current Outpatient Medications  Medication Sig Dispense Refill   albuterol (VENTOLIN HFA) 108 (90 Base) MCG/ACT inhaler Inhale 1-2 puffs into the lungs every 6 (six) hours as needed for shortness of breath or wheezing.       Alpha-Lipoic Acid 300 MG CAPS Take 300 mg by mouth daily.       amLODipine (NORVASC) 10 MG tablet Take 10 mg by mouth daily.       calcitRIOL (ROCALTROL) 0.5 MCG capsule Take 0.5 mcg by mouth daily.       calcium elemental as carbonate (BARIATRIC TUMS ULTRA) 400 MG chewable tablet Chew 1,000 mg by mouth 3 (three) times daily.       Cholecalciferol (VITAMIN D3) 250 MCG (10000 UT) capsule Take 10,000 Units by mouth daily.       ferrous sulfate 325 (65 FE) MG tablet Take 325 mg by mouth daily with breakfast.       folic acid (FOLVITE) 1 MG tablet Take 1 mg by mouth daily.       labetalol (NORMODYNE) 200 MG tablet Take 200 mg by mouth 2 (two) times daily.       Multiple Vitamin (MULTIVITAMIN WITH MINERALS) TABS tablet Take 1 tablet by mouth daily.       nitroGLYCERIN (NITROSTAT) 0.4 MG SL tablet Place 0.4 mg under the tongue every 5 (five) minutes as needed for chest pain.       Omega 3-6-9 Fatty Acids (OMEGA-3-6-9 PO) Take 1 capsule by mouth daily.       sevelamer carbonate (RENVELA) 800 MG tablet Take 800 mg by mouth 3 (three) times daily  with meals.       vitamin B-12 (CYANOCOBALAMIN) 1000 MCG tablet Take 1,000 mcg by mouth every Monday, Wednesday, and Friday.        vitamin C (ASCORBIC ACID) 500 MG tablet Take 500 mg by mouth daily.       azithromycin (ZITHROMAX) 250 MG tablet Take 1 tablet (250 mg total) by mouth daily. 6 tablet 0   Blood Glucose Monitoring Suppl (GLUCOCOM BLOOD GLUCOSE MONITOR) DEVI Use daily to check blood sugar. (Patient not taking: No sig reported)       HYDROcodone-acetaminophen (NORCO/VICODIN) 5-325 MG tablet Take 1 tablet by mouth every 6 (six) hours as needed for moderate pain. 10 tablet 0    No current facility-administered medications for this visit.      REVIEW OF SYSTEMS:  [X]  denotes positive finding, [ ]  denotes negative finding Cardiac   Comments:  Chest pain or chest pressure:      Shortness of breath upon exertion:      Short of breath when lying flat:      Irregular heart rhythm:             Vascular      Pain in calf, thigh, or hip brought on by ambulation:      Pain in feet at night that wakes you up from your sleep:       Blood clot in your veins:      Leg swelling:              Pulmonary      Oxygen at home:      Productive cough:       Wheezing:              Neurologic      Sudden weakness in arms or legs:       Sudden numbness in arms or legs:       Sudden onset of difficulty speaking or slurred speech:      Temporary loss of vision in one eye:       Problems with dizziness:              Gastrointestinal      Blood in stool:       Vomited blood:              Genitourinary      Burning when urinating:       Blood in urine:             Psychiatric      Major depression:              Hematologic      Bleeding problems:      Problems with blood clotting too easily:             Skin      Rashes or ulcers:             Constitutional      Fever or chills:          PHYSICAL EXAM:    Vitals:    09/17/21 1034  BP: (!) 155/81  Pulse: (!) 57  Resp: 16  Temp: 98.2 F (36.8 C)  TempSrc: Temporal  SpO2: 97%  Weight: 270 lb (122.5 kg)  Height: 5\' 3"  (1.6 m)      GENERAL: The patient  is a well-nourished female, in no acute distress. The vital signs are documented above. CARDIAC: There is a regular rate and rhythm.  VASCULAR:  Left brachiocephalic AV fistula has excellent thrill This is deep in the mid to proximal upper arm   DATA:    Patient Name:  Rubylee ROSE Mccaskill  Date of Exam:   07/08/2021  Medical Rec #: [id number]          Accession #:    [id number]  Date of Birth: [birth year]         Patient Gender: F  Patient Age:   69Y  Exam Location:  [facility]  Procedure:      VAS US DUPLEX DIALYSIS ACCESS (AVF, AVG)  Referring Phys: [id number] Marty Heck    ---------------------------------------------------------------------------  -----     Reason for Exam: Routine follow up.   Access Site: Left Upper Extremity.   Access Type: Brachial-cephalic AVF.   History: 05/24/21 AVF surgery.   Performing Technologist: June Leap RDMS, RVT      Examination Guidelines: A complete evaluation includes B-mode imaging,  spectral  Doppler, color Doppler, and power Doppler as needed of all accessible  portions  of each vessel. Unilateral testing is considered an integral part of a  complete  examination. Limited examinations for reoccurring indications may be  performed  as noted.      Findings:  +--------------------+----------+-----------------+--------+  AVF                 PSV (cm/s)Flow Vol (mL/min)Comments  +--------------------+----------+-----------------+--------+  Native artery inflow   246          1183                 +--------------------+----------+-----------------+--------+  AVF Anastomosis        367                               +--------------------+----------+-----------------+--------+      +------------+----------+-------------+----------+----------------+  OUTFLOW VEINPSV (cm/s)Diameter (cm)Depth (cm)    Describe      +------------+----------+-------------+----------+----------------+  Prox UA         212        0.62        1.42                     +------------+----------+-------------+----------+----------------+  Mid UA         257        0.55        0.53   tortuous, branch  +------------+----------+-------------+----------+----------------+  Dist UA        188        0.56        0.54                     +------------+----------+-------------+----------+----------------+  AC Fossa       295        0.62        0.60                     +------------+----------+-------------+----------+----------------+        Summary:  Patent arteriovenous fistula.       Assessment/Plan:   70 year old female status post left brachiocephalic AV fistula for stage V CKD.  I agree with Dr. Arty Baumgartner and his concern about being able to access the fistula given she is morbidly obese and the fistula is somewhat difficult to feel on exam especially in the mid to proximal upper arm.  I have recommended left arm AV fistula revision with superficialization.  We will get her scheduled today.  Risk and benefits discussed.     Marty Heck, MD [facility] [place] Office: [phone]

## 2021-09-27 NOTE — Progress Notes (Signed)
Orthopedic Tech Progress Note Patient Details:  Colleen Gray [birth year] [id number]  Ortho Devices Type of Ortho Device: Arm sling Ortho Device/Splint Interventions: Ordered      Colleen Gray Colleen Gray 09/27/2021, 12:28 PM

## 2021-09-27 NOTE — Anesthesia Postprocedure Evaluation (Signed)
Anesthesia Post Note  Patient: Edmund Hilda  Procedure(s) Performed: LEFT ARM ARTERIOVENOUS FISTULA REVISION AND SUPERFICIALIZATION (Left: Arm Upper)     Patient location during evaluation: PACU Anesthesia Type: Regional and MAC Level of consciousness: awake and alert Pain management: pain level controlled Vital Signs Assessment: post-procedure vital signs reviewed and stable Respiratory status: spontaneous breathing, nonlabored ventilation, respiratory function stable and patient connected to nasal cannula oxygen Cardiovascular status: stable and blood pressure returned to baseline Postop Assessment: no apparent nausea or vomiting Anesthetic complications: no   No notable events documented.  Last Vitals:  Vitals:   09/27/21 1130 09/27/21 1215  BP: (!) 113/58 132/73  Pulse: (!) 58 (!) 56  Resp: 12   Temp: (!) 36.1 C (!) 36.3 C  SpO2: 100% 100%    Last Pain:  Vitals:   09/27/21 1215  TempSrc:   PainSc: 0-No pain                 Tylene Quashie

## 2021-09-27 NOTE — Transfer of Care (Signed)
Immediate Anesthesia Transfer of Care Note  Patient: Colleen Gray  Procedure(s) Performed: LEFT ARM ARTERIOVENOUS FISTULA REVISION AND SUPERFICIALIZATION (Left: Arm Upper)  Patient Location: PACU  Anesthesia Type:MAC  Level of Consciousness: awake  Airway & Oxygen Therapy: Patient Spontanous Breathing  Post-op Assessment: Report given to RN  Post vital signs: stable  Last Vitals:  Vitals Value Taken Time  BP 113/58 09/27/21 1133  Temp    Pulse 57 09/27/21 1134  Resp 12 09/27/21 1134  SpO2 100 % 09/27/21 1134  Vitals shown include unvalidated device data.  Last Pain:  Vitals:   09/27/21 0831  TempSrc:   PainSc: 8       Patients Stated Pain Goal: 3 (01/41/03 0131)  Complications: No notable events documented.

## 2021-09-27 NOTE — Discharge Instructions (Signed)
[facility]  Discharge Instructions  AV Fistula or Graft Surgery for Dialysis Access  Please refer to the following instructions for your post-procedure care. Your surgeon or physician assistant will discuss any changes with you.  Activity  You may drive the day following your surgery, if you are comfortable and no longer taking prescription pain medication. Resume full activity as the soreness in your incision resolves.  Bathing/Showering  You may shower after you go home. Keep your incision dry for 48 hours. Do not soak in a bathtub, hot tub, or swim until the incision heals completely. You may not shower if you have a hemodialysis catheter.  Incision Care  Clean your incision with mild soap and water after 48 hours. Pat the area dry with a clean towel. You do not need a bandage unless otherwise instructed. Do not apply any ointments or creams to your incision. You may have skin glue on your incision. Do not peel it off. It will come off on its own in about one week. Your arm may swell a bit after surgery. To reduce swelling use pillows to elevate your arm so it is above your heart. Your doctor will tell you if you need to lightly wrap your arm with an ACE bandage.  Diet  Resume your normal diet. There are not special food restrictions following this procedure. In order to heal from your surgery, it is CRITICAL to get adequate nutrition. Your body requires vitamins, minerals, and protein. Vegetables are the best source of vitamins and minerals. Vegetables also provide the perfect balance of protein. Processed food has little nutritional value, so try to avoid this.  Medications  Resume taking all of your medications. If your incision is causing pain, you may take over-the counter pain relievers such as acetaminophen (Tylenol). If you were prescribed a stronger pain medication, please be aware these medications can cause nausea and constipation. Prevent  nausea by taking the medication with a snack or meal. Avoid constipation by drinking plenty of fluids and eating foods with high amount of fiber, such as fruits, vegetables, and grains.  Do not take Tylenol if you are taking prescription pain medications.  Follow up Your surgeon may want to see you in the office following your access surgery. If so, this will be arranged at the time of your surgery.  Please call us immediately for any of the following conditions:  Increased pain, redness, drainage (pus) from your incision site Fever of 101 degrees or higher Severe or worsening pain at your incision site Hand pain or numbness.  Reduce your risk of vascular disease:  Stop smoking. If you would like help, call [facility] at 1-800-QUIT-NOW 608-169-4229) or [place] at [place] your cholesterol Maintain a desired weight Control your diabetes Keep your blood pressure down  Dialysis  It will take several weeks to several months for your new dialysis access to be ready for use. Your surgeon will determine when it is okay to use it. Your nephrologist will continue to direct your dialysis. You can continue to use your Permcath until your new access is ready for use.   09/27/2021 Colleen Gray Colleen Gray [id number] [birth year]  Surgeon(s): Marty Heck, MD  Procedure(s): LEFT ARM ARTERIOVENOUS FISTULA REVISION AND SUPERFICIALIZATION   May stick graft immediately   May stick graft on designated area only:   X Do not stick left AV Fistula for 6 weeks    If you have any questions, please call the  office at [phone].

## 2021-09-28 ENCOUNTER — Encounter (HOSPITAL_COMMUNITY): Payer: Self-pay | Admitting: Vascular Surgery

## 2021-09-30 ENCOUNTER — Telehealth: Payer: Self-pay

## 2021-09-30 NOTE — Telephone Encounter (Signed)
Patient is s/p left arm AVF revision on 09/27/21. She calls today to report that her arm bled from Friday to Sunday but has now stopped bleeding. Says the upper portion of her arm is maroon colored and she has developed multiple blisters up and down her arm - says some are clear and some are filled with blood. Her arm is swollen, uncomfortable and itchy. Says she is having difficulty elevating it above the level of her heart. Placed patient on the schedule for a wound check tomorrow.

## 2021-10-01 ENCOUNTER — Other Ambulatory Visit: Payer: Self-pay

## 2021-10-01 ENCOUNTER — Ambulatory Visit (INDEPENDENT_AMBULATORY_CARE_PROVIDER_SITE_OTHER): Payer: Medicare Other | Admitting: Physician Assistant

## 2021-10-01 VITALS — BP 121/59 | HR 62 | Temp 98.0°F | Resp 20 | Ht 63.0 in | Wt 270.0 lb

## 2021-10-01 DIAGNOSIS — S40022A Contusion of left upper arm, initial encounter: Secondary | ICD-10-CM

## 2021-10-01 DIAGNOSIS — N185 Chronic kidney disease, stage 5: Secondary | ICD-10-CM

## 2021-10-01 MED ORDER — CLINDAMYCIN HCL 300 MG PO CAPS: 300.0000 mg | ORAL_CAPSULE | Freq: Three times a day (TID) | ORAL | 0 refills | Status: DC

## 2021-10-01 NOTE — Progress Notes (Signed)
POST OPERATIVE DIALYSIS ACCESS OFFICE NOTE    CC:  F/u for dialysis access surgery  HPI:  This is a 70 y.o. female who is s/p Revision of left arm AV fistula with superficialization and sidebranch ligation by Dr. Carlis Abbott one week ago. She presents with skin blistering and swelling. 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 Denies hand pain, numbness, fever. She is on no blood thinning medication.   Allergies  Allergen Reactions   Nifedipine Swelling   Penicillins Rash    Current Outpatient Medications  Medication Sig Dispense Refill   albuterol (VENTOLIN HFA) 108 (90 Base) MCG/ACT inhaler Inhale 1-2 puffs into the lungs every 6 (six) hours as needed for shortness of breath or wheezing.     Alpha-Lipoic Acid 300 MG CAPS Take 300 mg by mouth daily.     amLODipine (NORVASC) 10 MG tablet Take 10 mg by mouth daily.     Blood Glucose Monitoring Suppl (GLUCOCOM BLOOD GLUCOSE MONITOR) DEVI Use daily to check blood sugar.     calcitRIOL (ROCALTROL) 0.5 MCG capsule Take 0.5 mcg by mouth daily.     calcium elemental as carbonate (BARIATRIC TUMS ULTRA) 400 MG chewable tablet Chew 1,000 mg by mouth 3 (three) times daily.     Cholecalciferol (VITAMIN D3) 250 MCG (10000 UT) capsule Take 10,000 Units by mouth once a week.     ferrous sulfate 325 (65 FE) MG tablet Take 325 mg by mouth daily with breakfast.     folic acid (FOLVITE) 1 MG tablet Take 1 mg by mouth daily.     labetalol (NORMODYNE) 200 MG tablet Take 200 mg by mouth 2 (two) times daily.     Multiple Vitamin (MULTIVITAMIN WITH MINERALS) TABS tablet Take 1 tablet by mouth daily. Pro renal D     nitroGLYCERIN (NITROSTAT) 0.4 MG SL tablet Place 0.4 mg under the tongue every 5 (five) minutes as needed for chest pain.     Omega 3-6-9 Fatty Acids (OMEGA-3-6-9 PO) Take 1 capsule by mouth daily.     oxyCODONE-acetaminophen (PERCOCET) 5-325 MG tablet Take 1 tablet by mouth every 4 (four) hours as needed for severe pain. 20 tablet 0   sevelamer carbonate  (RENVELA) 800 MG tablet Take 800 mg by mouth 3 (three) times daily with meals.     vitamin B-12 (CYANOCOBALAMIN) 1000 MCG tablet Take 1,000 mcg by mouth 3 (three) times a week.     vitamin C (ASCORBIC ACID) 500 MG tablet Take 500 mg by mouth daily.     No current facility-administered medications for this visit.     ROS:  See HPI  Vitals:   10/01/21 0920  BP: (!) 121/59  Pulse: 62  Resp: 20  Temp: 98 F (36.7 C)  SpO2: 98%      Physical Exam:  General appearance: awake, alert in NAD Respirations: unlabored; no dyspnea at rest Left upper extremity: Hand is warm with 5/5 grip strength. Motor function and sensation intact. Good bruit and thrill in fistula. 2+ radial pulse. Incision(s): Well approximated. Significant hematoma of upper arm with firmness at level of shoulder.  Bullous lesions overlying incision.  No drainage or increased warmth.     Assessment/Plan:   Patient is 4 days postoperative revision of left brachiocephalic AV fistula.  She has developed a significant hematoma in the past several days with bullous lesions overlying her incision.  Dr. Carlis Abbott examined the patient and gave the patient option of evacuation of the hematoma.  As she is not having  significant pain. just a sensation of tightness, she elects watchful waiting.  We placed Xeroform dressing and Ace wrap to her upper arm and will bring her back to the clinic on Thursday or Friday to assess status of hematoma.  There is extensive ecchymosis without signs of cellulitis, however we will cover her with clindamycin.   Barbie Banner, PA-C 10/01/2021 9:19 AM [facility] [phone]  Clinic MD: Dr. Carlis Abbott

## 2021-10-04 ENCOUNTER — Encounter: Payer: Self-pay | Admitting: Physician Assistant

## 2021-10-04 ENCOUNTER — Ambulatory Visit (INDEPENDENT_AMBULATORY_CARE_PROVIDER_SITE_OTHER): Payer: Medicare Other | Admitting: Physician Assistant

## 2021-10-04 ENCOUNTER — Other Ambulatory Visit: Payer: Self-pay

## 2021-10-04 VITALS — BP 125/67 | HR 59 | Temp 98.1°F | Ht 63.0 in | Wt 270.8 lb

## 2021-10-04 DIAGNOSIS — S40022D Contusion of left upper arm, subsequent encounter: Secondary | ICD-10-CM

## 2021-10-04 DIAGNOSIS — N185 Chronic kidney disease, stage 5: Secondary | ICD-10-CM

## 2021-10-04 NOTE — Progress Notes (Signed)
    Postoperative Access Visit   History of Present Illness   Colleen Gray is a 70 y.o. year old female who returns for post op follow up after revision of left arm AV fistula with superficialization and side branch ligation by Dr. Carlis Abbott on  09/27/21.  She had developed swelling and skin blistering post operatively and was seen in clinic on 10/01/21. She was not having any steal symptoms. She was given prescription for Clindamycin and a Xeroform dressing and ACE wrap was applied to her arm and she was advised to follow up today  She says today that her arm is feeling better. Still having some swelling and tightness in the upper arm. Also still somewhat tender along incisions. Blisters are resolving. She denies any pain or numbness in her arm or hand. She is still taking her Clindamycin. She has been wrapping her arm in ACE bandage but says it has been rolling down with is frustrating to her  She is not currently on hemodialysis. Nephrologist is Dr. Marval Regal  Physical Examination   Vitals:   10/04/21 0839  BP: 125/67  Pulse: (!) 59  Temp: 98.1 F (36.7 C)  TempSrc: Skin  SpO2: 99%  Weight: 270 lb 12.8 oz (122.8 kg)  Height: 5\' 3"  (1.6 m)   Body mass index is 47.97 kg/m.  left arm Incisions are healing  2+ radial pulse, hand grip is 5/5, sensation in digits is intact, palpable thrill, bruit can be auscultated       Ecchymosis present. Bullous lesions resolving. Some improvement of swelling. Large hematoma present. Bacitracin ointment applied over blisters, non adherent gauze, and then two 4" ACE bandages wrapped from wrist to left shoulder  Medical Decision Making   Colleen Gray is a 70 y.o. year old female who presents s/p revision of left arm AV fistula with superficialization and side branch ligation by Dr. Carlis Abbott on 09/27/21. Large hematoma still present but slowly resolving. Remains without pain. Encourage continued elevation and compression with ACE wraps. Continue  conservative management at this time Patent is without signs or symptoms of steal syndrome Will keep close follow up. She will return in 10 days on 10/25 in [facility] clinic. Dr. Carlis Abbott will be available in the office on this day to re evaluate her as well   Colleen Caldwell, PA-C [facility] [place] Office: [phone]  Clinic MD: Dr. Virl Cagey

## 2021-10-08 ENCOUNTER — Ambulatory Visit: Payer: Medicare Other

## 2021-10-15 ENCOUNTER — Encounter: Payer: Self-pay | Admitting: Physician Assistant

## 2021-10-15 ENCOUNTER — Other Ambulatory Visit: Payer: Self-pay

## 2021-10-15 ENCOUNTER — Ambulatory Visit (INDEPENDENT_AMBULATORY_CARE_PROVIDER_SITE_OTHER): Payer: Medicare Other | Admitting: Physician Assistant

## 2021-10-15 VITALS — BP 149/67 | HR 56 | Temp 98.0°F | Resp 20 | Ht 63.0 in | Wt 271.0 lb

## 2021-10-15 DIAGNOSIS — S40022D Contusion of left upper arm, subsequent encounter: Secondary | ICD-10-CM

## 2021-10-15 DIAGNOSIS — N185 Chronic kidney disease, stage 5: Secondary | ICD-10-CM

## 2021-10-15 NOTE — Progress Notes (Signed)
POST OPERATIVE OFFICE NOTE    CC:  F/u for surgery  HPI:  This is a 70 y.o. female who is s/p revision of left arm AV fistula with superficialization and side branch ligation by Dr. Carlis Abbott on  09/27/21.    She had developed swelling and skin blistering post operatively and was seen in clinic on 10/01/21.  She is hear for follow up.  She is not currently on hemodialysis. Nephrologist is Dr. Marval Regal.    Pt returns today for follow up.  Pt states Her arm is feeling better over all.  She is unable to feel flow in the fistula.    Allergies  Allergen Reactions   Nifedipine Swelling   Penicillins Rash    Current Outpatient Medications  Medication Sig Dispense Refill   albuterol (VENTOLIN HFA) 108 (90 Base) MCG/ACT inhaler Inhale 1-2 puffs into the lungs every 6 (six) hours as needed for shortness of breath or wheezing.     Alpha-Lipoic Acid 300 MG CAPS Take 300 mg by mouth daily.     amLODipine (NORVASC) 10 MG tablet Take 10 mg by mouth daily.     Blood Glucose Monitoring Suppl (GLUCOCOM BLOOD GLUCOSE MONITOR) DEVI Use daily to check blood sugar.     calcitRIOL (ROCALTROL) 0.5 MCG capsule Take 0.5 mcg by mouth daily.     calcium elemental as carbonate (BARIATRIC TUMS ULTRA) 400 MG chewable tablet Chew 1,000 mg by mouth 3 (three) times daily.     Cholecalciferol (VITAMIN D3) 250 MCG (10000 UT) capsule Take 10,000 Units by mouth once a week.     clindamycin (CLEOCIN) 300 MG capsule Take 1 capsule (300 mg total) by mouth 3 (three) times daily. 21 capsule 0   ferrous sulfate 325 (65 FE) MG tablet Take 325 mg by mouth daily with breakfast.     folic acid (FOLVITE) 1 MG tablet Take 1 mg by mouth daily.     labetalol (NORMODYNE) 200 MG tablet Take 200 mg by mouth 2 (two) times daily.     Multiple Vitamin (MULTIVITAMIN WITH MINERALS) TABS tablet Take 1 tablet by mouth daily. Pro renal D     nitroGLYCERIN (NITROSTAT) 0.4 MG SL tablet Place 0.4 mg under the tongue every 5 (five) minutes as needed  for chest pain.     Omega 3-6-9 Fatty Acids (OMEGA-3-6-9 PO) Take 1 capsule by mouth daily.     oxyCODONE-acetaminophen (PERCOCET) 5-325 MG tablet Take 1 tablet by mouth every 4 (four) hours as needed for severe pain. 20 tablet 0   sevelamer carbonate (RENVELA) 800 MG tablet Take 800 mg by mouth 3 (three) times daily with meals.     vitamin B-12 (CYANOCOBALAMIN) 1000 MCG tablet Take 1,000 mcg by mouth 3 (three) times a week.     vitamin C (ASCORBIC ACID) 500 MG tablet Take 500 mg by mouth daily.     No current facility-administered medications for this visit.     ROS:  See HPI  Physical Exam:     Incision:  well healed Extremities:  Firm hematoma surrounding the incision, no palpable thrill in fistula, palpable brachial pulse and radial pulse Neuro: sensation and motor of left UE intact    Assessment/Plan:  This is a 70 y.o. female who is s/p:revision/superficialization of the left UE AV fistula with hematoma.    The fistula may have failed at this time.  There is no thrill to palpation and no doppler signal.  I will allow for more healing and decrease in edema.  She will f/u in 43-4 weeks for duplex evaluation and if the fistula is occluded we will have to find an alternative access point.    She has vein mapping from 04/23/21 that shows acceptable right UE cephalic and basilic veins.      Roxy Horseman PA-C [facility] [phone]   Clinic MD:  Carlis Abbott

## 2021-10-16 ENCOUNTER — Other Ambulatory Visit: Payer: Self-pay

## 2021-10-16 DIAGNOSIS — N185 Chronic kidney disease, stage 5: Secondary | ICD-10-CM

## 2021-11-12 ENCOUNTER — Ambulatory Visit (HOSPITAL_COMMUNITY)
Admission: RE | Admit: 2021-11-12 | Discharge: 2021-11-12 | Disposition: A | Discharge status: Home / Self Care | Payer: Medicare Other | Source: Ambulatory Visit | Attending: Vascular Surgery | Admitting: Vascular Surgery

## 2021-11-12 ENCOUNTER — Other Ambulatory Visit: Payer: Self-pay

## 2021-11-12 ENCOUNTER — Ambulatory Visit (INDEPENDENT_AMBULATORY_CARE_PROVIDER_SITE_OTHER): Payer: Medicare Other | Admitting: Physician Assistant

## 2021-11-12 VITALS — BP 138/65 | HR 50 | Temp 98.0°F | Resp 20 | Ht 63.0 in | Wt 271.1 lb

## 2021-11-12 DIAGNOSIS — N185 Chronic kidney disease, stage 5: Secondary | ICD-10-CM | POA: Diagnosis present

## 2021-11-12 DIAGNOSIS — E1142 Type 2 diabetes mellitus with diabetic polyneuropathy: Secondary | ICD-10-CM | POA: Insufficient documentation

## 2021-11-12 DIAGNOSIS — N184 Chronic kidney disease, stage 4 (severe): Secondary | ICD-10-CM

## 2021-11-12 NOTE — Progress Notes (Signed)
POST OPERATIVE OFFICE NOTE    CC:  F/u for surgery  HPI:  This is a 70 y.o. female who is s/p  on left BC superficialization by Dr. Carlis Abbott.    Pt returns today for follow up.  Pt states she Colleen Gray pain, loss of motor and loss of sensation.   She is not on HD at this time.  She is followed by Dr. Marval Regal CK.    Allergies  Allergen Reactions   Nifedipine Swelling   Penicillins Rash    Current Outpatient Medications  Medication Sig Dispense Refill   albuterol (VENTOLIN HFA) 108 (90 Base) MCG/ACT inhaler Inhale 1-2 puffs into the lungs every 6 (six) hours as needed for shortness of breath or wheezing.     Alpha-Lipoic Acid 300 MG CAPS Take 300 mg by mouth daily.     amLODipine (NORVASC) 10 MG tablet Take 10 mg by mouth daily.     Blood Glucose Monitoring Suppl (GLUCOCOM BLOOD GLUCOSE MONITOR) DEVI Use daily to check blood sugar.     calcitRIOL (ROCALTROL) 0.5 MCG capsule Take 0.5 mcg by mouth daily.     calcium elemental as carbonate (BARIATRIC TUMS ULTRA) 400 MG chewable tablet Chew 1,000 mg by mouth 3 (three) times daily.     Cholecalciferol (VITAMIN D3) 250 MCG (10000 UT) capsule Take 10,000 Units by mouth once a week.     ferrous sulfate 325 (65 FE) MG tablet Take 325 mg by mouth daily with breakfast.     folic acid (FOLVITE) 1 MG tablet Take 1 mg by mouth daily.     labetalol (NORMODYNE) 200 MG tablet Take 200 mg by mouth 2 (two) times daily.     Multiple Vitamin (MULTIVITAMIN WITH MINERALS) TABS tablet Take 1 tablet by mouth daily. Pro renal D     nitroGLYCERIN (NITROSTAT) 0.4 MG SL tablet Place 0.4 mg under the tongue every 5 (five) minutes as needed for chest pain.     Omega 3-6-9 Fatty Acids (OMEGA-3-6-9 PO) Take 1 capsule by mouth daily.     sevelamer carbonate (RENVELA) 800 MG tablet Take 800 mg by mouth 3 (three) times daily with meals.     vitamin B-12 (CYANOCOBALAMIN) 1000 MCG tablet Take 1,000 mcg by mouth 3 (three) times a week.     vitamin C (ASCORBIC ACID) 500 MG  tablet Take 500 mg by mouth daily.     No current facility-administered medications for this visit.     ROS:  See HPI  Physical Exam:    Findings:  +--------------------+----------+-----------------+--------+  AVF                 PSV (cm/s)Flow Vol (mL/min)Comments  +--------------------+----------+-----------------+--------+  Native artery inflow   163           467                 +--------------------+----------+-----------------+--------+  AVF Anastomosis         87                               +--------------------+----------+-----------------+--------+      +------------+----------+-------------+----------+--------+  OUTFLOW VEINPSV (cm/s)Diameter (cm)Depth (cm)Describe  +------------+----------+-------------+----------+--------+  Shoulder        0         0.72        0.41   occluded  +------------+----------+-------------+----------+--------+  Prox UA         0  0.47        0.44   occluded  +------------+----------+-------------+----------+--------+  Mid UA          0         0.66        0.80   occluded  +------------+----------+-------------+----------+--------+    Summary:  Arteriovenous fistula-Thrombus noted from the shoulder segment of the  cephalic  outflow vein to approximately    Incision:  Well healed Extremities:  grip 5/5, palpable radial pulse and sensation intact No palpable thrill in the fistula on exam   Assessment/Plan:  This is a 70 y.o. female who is s/p:superficialization of the left UE BC av fistula that has now failed.  I reviewed the vain mapping that was performed in May 2022 and she has acceptable basilic vein as well as right UE cephalic and basilic veins.  I discussed her options of starting over with the right UE verses venous work up for central venous stenosis and left basilic fistula creation.  She has declined new access surgery at this time.  She wishes to discuss her options with Dr.  Verneda Skill first.  She will f/u PRN.   Roxy Horseman PA-C [facility] [phone]   Clinic MD:  Stanford Breed

## 2022-02-26 ENCOUNTER — Other Ambulatory Visit: Payer: Self-pay | Admitting: Gastroenterology

## 2022-05-27 ENCOUNTER — Other Ambulatory Visit: Payer: Self-pay | Admitting: *Deleted

## 2022-05-27 ENCOUNTER — Encounter (HOSPITAL_COMMUNITY): Payer: Self-pay | Admitting: Gastroenterology

## 2022-05-27 DIAGNOSIS — N185 Chronic kidney disease, stage 5: Secondary | ICD-10-CM

## 2022-05-28 NOTE — Progress Notes (Signed)
Attempted to obtain medical history via telephone, unable to reach at this time. Unable to leave voicemail message left requesting return call to pre surgical testing department.

## 2022-06-06 ENCOUNTER — Encounter (HOSPITAL_COMMUNITY): Payer: Self-pay

## 2022-06-06 ENCOUNTER — Ambulatory Visit (HOSPITAL_COMMUNITY): Admit: 2022-06-06 | Payer: Medicare Other | Admitting: Gastroenterology

## 2022-06-06 SURGERY — COLONOSCOPY WITH PROPOFOL
Anesthesia: Monitor Anesthesia Care

## 2022-06-09 ENCOUNTER — Ambulatory Visit (HOSPITAL_COMMUNITY)
Admission: RE | Admit: 2022-06-09 | Discharge: 2022-06-09 | Disposition: A | Discharge status: Home / Self Care | Payer: Medicare Other | Source: Ambulatory Visit | Attending: Surgery | Admitting: Surgery

## 2022-06-09 ENCOUNTER — Ambulatory Visit (INDEPENDENT_AMBULATORY_CARE_PROVIDER_SITE_OTHER)
Admission: RE | Admit: 2022-06-09 | Discharge: 2022-06-09 | Disposition: A | Discharge status: Home / Self Care | Payer: Medicare Other | Source: Ambulatory Visit | Attending: Surgery | Admitting: Surgery

## 2022-06-09 ENCOUNTER — Encounter: Payer: Self-pay | Admitting: Surgery

## 2022-06-09 ENCOUNTER — Ambulatory Visit (INDEPENDENT_AMBULATORY_CARE_PROVIDER_SITE_OTHER): Payer: Medicare Other | Admitting: Surgery

## 2022-06-09 VITALS — BP 148/79 | HR 52 | Temp 98.7°F | Resp 20 | Ht 63.0 in | Wt 265.0 lb

## 2022-06-09 DIAGNOSIS — N185 Chronic kidney disease, stage 5: Secondary | ICD-10-CM | POA: Insufficient documentation

## 2022-06-09 NOTE — Progress Notes (Signed)
[facility] [place]  Patient name: Colleen Gray MRN: [id number] DOB: [birth year] Sex: female   REQUESTING PROVIDER:    Dr. Marval Regal   REASON FOR CONSULT:    Access  HISTORY OF PRESENT ILLNESS:   Colleen Gray is a 71 y.o. female, who is here today for new access.  She has undergone the following procedures: 05/24/2021: Left brachiocephalic fistula Carlis Abbott) 40/08/8118: Superficialization and branch ligation of left brachiocephalic fistula Carlis Abbott)  She is right-handed, however has not great use of her right hand secondary to shoulder surgery and would prefer access again in her left arm.  She does not have a pacemaker.  The patient suffers from diabetes.  He is medically managed for hypertension.  He has hypercholesterolemia.  He is status post gastric bypass in 2006-04-02 for morbid obesity.  He has a history of rheumatic valvular heart disease  PAST MEDICAL HISTORY    Past Medical History:  Diagnosis Date   Anemia    Arthritis    Asthma    Bradycardia    Chronic renal failure, stage 4 (severe) ([place]) April 02, 2016   no dialysis at this time, Stage 5 (as of 12/25/76)   Complication of anesthesia    during anesthesia heart rate is low    Enlarged heart    Sees Dr. Einar Gip   Heart murmur    due to rheumatic fever - Sees Dr Einar Gip   Hemorrhoid    History of kidney stones    History of rheumatic fever    with a heart murmer   Hypertension    Ovarian cyst 1985   removed   Pneumonia    Pre-diabetes    no meds   Sleep apnea    does not use Cpap (after weight loss no longer needed)     FAMILY HISTORY   Family History  Problem Relation Age of Onset   Colon cancer Mother 15   Diabetes Mother    Hypertension Mother    Diabetes Father    Hypertension Father    Colon cancer Maternal Grandmother 96   Diabetes Paternal Grandmother    Diabetes Paternal Grandfather    Breast cancer Neg Hx     SOCIAL HISTORY:   Social  History   Socioeconomic History   Marital status: Widowed    Spouse name: Not on file   Number of children: 0   Years of education: Not on file   Highest education level: Not on file  Occupational History   Not on file  Tobacco Use   Smoking status: Never   Smokeless tobacco: Never  Substance and Sexual Activity   Alcohol use: No    Alcohol/week: 0.0 standard drinks of alcohol   Drug use: No   Sexual activity: Yes    Partners: Male    Birth control/protection: Post-menopausal  Other Topics Concern   Not on file  Social History Narrative   Pt husband died in 02-Apr-2012 from esophageal rupture.  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   Social Determinants of Health   Financial Resource Strain: Not on file  Food Insecurity: Not on file  Transportation Needs: Not on file  Physical Activity: Not on file  Stress: Not on file  Social Connections: Not on file  Intimate Partner Violence: Not on file    ALLERGIES:    Allergies  Allergen Reactions   Nifedipine  Swelling   Penicillins Rash    CURRENT MEDICATIONS:    Current Outpatient Medications  Medication Sig Dispense Refill   albuterol (VENTOLIN HFA) 108 (90 Base) MCG/ACT inhaler Inhale 1-2 puffs into the lungs every 6 (six) hours as needed for shortness of breath or wheezing.     Alpha-Lipoic Acid 300 MG CAPS Take 300 mg by mouth daily.     amLODipine (NORVASC) 10 MG tablet Take 10 mg by mouth daily.     Blood Glucose Monitoring Suppl (GLUCOCOM BLOOD GLUCOSE MONITOR) DEVI Use daily to check blood sugar.     calcitRIOL (ROCALTROL) 0.5 MCG capsule Take 0.5 mcg by mouth daily.     calcium elemental as carbonate (BARIATRIC TUMS ULTRA) 400 MG chewable tablet Chew 1,000 mg by mouth 3 (three) times daily.     Cholecalciferol (VITAMIN D3) 250 MCG (10000 UT) capsule Take 10,000 Units by mouth once a week.     ferrous sulfate 325  (65 FE) MG tablet Take 325 mg by mouth daily with breakfast.     folic acid (FOLVITE) 1 MG tablet Take 1 mg by mouth daily.     labetalol (NORMODYNE) 200 MG tablet Take 200 mg by mouth 2 (two) times daily.     Multiple Vitamin (MULTIVITAMIN WITH MINERALS) TABS tablet Take 1 tablet by mouth daily. Pro renal D     nitroGLYCERIN (NITROSTAT) 0.4 MG SL tablet Place 0.4 mg under the tongue every 5 (five) minutes as needed for chest pain.     Omega 3-6-9 Fatty Acids (OMEGA-3-6-9 PO) Take 1 capsule by mouth daily.     sevelamer carbonate (RENVELA) 800 MG tablet Take 800 mg by mouth 3 (three) times daily with meals.     vitamin B-12 (CYANOCOBALAMIN) 1000 MCG tablet Take 1,000 mcg by mouth 3 (three) times a week.     vitamin C (ASCORBIC ACID) 500 MG tablet Take 500 mg by mouth daily.     No current facility-administered medications for this visit.    REVIEW OF SYSTEMS:   '[X]'$  denotes positive finding, '[ ]'$  denotes negative finding Cardiac  Comments:  Chest pain or chest pressure:    Shortness of breath upon exertion:    Short of breath when lying flat:    Irregular heart rhythm:        Vascular    Pain in calf, thigh, or hip brought on by ambulation:    Pain in feet at night that wakes you up from your sleep:     Blood clot in your veins:    Leg swelling:         Pulmonary    Oxygen at home:    Productive cough:     Wheezing:         Neurologic    Sudden weakness in arms or legs:     Sudden numbness in arms or legs:     Sudden onset of difficulty speaking or slurred speech:    Temporary loss of vision in one eye:     Problems with dizziness:         Gastrointestinal    Blood in stool:      Vomited blood:         Genitourinary    Burning when urinating:     Blood in urine:        Psychiatric    Major depression:         Hematologic    Bleeding problems:    Problems with blood clotting  too easily:        Skin    Rashes or ulcers:        Constitutional    Fever or chills:      PHYSICAL EXAM:   Vitals:   06/09/22 1121  BP: (!) 148/79  Pulse: (!) 52  Resp: 20  Temp: 98.7 F (37.1 C)  SpO2: 95%  Weight: 265 lb (120.2 kg)  Height: '5\' 3"'$  (1.6 m)    GENERAL: The patient is a well-nourished female, in no acute distress. The vital signs are documented above. CARDIAC: There is a regular rate and rhythm.  VASCULAR: Palpable left brachial pulse PULMONARY: Nonlabored respirations NEUROLOGIC: No focal weakness or paresthesias are detected. SKIN: There are no ulcers or rashes noted. PSYCHIATRIC: The patient has a normal affect.  STUDIES:   I have reviewed her vein mapping studies with the following findings: +-----------------+-------------+----------+--------+  Left Basilic     Diameter (cm)Depth (cm)Findings  +-----------------+-------------+----------+--------+  Mid upper arm        0.57                         +-----------------+-------------+----------+--------+  Dist upper arm       0.53                         +-----------------+-------------+----------+--------+  Antecubital fossa    0.59                         +-----------------+-------------+----------+--------+  Prox forearm         0.46                         +-----------------+-------------+----------+--------+  ASSESSMENT and PLAN   CKD 5: The patient's left brachiocephalic fistula has occluded.  She now needs new access.  She appears to have an excellent left basilic vein.  I discussed proceeding with a left basilic vein fistula creation.  This will be done in 2 separate stages.  I discussed the details the operation as well as the risks and benefits.  All questions were answered.  We will need to get this done soon as possible.  She she needs to coordinate with her caregiver regarding transportation for surgery.  The patient states that she had significant blistering last time she is wondering if this is secondary to the Dermabond.  I told her we would place  S[facility]ps.   Leia Alf, MD, FACS [facility] [phone] Pager [phone]

## 2022-06-09 NOTE — H&P (View-Only) (Signed)
[facility] [place]  Patient name: Colleen Gray MRN: [id number] DOB: 10-Sep-1951 Sex: female   REQUESTING PROVIDER:    Dr. Marval Regal   REASON FOR CONSULT:    Access  HISTORY OF PRESENT ILLNESS:   Colleen Gray is a 71 y.o. female, who is here today for new access.  She has undergone the following procedures: 05/24/2021: Left brachiocephalic fistula Carlis Abbott) 46/04/353: Superficialization and branch ligation of left brachiocephalic fistula Carlis Abbott)  She is right-handed, however has not great use of her right hand secondary to shoulder surgery and would prefer access again in her left arm.  She does not have a pacemaker.  The patient suffers from diabetes.  He is medically managed for hypertension.  He has hypercholesterolemia.  He is status post gastric bypass in 11-Mar-2006 for morbid obesity.  He has a history of rheumatic valvular heart disease  PAST MEDICAL HISTORY    Past Medical History:  Diagnosis Date   Anemia    Arthritis    Asthma    Bradycardia    Chronic renal failure, stage 4 (severe) ([place]) March 11, 2016   no dialysis at this time, Stage 5 (as of 05/27/67)   Complication of anesthesia    during anesthesia heart rate is low    Enlarged heart    Sees Dr. Einar Gip   Heart murmur    due to rheumatic fever - Sees Dr Einar Gip   Hemorrhoid    History of kidney stones    History of rheumatic fever    with a heart murmer   Hypertension    Ovarian cyst 1985   removed   Pneumonia    Pre-diabetes    no meds   Sleep apnea    does not use Cpap (after weight loss no longer needed)     FAMILY HISTORY   Family History  Problem Relation Age of Onset   Colon cancer Mother 68   Diabetes Mother    Hypertension Mother    Diabetes Father    Hypertension Father    Colon cancer Maternal Grandmother 96   Diabetes Paternal Grandmother    Diabetes Paternal Grandfather    Breast cancer Neg Hx     SOCIAL HISTORY:   Social  History   Socioeconomic History   Marital status: Widowed    Spouse name: Not on file   Number of children: 0   Years of education: Not on file   Highest education level: Not on file  Occupational History   Not on file  Tobacco Use   Smoking status: Never   Smokeless tobacco: Never  Substance and Sexual Activity   Alcohol use: No    Alcohol/week: 0.0 standard drinks of alcohol   Drug use: No   Sexual activity: Yes    Partners: Male    Birth control/protection: Post-menopausal  Other Topics Concern   Not on file  Social History Narrative   Pt husband died in 03/11/2012 from esophageal rupture.  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   Social Determinants of Health   Financial Resource Strain: Not on file  Food Insecurity: Not on file  Transportation Needs: Not on file  Physical Activity: Not on file  Stress: Not on file  Social Connections: Not on file  Intimate Partner Violence: Not on file    ALLERGIES:    Allergies  Allergen Reactions   Nifedipine  Swelling   Penicillins Rash    CURRENT MEDICATIONS:    Current Outpatient Medications  Medication Sig Dispense Refill   albuterol (VENTOLIN HFA) 108 (90 Base) MCG/ACT inhaler Inhale 1-2 puffs into the lungs every 6 (six) hours as needed for shortness of breath or wheezing.     Alpha-Lipoic Acid 300 MG CAPS Take 300 mg by mouth daily.     amLODipine (NORVASC) 10 MG tablet Take 10 mg by mouth daily.     Blood Glucose Monitoring Suppl (GLUCOCOM BLOOD GLUCOSE MONITOR) DEVI Use daily to check blood sugar.     calcitRIOL (ROCALTROL) 0.5 MCG capsule Take 0.5 mcg by mouth daily.     calcium elemental as carbonate (BARIATRIC TUMS ULTRA) 400 MG chewable tablet Chew 1,000 mg by mouth 3 (three) times daily.     Cholecalciferol (VITAMIN D3) 250 MCG (10000 UT) capsule Take 10,000 Units by mouth once a week.     ferrous sulfate 325  (65 FE) MG tablet Take 325 mg by mouth daily with breakfast.     folic acid (FOLVITE) 1 MG tablet Take 1 mg by mouth daily.     labetalol (NORMODYNE) 200 MG tablet Take 200 mg by mouth 2 (two) times daily.     Multiple Vitamin (MULTIVITAMIN WITH MINERALS) TABS tablet Take 1 tablet by mouth daily. Pro renal D     nitroGLYCERIN (NITROSTAT) 0.4 MG SL tablet Place 0.4 mg under the tongue every 5 (five) minutes as needed for chest pain.     Omega 3-6-9 Fatty Acids (OMEGA-3-6-9 PO) Take 1 capsule by mouth daily.     sevelamer carbonate (RENVELA) 800 MG tablet Take 800 mg by mouth 3 (three) times daily with meals.     vitamin B-12 (CYANOCOBALAMIN) 1000 MCG tablet Take 1,000 mcg by mouth 3 (three) times a week.     vitamin C (ASCORBIC ACID) 500 MG tablet Take 500 mg by mouth daily.     No current facility-administered medications for this visit.    REVIEW OF SYSTEMS:   '[X]'$  denotes positive finding, '[ ]'$  denotes negative finding Cardiac  Comments:  Chest pain or chest pressure:    Shortness of breath upon exertion:    Short of breath when lying flat:    Irregular heart rhythm:        Vascular    Pain in calf, thigh, or hip brought on by ambulation:    Pain in feet at night that wakes you up from your sleep:     Blood clot in your veins:    Leg swelling:         Pulmonary    Oxygen at home:    Productive cough:     Wheezing:         Neurologic    Sudden weakness in arms or legs:     Sudden numbness in arms or legs:     Sudden onset of difficulty speaking or slurred speech:    Temporary loss of vision in one eye:     Problems with dizziness:         Gastrointestinal    Blood in stool:      Vomited blood:         Genitourinary    Burning when urinating:     Blood in urine:        Psychiatric    Major depression:         Hematologic    Bleeding problems:    Problems with blood clotting  too easily:        Skin    Rashes or ulcers:        Constitutional    Fever or chills:      PHYSICAL EXAM:   Vitals:   06/09/22 1121  BP: (!) 148/79  Pulse: (!) 52  Resp: 20  Temp: 98.7 F (37.1 C)  SpO2: 95%  Weight: 265 lb (120.2 kg)  Height: '5\' 3"'$  (1.6 m)    GENERAL: The patient is a well-nourished female, in no acute distress. The vital signs are documented above. CARDIAC: There is a regular rate and rhythm.  VASCULAR: Palpable left brachial pulse PULMONARY: Nonlabored respirations NEUROLOGIC: No focal weakness or paresthesias are detected. SKIN: There are no ulcers or rashes noted. PSYCHIATRIC: The patient has a normal affect.  STUDIES:   I have reviewed her vein mapping studies with the following findings: +-----------------+-------------+----------+--------+  Left Basilic     Diameter (cm)Depth (cm)Findings  +-----------------+-------------+----------+--------+  Mid upper arm        0.57                         +-----------------+-------------+----------+--------+  Dist upper arm       0.53                         +-----------------+-------------+----------+--------+  Antecubital fossa    0.59                         +-----------------+-------------+----------+--------+  Prox forearm         0.46                         +-----------------+-------------+----------+--------+  ASSESSMENT and PLAN   CKD 5: The patient's left brachiocephalic fistula has occluded.  She now needs new access.  She appears to have an excellent left basilic vein.  I discussed proceeding with a left basilic vein fistula creation.  This will be done in 2 separate stages.  I discussed the details the operation as well as the risks and benefits.  All questions were answered.  We will need to get this done soon as possible.  She she needs to coordinate with her caregiver regarding transportation for surgery.  The patient states that she had significant blistering last time she is wondering if this is secondary to the Dermabond.  I told her we would place  Steri-Strips.   Leia Alf, MD, FACS [facility] [phone] Pager [phone]

## 2022-06-11 ENCOUNTER — Other Ambulatory Visit: Payer: Self-pay

## 2022-06-11 DIAGNOSIS — N185 Chronic kidney disease, stage 5: Secondary | ICD-10-CM

## 2022-07-08 ENCOUNTER — Encounter (HOSPITAL_COMMUNITY): Payer: Self-pay | Admitting: Surgery

## 2022-07-08 ENCOUNTER — Other Ambulatory Visit: Payer: Self-pay

## 2022-07-08 NOTE — Progress Notes (Addendum)
Spoke with pt for pre-op call. Pt has  hx of HTN, CKD and Pre-diabetes. Denies any recent chest pain or shortness of breath. States she does have Psoriasis all over her skin. Pt is aware she cannot [place] the cream in the AM but I told her to bring it with her so she can use once she is admitted after surgery. Pt's last A1C ws 5.9 on 01/15/22.  Shower instructions given to pt and she voiced understanding.

## 2022-07-09 ENCOUNTER — Encounter (HOSPITAL_COMMUNITY)
Admission: RE | Disposition: A | Discharge status: Home / Self Care | Payer: Self-pay | Source: Ambulatory Visit | Attending: Surgery

## 2022-07-09 ENCOUNTER — Ambulatory Visit (HOSPITAL_BASED_OUTPATIENT_CLINIC_OR_DEPARTMENT_OTHER): Payer: Medicare Other | Admitting: Certified Registered Nurse Anesthetist

## 2022-07-09 ENCOUNTER — Encounter (HOSPITAL_COMMUNITY): Payer: Self-pay | Admitting: Surgery

## 2022-07-09 ENCOUNTER — Other Ambulatory Visit: Payer: Self-pay

## 2022-07-09 ENCOUNTER — Ambulatory Visit (HOSPITAL_COMMUNITY): Payer: Medicare Other | Admitting: Certified Registered Nurse Anesthetist

## 2022-07-09 ENCOUNTER — Observation Stay (HOSPITAL_COMMUNITY)
Admission: RE | Admit: 2022-07-09 | Discharge: 2022-07-10 | Disposition: A | Discharge status: Home / Self Care | Payer: Medicare Other | Source: Ambulatory Visit | Attending: Surgery | Admitting: Surgery

## 2022-07-09 DIAGNOSIS — D631 Anemia in chronic kidney disease: Secondary | ICD-10-CM | POA: Diagnosis not present

## 2022-07-09 DIAGNOSIS — N186 End stage renal disease: Secondary | ICD-10-CM

## 2022-07-09 DIAGNOSIS — Y841 Kidney dialysis as the cause of abnormal reaction of the patient, or of later complication, without mention of misadventure at the time of the procedure: Secondary | ICD-10-CM | POA: Diagnosis not present

## 2022-07-09 DIAGNOSIS — Z992 Dependence on renal dialysis: Secondary | ICD-10-CM | POA: Insufficient documentation

## 2022-07-09 DIAGNOSIS — N185 Chronic kidney disease, stage 5: Secondary | ICD-10-CM | POA: Diagnosis not present

## 2022-07-09 DIAGNOSIS — E1122 Type 2 diabetes mellitus with diabetic chronic kidney disease: Secondary | ICD-10-CM | POA: Diagnosis not present

## 2022-07-09 DIAGNOSIS — Z79899 Other long term (current) drug therapy: Secondary | ICD-10-CM | POA: Insufficient documentation

## 2022-07-09 DIAGNOSIS — T82590A Other mechanical complication of surgically created arteriovenous fistula, initial encounter: Principal | ICD-10-CM | POA: Insufficient documentation

## 2022-07-09 DIAGNOSIS — T82898A Other specified complication of vascular prosthetic devices, implants and grafts, initial encounter: Secondary | ICD-10-CM | POA: Diagnosis not present

## 2022-07-09 DIAGNOSIS — I120 Hypertensive chronic kidney disease with stage 5 chronic kidney disease or end stage renal disease: Secondary | ICD-10-CM | POA: Diagnosis not present

## 2022-07-09 DIAGNOSIS — E7800 Pure hypercholesterolemia, unspecified: Secondary | ICD-10-CM | POA: Diagnosis not present

## 2022-07-09 DIAGNOSIS — Z6841 Body Mass Index (BMI) 40.0 and over, adult: Secondary | ICD-10-CM | POA: Insufficient documentation

## 2022-07-09 HISTORY — PX: BASCILIC VEIN TRANSPOSITION: SHX5742

## 2022-07-09 HISTORY — DX: Psoriasis, unspecified: L40.9

## 2022-07-09 LAB — POCT I-STAT, CHEM 8
BUN: 58 mg/dL — ABNORMAL HIGH (ref 8–23)
Calcium, Ion: 1.13 mmol/L — ABNORMAL LOW (ref 1.15–1.40)
Chloride: 109 mmol/L (ref 98–111)
Creatinine, Ser: 4.8 mg/dL — ABNORMAL HIGH (ref 0.44–1.00)
Glucose, Bld: 129 mg/dL — ABNORMAL HIGH (ref 70–99)
HCT: 34 % — ABNORMAL LOW (ref 36.0–46.0)
Hemoglobin: 11.6 g/dL — ABNORMAL LOW (ref 12.0–15.0)
Potassium: 4.3 mmol/L (ref 3.5–5.1)
Sodium: 144 mmol/L (ref 135–145)
TCO2: 22 mmol/L (ref 22–32)

## 2022-07-09 SURGERY — TRANSPOSITION, VEIN, BASILIC
Anesthesia: General | Site: Arm Upper | Laterality: Left

## 2022-07-09 MED ORDER — SODIUM CHLORIDE 0.9 % IV SOLN: 250.0000 mL | INTRAVENOUS | Status: DC | PRN

## 2022-07-09 MED ORDER — ORAL CARE MOUTH RINSE: 15.0000 mL | Freq: Once | OROMUCOSAL | Status: DC

## 2022-07-09 MED ORDER — BUPIVACAINE LIPOSOME 1.3 % IJ SUSP
INTRAMUSCULAR | Status: AC
  Filled 2022-07-09: qty 20

## 2022-07-09 MED ORDER — BUPIVACAINE HCL (PF) 0.5 % IJ SOLN
INTRAMUSCULAR | Status: AC
  Filled 2022-07-09: qty 30

## 2022-07-09 MED ORDER — AMLODIPINE BESYLATE 10 MG PO TABS
10.0000 mg | ORAL_TABLET | Freq: Every day | ORAL | Status: DC
  Administered 2022-07-10: 10 mg via ORAL
  Filled 2022-07-09: qty 1

## 2022-07-09 MED ORDER — FENTANYL CITRATE (PF) 250 MCG/5ML IJ SOLN
INTRAMUSCULAR | Status: AC
  Filled 2022-07-09: qty 5

## 2022-07-09 MED ORDER — PROPOFOL 10 MG/ML IV BOLUS
INTRAVENOUS | Status: AC
  Filled 2022-07-09: qty 20

## 2022-07-09 MED ORDER — LABETALOL HCL 5 MG/ML IV SOLN: 10.0000 mg | INTRAVENOUS | Status: DC | PRN

## 2022-07-09 MED ORDER — CHLORHEXIDINE GLUCONATE 0.12 % MT SOLN: 15.0000 mL | Freq: Once | OROMUCOSAL | Status: DC

## 2022-07-09 MED ORDER — LIDOCAINE 2% (20 MG/ML) 5 ML SYRINGE
INTRAMUSCULAR | Status: AC
  Filled 2022-07-09: qty 5

## 2022-07-09 MED ORDER — 0.9 % SODIUM CHLORIDE (POUR BTL) OPTIME
TOPICAL | Status: DC | PRN
  Administered 2022-07-09: 1000 mL

## 2022-07-09 MED ORDER — EPHEDRINE SULFATE-NACL 50-0.9 MG/10ML-% IV SOSY
PREFILLED_SYRINGE | INTRAVENOUS | Status: DC | PRN
  Administered 2022-07-09 (×5): 5 mg via INTRAVENOUS

## 2022-07-09 MED ORDER — OXYCODONE HCL 5 MG PO TABS: 5.0000 mg | ORAL_TABLET | Freq: Once | ORAL | Status: DC | PRN

## 2022-07-09 MED ORDER — CEFAZOLIN SODIUM-DEXTROSE 2-4 GM/100ML-% IV SOLN
2.0000 g | Freq: Once | INTRAVENOUS | Status: AC
Start: 2022-07-09 — End: 2022-07-09
  Administered 2022-07-09: 2 g via INTRAVENOUS
  Filled 2022-07-09: qty 100

## 2022-07-09 MED ORDER — PROPOFOL 10 MG/ML IV BOLUS
INTRAVENOUS | Status: DC | PRN
  Administered 2022-07-09: 50 mg via INTRAVENOUS
  Administered 2022-07-09: 150 mg via INTRAVENOUS

## 2022-07-09 MED ORDER — OXYCODONE HCL 5 MG/5ML PO SOLN: 5.0000 mg | Freq: Once | ORAL | Status: DC | PRN

## 2022-07-09 MED ORDER — HYDROMORPHONE HCL 1 MG/ML IJ SOLN: 0.5000 mg | INTRAMUSCULAR | Status: DC | PRN

## 2022-07-09 MED ORDER — SURGIFLO WITH THROMBIN (HEMOSTATIC MATRIX KIT) OPTIME
TOPICAL | Status: DC | PRN
  Administered 2022-07-09: 1 via TOPICAL

## 2022-07-09 MED ORDER — SODIUM CHLORIDE 0.9% FLUSH: 3.0000 mL | INTRAVENOUS | Status: DC | PRN

## 2022-07-09 MED ORDER — ONDANSETRON HCL 4 MG/2ML IJ SOLN: 4.0000 mg | Freq: Four times a day (QID) | INTRAMUSCULAR | Status: DC | PRN

## 2022-07-09 MED ORDER — LIDOCAINE-EPINEPHRINE (PF) 1 %-1:200000 IJ SOLN
INTRAMUSCULAR | Status: AC
  Filled 2022-07-09: qty 30

## 2022-07-09 MED ORDER — HEPARIN 6000 UNIT IRRIGATION SOLUTION
Status: DC | PRN
  Administered 2022-07-09: 1

## 2022-07-09 MED ORDER — ONDANSETRON HCL 4 MG/2ML IJ SOLN
INTRAMUSCULAR | Status: AC
  Filled 2022-07-09: qty 2

## 2022-07-09 MED ORDER — METOPROLOL TARTRATE 5 MG/5ML IV SOLN: 2.0000 mg | INTRAVENOUS | Status: DC | PRN

## 2022-07-09 MED ORDER — SUCCINYLCHOLINE CHLORIDE 200 MG/10ML IV SOSY
PREFILLED_SYRINGE | INTRAVENOUS | Status: AC
  Filled 2022-07-09: qty 10

## 2022-07-09 MED ORDER — DEXAMETHASONE SODIUM PHOSPHATE 10 MG/ML IJ SOLN
INTRAMUSCULAR | Status: AC
  Filled 2022-07-09: qty 1

## 2022-07-09 MED ORDER — SEVELAMER CARBONATE 800 MG PO TABS
800.0000 mg | ORAL_TABLET | Freq: Three times a day (TID) | ORAL | Status: DC
Start: 2022-07-09 — End: 2022-07-10
  Administered 2022-07-09 – 2022-07-10 (×2): 800 mg via ORAL
  Filled 2022-07-09 (×2): qty 1

## 2022-07-09 MED ORDER — OXYCODONE-ACETAMINOPHEN 5-325 MG PO TABS: 1.0000 | ORAL_TABLET | ORAL | 0 refills | Status: DC | PRN

## 2022-07-09 MED ORDER — SODIUM CHLORIDE 0.9 % IV SOLN: INTRAVENOUS | Status: DC

## 2022-07-09 MED ORDER — FENTANYL CITRATE (PF) 100 MCG/2ML IJ SOLN
25.0000 ug | INTRAMUSCULAR | Status: DC | PRN
  Administered 2022-07-09: 25 ug via INTRAVENOUS

## 2022-07-09 MED ORDER — ACETAMINOPHEN 650 MG RE SUPP: 325.0000 mg | RECTAL | Status: DC | PRN

## 2022-07-09 MED ORDER — HEPARIN 6000 UNIT IRRIGATION SOLUTION
Status: AC
  Filled 2022-07-09: qty 500

## 2022-07-09 MED ORDER — LIDOCAINE 2% (20 MG/ML) 5 ML SYRINGE
INTRAMUSCULAR | Status: DC | PRN
  Administered 2022-07-09: 60 mg via INTRAVENOUS

## 2022-07-09 MED ORDER — LABETALOL HCL 200 MG PO TABS
200.0000 mg | ORAL_TABLET | Freq: Two times a day (BID) | ORAL | Status: DC
  Administered 2022-07-09 – 2022-07-10 (×2): 200 mg via ORAL
  Filled 2022-07-09 (×2): qty 1

## 2022-07-09 MED ORDER — SODIUM CHLORIDE 0.9% FLUSH
3.0000 mL | Freq: Two times a day (BID) | INTRAVENOUS | Status: DC
  Administered 2022-07-09 – 2022-07-10 (×2): 3 mL via INTRAVENOUS

## 2022-07-09 MED ORDER — ALUM & MAG HYDROXIDE-SIMETH 200-200-20 MG/5ML PO SUSP: 15.0000 mL | ORAL | Status: DC | PRN

## 2022-07-09 MED ORDER — HYDRALAZINE HCL 20 MG/ML IJ SOLN: 5.0000 mg | INTRAMUSCULAR | Status: DC | PRN

## 2022-07-09 MED ORDER — CHLORHEXIDINE GLUCONATE 0.12 % MT SOLN
OROMUCOSAL | Status: AC
  Administered 2022-07-09: 15 mL
  Filled 2022-07-09: qty 15

## 2022-07-09 MED ORDER — GUAIFENESIN-DM 100-10 MG/5ML PO SYRP: 15.0000 mL | ORAL_SOLUTION | ORAL | Status: DC | PRN

## 2022-07-09 MED ORDER — PANTOPRAZOLE SODIUM 40 MG PO TBEC
40.0000 mg | DELAYED_RELEASE_TABLET | Freq: Every day | ORAL | Status: DC
  Administered 2022-07-09 – 2022-07-10 (×2): 40 mg via ORAL
  Filled 2022-07-09 (×2): qty 1

## 2022-07-09 MED ORDER — ONDANSETRON HCL 4 MG/2ML IJ SOLN
INTRAMUSCULAR | Status: DC | PRN
  Administered 2022-07-09: 4 mg via INTRAVENOUS

## 2022-07-09 MED ORDER — DOCUSATE SODIUM 100 MG PO CAPS
100.0000 mg | ORAL_CAPSULE | Freq: Two times a day (BID) | ORAL | Status: DC
  Filled 2022-07-09 (×2): qty 1

## 2022-07-09 MED ORDER — FENTANYL CITRATE (PF) 100 MCG/2ML IJ SOLN
INTRAMUSCULAR | Status: AC
  Filled 2022-07-09: qty 2

## 2022-07-09 MED ORDER — VANCOMYCIN HCL IN DEXTROSE 1-5 GM/200ML-% IV SOLN
1000.0000 mg | Freq: Once | INTRAVENOUS | Status: AC
  Administered 2022-07-09: 1000 mg via INTRAVENOUS
  Filled 2022-07-09 (×2): qty 200

## 2022-07-09 MED ORDER — CHLORHEXIDINE GLUCONATE 4 % EX LIQD: 60.0000 mL | Freq: Once | CUTANEOUS | Status: DC

## 2022-07-09 MED ORDER — ROCURONIUM BROMIDE 10 MG/ML (PF) SYRINGE
PREFILLED_SYRINGE | INTRAVENOUS | Status: AC
  Filled 2022-07-09: qty 10

## 2022-07-09 MED ORDER — PHENOL 1.4 % MT LIQD: 1.0000 | OROMUCOSAL | Status: DC | PRN

## 2022-07-09 MED ORDER — ACETAMINOPHEN 325 MG PO TABS: 325.0000 mg | ORAL_TABLET | ORAL | Status: DC | PRN

## 2022-07-09 MED ORDER — PROPOFOL 1000 MG/100ML IV EMUL
INTRAVENOUS | Status: AC
Start: 2022-07-09 — End: ?
  Filled 2022-07-09: qty 100

## 2022-07-09 MED ORDER — OXYCODONE-ACETAMINOPHEN 5-325 MG PO TABS
1.0000 | ORAL_TABLET | ORAL | Status: DC | PRN
  Administered 2022-07-09: 1 via ORAL
  Filled 2022-07-09: qty 1

## 2022-07-09 MED ORDER — POTASSIUM CHLORIDE CRYS ER 20 MEQ PO TBCR: 20.0000 meq | EXTENDED_RELEASE_TABLET | Freq: Once | ORAL | Status: DC

## 2022-07-09 MED ORDER — FENTANYL CITRATE (PF) 250 MCG/5ML IJ SOLN
INTRAMUSCULAR | Status: DC | PRN
  Administered 2022-07-09 (×2): 25 ug via INTRAVENOUS

## 2022-07-09 SURGICAL SUPPLY — 38 items
ARMBAND PINK RESTRICT EXTREMIT (MISCELLANEOUS) ×2 IMPLANT
BAG COUNTER SPONGE SURGICOUNT (BAG) ×2 IMPLANT
BENZOIN TINCTURE PRP APPL 2/3 (GAUZE/BANDAGES/DRESSINGS) ×1 IMPLANT
CANISTER SUCT 3000ML PPV (MISCELLANEOUS) ×2 IMPLANT
CLIP TI WIDE RED SMALL 6 (CLIP) ×1 IMPLANT
CLIP VESOCCLUDE MED 24/CT (CLIP) IMPLANT
CLIP VESOCCLUDE MED 6/CT (CLIP) ×1 IMPLANT
CLIP VESOCCLUDE SM WIDE 24/CT (CLIP) IMPLANT
CLIP VESOCCLUDE SM WIDE 6/CT (CLIP) ×1 IMPLANT
CLSR STERI-STRIP ANTIMIC 1/2X4 (GAUZE/BANDAGES/DRESSINGS) ×1 IMPLANT
COVER PROBE W GEL 5X96 (DRAPES) ×2 IMPLANT
ELECT REM PT RETURN 9FT ADLT (ELECTROSURGICAL) ×2
ELECTRODE REM PT RTRN 9FT ADLT (ELECTROSURGICAL) ×1 IMPLANT
GLOVE SURG SS PI 7.5 STRL IVOR (GLOVE) ×6 IMPLANT
GOWN STRL REUS W/ TWL LRG LVL3 (GOWN DISPOSABLE) ×2 IMPLANT
GOWN STRL REUS W/ TWL XL LVL3 (GOWN DISPOSABLE) ×1 IMPLANT
GOWN STRL REUS W/TWL LRG LVL3 (GOWN DISPOSABLE) ×2
GOWN STRL REUS W/TWL XL LVL3 (GOWN DISPOSABLE) ×1
KIT BASIN OR (CUSTOM PROCEDURE TRAY) ×2 IMPLANT
KIT TURNOVER KIT B (KITS) ×2 IMPLANT
NDL 18GX1X1/2 (RX/OR ONLY) (NEEDLE) ×1 IMPLANT
NEEDLE 18GX1X1/2 (RX/OR ONLY) (NEEDLE) ×2 IMPLANT
NS IRRIG 1000ML POUR BTL (IV SOLUTION) ×2 IMPLANT
PACK CV ACCESS (CUSTOM PROCEDURE TRAY) ×2 IMPLANT
PAD ARMBOARD 7.5X6 YLW CONV (MISCELLANEOUS) ×4 IMPLANT
SURGIFLO W/THROMBIN 8M KIT (HEMOSTASIS) ×1 IMPLANT
SUT PROLENE 6 0 CC (SUTURE) ×2 IMPLANT
SUT SILK 2 0 (SUTURE) ×1
SUT SILK 2-0 18XBRD TIE 12 (SUTURE) IMPLANT
SUT SILK 3 0 (SUTURE) ×1
SUT SILK 3-0 18XBRD TIE 12 (SUTURE) IMPLANT
SUT VIC AB 3-0 SH 27 (SUTURE) ×1
SUT VIC AB 3-0 SH 27X BRD (SUTURE) ×1 IMPLANT
SUT VICRYL 4-0 PS2 18IN ABS (SUTURE) ×2 IMPLANT
SYR 30ML LL (SYRINGE) ×2 IMPLANT
TOWEL GREEN STERILE (TOWEL DISPOSABLE) ×2 IMPLANT
UNDERPAD 30X36 HEAVY ABSORB (UNDERPADS AND DIAPERS) ×2 IMPLANT
WATER STERILE IRR 1000ML POUR (IV SOLUTION) ×2 IMPLANT

## 2022-07-09 NOTE — Discharge Instructions (Signed)
[facility]  Discharge Instructions  AV Fistula or Graft Surgery for Dialysis Access  Please refer to the following instructions for your post-procedure care. Your surgeon or physician assistant will discuss any changes with you.  Activity  You may drive the day following your surgery, if you are comfortable and no longer taking prescription pain medication. Resume full activity as the soreness in your incision resolves.  Bathing/Showering  You may shower after you go home. Keep your incision dry for 48 hours. Do not soak in a bathtub, hot tub, or swim until the incision heals completely. You may not shower if you have a hemodialysis catheter.  Incision Care  Clean your incision with mild soap and water after 48 hours. Pat the area dry with a clean towel. You do not need a bandage unless otherwise instructed. Do not apply any ointments or creams to your incision. You may have skin glue on your incision. Do not peel it off. It will come off on its own in about one week. Your [name] may swell a bit after surgery. To reduce swelling use pillows to elevate your [name] so it is above your heart. Your doctor will tell you if you need to lightly wrap your [name] with an ACE bandage.  Diet  Resume your normal diet. There are not special food restrictions following this procedure. In order to heal from your surgery, it is CRITICAL to get adequate nutrition. Your body requires vitamins, minerals, and protein. Vegetables are the best source of vitamins and minerals. Vegetables also provide the perfect balance of protein. Processed food has little nutritional value, so try to avoid this.  Medications  Resume taking all of your medications. If your incision is causing pain, you may take over-the counter pain relievers such as acetaminophen (Tylenol). If you were prescribed a stronger pain medication, please be aware these medications can cause nausea and constipation. Prevent  nausea by taking the medication with a snack or meal. Avoid constipation by drinking plenty of fluids and eating foods with high amount of fiber, such as fruits, vegetables, and grains.  Do not take Tylenol if you are taking prescription pain medications.  Follow up Your surgeon may want to see you in the office following your access surgery. If so, this will be arranged at the time of your surgery.  Please call us immediately for any of the following conditions:  Increased pain, redness, drainage (pus) from your incision site Fever of 101 degrees or higher Severe or worsening pain at your incision site Hand pain or numbness.  Reduce your risk of vascular disease:  Stop smoking. If you would like help, call [facility] at 1-800-QUIT-NOW 502 148 7145) or [place] at [place] your cholesterol Maintain a desired weight Control your diabetes Keep your blood pressure down  Dialysis  It will take several weeks to several months for your new dialysis access to be ready for use. Your surgeon will determine when it is okay to use it. Your nephrologist will continue to direct your dialysis. You can continue to use your Permcath until your new access is ready for use.   07/09/2022 Colleen Colleen [name] [id number] June 04, 1951  Surgeon(s): Colleen Mitchell, MD  Procedure(s): Colleen Colleen [name]  ARTERIOVENOUS FISTULA  Colleen [name]   May stick graft immediately   May stick graft on designated area only:   X Do not stick Colleen AV fistula for 12 weeks    If you have any questions, please call the  office at [phone].

## 2022-07-09 NOTE — Op Note (Signed)
    Patient name: Colleen [name] MRN: [id number] DOB: [birth year] Sex: female  07/09/2022 Pre-operative Diagnosis: ESRD Post-operative diagnosis:  Same Surgeon:  Annamarie Major Assistants:  Ivin Booty, PA Procedure:   #1:  left basilic vein fistula   #2:  ligation of left cephalic vein fistula Anesthesia:  General Blood Loss:  minimal Specimens:  none  Findings:  excellent basilic vein  Indications:  This is a 71 year old female in need of need dialysis access  Procedure:  The patient was identified in the holding area and taken to [place] [facility]  The patient was then placed supine on the table. general anesthesia was administered.  The patient was prepped and draped in the usual sterile fashion.  A time out was called and antibiotics were administered.  A PA was necessary to expedite the procedure and assist with technical details  Ultrasound was used to evaluate the left basilic vein which was widely patent and easily compressible.  I began by making an oblique incision just proximal to the antecubital crease.  I first dissected out the brachial artery which was a disease-free 4 mm artery.  I then exposed the remnant of the cephalic vein fistula which had a palpable pulse.  This was encircled with a vessel loop.  With the PAs help providing exposure via suction and retraction, I identified the basilic vein.  This was fully mobilized.  Side branches were ligated between silk ties.  It was marked for orientation and then ligated distally.  The vein distended nicely with heparinized saline to about 4 to 5 mm.  I then occluded the brachial artery with vascular clamps and used a #11 blade to make an arteriotomy which was extended longitudinally with Potts scissors.  The basilic vein was cut the appropriate length and spatulated to fit the size of the arteriotomy.  A running anastomosis was created with 6-0 Prolene.  The PA provided help by following the suture.  Appropriate flushing maneuvers were  performed and the anastomosis was completed.  I inspected the course of the vein to make sure there were no kinks or remaining branches.  The nerve was protected.  There was a palpable thrill within the fistula and excellent radial and ulnar Doppler signals.  I then ligated the remnant of the cephalic vein fistula between 2 silk 2 oh ties.  The wound was then irrigated.  Hemostasis was achieved.  The incision was closed with 2 layers of Vicryl.  Steri-Strips were placed.  There were no immediate complications.  Disposition: To PACU stable.   Theotis Burrow, M.D., [facility] [place] Office: [phone] Pager:  [phone]

## 2022-07-09 NOTE — Anesthesia Preprocedure Evaluation (Signed)
Anesthesia Evaluation  Patient identified by MRN, date of birth, ID band Patient awake    Reviewed: Allergy & Precautions, H&P , NPO status , Patient's Chart, lab work & pertinent test results  Airway Mallampati: II   Neck ROM: full    Dental   Pulmonary asthma , sleep apnea ,    breath sounds clear to auscultation       Cardiovascular hypertension, + Valvular Problems/Murmurs  Rhythm:regular Rate:Normal     Neuro/Psych    GI/Hepatic   Endo/Other  diabetesMorbid obesity  Renal/GU ESRFRenal diseasestones     Musculoskeletal  (+) Arthritis ,   Abdominal   Peds  Hematology  (+) Blood dyscrasia, anemia ,   Anesthesia Other Findings   Reproductive/Obstetrics                             Anesthesia Physical Anesthesia Plan  ASA: 3  Anesthesia Plan: General   Post-op Pain Management:    Induction: Intravenous  PONV Risk Score and Plan: 3 and Ondansetron, Dexamethasone and Treatment may vary due to age or medical condition  Airway Management Planned: LMA  Additional Equipment:   Intra-op Plan:   Post-operative Plan: Extubation in OR  Informed Consent: I have reviewed the patients History and Physical, chart, labs and discussed the procedure including the risks, benefits and alternatives for the proposed anesthesia with the patient or authorized representative who has indicated his/her understanding and acceptance.     Dental advisory given  Plan Discussed with: CRNA, Anesthesiologist and Surgeon  Anesthesia Plan Comments:         Anesthesia Quick Evaluation

## 2022-07-09 NOTE — Transfer of Care (Signed)
Immediate Anesthesia Transfer of Care Note  Patient: Edmund Hilda  Procedure(s) Performed: LEFT ARM  ARTERIOVENOUS FISTULA  CREATION (Left: Arm Upper)  Patient Location: PACU  Anesthesia Type:General  Level of Consciousness: awake, alert  and patient cooperative  Airway & Oxygen Therapy: Patient Spontanous Breathing and Patient connected to nasal cannula oxygen  Post-op Assessment: Report given to RN and Post -op Vital signs reviewed and stable  Post vital signs: Reviewed and stable  Last Vitals:  Vitals Value Taken Time  BP 134/66   Temp    Pulse 48 07/09/22 1038  Resp 0 07/09/22 1039  SpO2 94 % 07/09/22 1038  Vitals shown include unvalidated device data.  Last Pain:  Vitals:   07/09/22 0724  TempSrc:   PainSc: 5          Complications: No notable events documented.

## 2022-07-09 NOTE — Anesthesia Procedure Notes (Signed)
Procedure Name: LMA Insertion Date/Time: 07/09/2022 8:53 AM  Performed by: Lowella Dell, CRNAPre-anesthesia Checklist: Patient identified, Emergency Drugs available, Suction available and Patient being monitored Patient Re-evaluated:Patient Re-evaluated prior to induction Oxygen Delivery Method: Circle System Utilized Preoxygenation: Pre-oxygenation with 100% oxygen Induction Type: IV induction Ventilation: Mask ventilation without difficulty LMA: LMA with gastric port inserted LMA Size: 5.0 Number of attempts: 2 (LMA 4 placed with suboptimal seal, immediately switched out for Supreme 5) Airway Equipment and Method: Bite block Placement Confirmation: positive ETCO2 Tube secured with: Tape Dental Injury: Teeth and Oropharynx as per pre-operative assessment

## 2022-07-09 NOTE — Progress Notes (Signed)
Pt admitted to rm 2 from PACU. CHG wipe given. Initiated tele. VSS. Oriented pt to the unit. Call bell within reach.   Lavenia Atlas, RN

## 2022-07-09 NOTE — Interval H&P Note (Signed)
History and Physical Interval Note:  07/09/2022 8:31 AM  Colleen [name]  has presented today for surgery, with the diagnosis of CHRONIC KIDNEY DISEASE STAGE 5.  The various methods of treatment have been discussed with the patient and family. After consideration of risks, benefits and other options for treatment, the patient has consented to  Procedure(s): LEFT BASILIC VEIN FISTULA VERSUS ARTERIOVENOUS GRAFT CREATION (Left) as a surgical intervention.  The patient's history has been reviewed, patient examined, no change in status, stable for surgery.  I have reviewed the patient's chart and labs.  Questions were answered to the patient's satisfaction.     Annamarie Major

## 2022-07-09 NOTE — Anesthesia Postprocedure Evaluation (Signed)
Anesthesia Post Note  Patient: Colleen [name]  Procedure(s) Performed: LEFT ARM  ARTERIOVENOUS FISTULA  CREATION (Left: Arm Upper)     Patient location during evaluation: PACU Anesthesia Type: General Level of consciousness: awake and alert Pain management: pain level controlled Vital Signs Assessment: post-procedure vital signs reviewed and stable Respiratory status: spontaneous breathing, nonlabored ventilation, respiratory function stable and patient connected to nasal cannula oxygen Cardiovascular status: blood pressure returned to baseline and stable Postop Assessment: no apparent nausea or vomiting Anesthetic complications: no   No notable events documented.  Last Vitals:  Vitals:   07/09/22 1210 07/09/22 1240  BP: 120/60 121/65  Pulse: (!) 43 (!) 43  Resp: 11 13  Temp:    SpO2: 95% 96%    Last Pain:  Vitals:   07/09/22 1210  TempSrc:   PainSc: Asleep                 Colleen [name] S

## 2022-07-10 ENCOUNTER — Encounter (HOSPITAL_COMMUNITY): Payer: Self-pay | Admitting: Surgery

## 2022-07-10 DIAGNOSIS — T82590A Other mechanical complication of surgically created arteriovenous fistula, initial encounter: Secondary | ICD-10-CM | POA: Diagnosis not present

## 2022-07-10 NOTE — Discharge Summary (Signed)
Discharge Summary    Kaelie Henigan 1951/08/13 71 y.o. female  [id number]  Admission Date: 07/09/2022  Discharge Date: 07/10/2022   Physician: Serafina Mitchell, MD  Admission Diagnosis: ESRD (end stage renal disease) on dialysis [facility]) [N18.6, Z99.2]   HPI:   This is a 71 y.o. female who is here today for new access.  She has undergone the following procedures: 05/24/2021: Left brachiocephalic fistula Carlis Abbott) 16/05/629: Superficialization and branch ligation of left brachiocephalic fistula Carlis Abbott)   She is right-handed, however has not great use of her right hand secondary to shoulder surgery and would prefer access again in her left arm.  She does not have a pacemaker.   The patient suffers from diabetes.  He is medically managed for hypertension.  He has hypercholesterolemia.  He is status post gastric bypass in 2007 for morbid obesity.  He has a history of rheumatic valvular heart disease  Hospital Course:  The patient was admitted to the hospital and taken to the operating room on 07/09/2022 and underwent: Procedure:   #1:  left basilic vein fistula                       #2:  ligation of left cephalic vein fistula    The pt tolerated the procedure well and was transported to the PACU in good condition.   Pt was admitted overnight for observation.  POD 1, pt doing well.  Fistula with thrill at incision.  Some tingling in fingers but not felt to be due to steal as she has easily palpable left radial pulse.    She is discharged home with f/u in 6 weeks.    CBC    Component Value Date/Time   WBC 7.1 03/04/2021 0100   RBC 4.08 03/04/2021 0100   HGB 11.6 (L) 07/09/2022 0732   HCT 34.0 (L) 07/09/2022 0732   PLT 216 03/04/2021 0100   MCV 93.6 03/04/2021 0100   MCH 28.9 03/04/2021 0100   MCHC 30.9 03/04/2021 0100   RDW 13.9 03/04/2021 0100   LYMPHSABS 1.3 03/04/2021 0100   MONOABS 0.6 03/04/2021 0100   EOSABS 0.3 03/04/2021 0100   BASOSABS 0.1 03/04/2021 0100     BMET    Component Value Date/Time   NA 144 07/09/2022 0732   K 4.3 07/09/2022 0732   CL 109 07/09/2022 0732   CO2 21 (L) 03/04/2021 0100   GLUCOSE 129 (H) 07/09/2022 0732   BUN 58 (H) 07/09/2022 0732   CREATININE 4.80 (H) 07/09/2022 0732   CALCIUM 8.8 (L) 03/04/2021 0100   GFRNONAA 13 (L) 03/04/2021 0100   GFRAA 22 (L) 09/16/2017 1710      Discharge Instructions     Call MD for:   Complete by: As directed    Pain in arm or hand that is unrelieved with pain medication, weakness, numbness, tingling, coldness or discoloration of forearm or hand   Call MD for:  redness, tenderness, or signs of infection (pain, swelling, redness, odor or green/yellow discharge around incision site)   Complete by: As directed    Call MD for:  severe uncontrolled pain   Complete by: As directed    Call MD for:  temperature >100.4   Complete by: As directed    Diet - low sodium heart healthy   Complete by: As directed    Discharge patient   Complete by: As directed    Discharge disposition: 01-Home or Self Care   Discharge patient date: 07/10/2022  Discharge wound care:   Complete by: As directed    Keep dressings on for 24 hours. You may then remove and wash with mild soap and water, pat dry. Do not soak in bathtub. Steri strips will eventually curl up and fall off or can be gently removed in 7-10 days   Driving Restrictions   Complete by: As directed    No driving while taking pain medication   Increase activity slowly   Complete by: As directed    Lifting restrictions   Complete by: As directed    No heavy lifting, pushing, pulling > 10 lbs for 4 weeks       Discharge Diagnosis:  ESRD (end stage renal disease) on dialysis ([place]) [N18.6, Z99.2]  Secondary Diagnosis: Patient Active Problem List   Diagnosis Date Noted   ESRD (end stage renal disease) on dialysis ([place]) 07/09/2022   Polyneuropathy due to type 2 diabetes mellitus ([place]) 11/12/2021   CKD (chronic kidney disease) stage  5, GFR less than 15 ml/min ([place]) 04/23/2021   Abnormal weight gain 04/01/2021   Colon cancer screening 04/01/2021   Family history of malignant neoplasm of gastrointestinal tract 04/01/2021   Iron deficiency anemia 04/01/2021   Stage 4 chronic kidney disease ([place]) 03/01/2020   Pain due to onychomycosis of toenails of both feet 07/05/2019   Diabetes mellitus without complication ([place]) 45/02/8881   Essential hypertension 06/29/2019   Carpal tunnel syndrome 02/06/2016   S/P shoulder replacement 11/22/2015   Past Medical History:  Diagnosis Date   Anemia    Arthritis    Asthma    Bradycardia    Chronic renal failure, stage 4 (severe) ([place]) 2017   no dialysis at this time, Stage 5 (as of 8/0/03)   Complication of anesthesia    during anesthesia heart rate is low    Enlarged heart    Sees Dr. Einar Gip   Heart murmur    due to rheumatic fever - Sees Dr Einar Gip   Hemorrhoid    History of kidney stones    History of rheumatic fever    with a heart murmer   Hypertension    Ovarian cyst 1985   removed   Pneumonia    Pre-diabetes    no meds   Psoriasis    Sleep apnea    does not use Cpap (after weight loss no longer needed)     Allergies as of 07/10/2022       Reactions   Nifedipine Swelling   Other    Surgery Glue caused blisters   Penicillins Rash        Medication List     TAKE these medications    albuterol 108 (90 Base) MCG/ACT inhaler Commonly known as: VENTOLIN HFA Inhale 1-2 puffs into the lungs every 6 (six) hours as needed for shortness of breath or wheezing.   Alpha-Lipoic Acid 300 MG Caps Take 300 mg by mouth daily.   amLODipine 10 MG tablet Commonly known as: NORVASC Take 10 mg by mouth daily.   calcitRIOL 0.5 MCG capsule Commonly known as: ROCALTROL Take 0.5 mcg by mouth daily.   calcium elemental as carbonate 400 MG chewable tablet Commonly known as: BARIATRIC TUMS ULTRA Chew 1,000 mg by mouth 3 (three) times daily.   ferrous sulfate 325 (65  FE) MG tablet Take 325 mg by mouth daily with breakfast.   folic acid 1 MG tablet Commonly known as: FOLVITE Take 1 mg by mouth daily.   GlucoCom Blood Glucose Monitor Kerrin Mo  Use daily to check blood sugar.   labetalol 200 MG tablet Commonly known as: NORMODYNE Take 200 mg by mouth 2 (two) times daily.   multivitamin with minerals Tabs tablet Take 1 tablet by mouth daily. Pro renal D   nitroGLYCERIN 0.4 MG SL tablet Commonly known as: NITROSTAT Place 0.4 mg under the tongue every 5 (five) minutes as needed for chest pain.   OMEGA-3-6-9 PO Take 1 capsule by mouth daily.   oxyCODONE-acetaminophen 5-325 MG tablet Commonly known as: Percocet Take 1 tablet by mouth every 4 (four) hours as needed for severe pain.   sevelamer carbonate 800 MG tablet Commonly known as: RENVELA Take 800 mg by mouth 3 (three) times daily with meals.   Taltz 80 MG/ML Soaj Generic drug: Ixekizumab Inject 80 mg into the skin.   triamcinolone cream 0.1 % Commonly known as: KENALOG Apply 1 Application topically daily.   vitamin B-12 1000 MCG tablet Commonly known as: CYANOCOBALAMIN Take 1,000 mcg by mouth every Monday, Wednesday, and Friday.   vitamin C 500 MG tablet Commonly known as: ASCORBIC ACID Take 500 mg by mouth daily.   Vitamin D3 250 MCG (10000 UT) capsule Take 10,000 Units by mouth once a week.               Discharge Care Instructions  (From admission, onward)           Start     Ordered   07/09/22 0000  Discharge wound care:       Comments: Keep dressings on for 24 hours. You may then remove and wash with mild soap and water, pat dry. Do not soak in bathtub. Steri strips will eventually curl up and fall off or can be gently removed in 7-10 days   07/09/22 1039             Instructions: [facility] Discharge Instructions AV Fistula or Graft Surgery for Dialysis Access  Please refer to the following instructions for your  post-procedure care. Your surgeon or physician assistant will discuss any changes with you.  Activity  You may drive the day following your surgery, if you are comfortable and no longer taking prescription pain medication. Resume full activity as the soreness in your incision resolves.  Bathing/Showering  You may shower after you go home. Keep your incision dry for 48 hours. Do not soak in a bathtub, hot tub, or swim until the incision heals completely. You may not shower if you have a hemodialysis catheter.  Incision Care  Clean your incision with mild soap and water after 48 hours. Pat the area dry with a clean towel. You do not need a bandage unless otherwise instructed. Do not apply any ointments or creams to your incision. You may have skin glue on your incision. Do not peel it off. It will come off on its own in about one week. Your arm may swell a bit after surgery. To reduce swelling use pillows to elevate your arm so it is above your heart. Your doctor will tell you if you need to lightly wrap your arm with an ACE bandage.  Diet  Resume your normal diet. There are not special food restrictions following this procedure. In order to heal from your surgery, it is CRITICAL to get adequate nutrition. Your body requires vitamins, minerals, and protein. Vegetables are the best source of vitamins and minerals. Vegetables also provide the perfect balance of protein. Processed food has little nutritional value, so try  to avoid this.  Medications  Resume taking all of your medications. If your incision is causing pain, you may take over-the counter pain relievers such as acetaminophen (Tylenol). If you were prescribed a stronger pain medication, please be aware these medications can cause nausea and constipation. Prevent nausea by taking the medication with a snack or meal. Avoid constipation by drinking plenty of fluids and eating foods with high amount of fiber, such as fruits, vegetables, and  grains. Do not take Tylenol if you are taking prescription pain medications.  Follow up Your surgeon may want to see you in the office following your access surgery. If so, this will be arranged at the time of your surgery.  Please call us immediately for any of the following conditions:  Increased pain, redness, drainage (pus) from your incision site Fever of 101 degrees or higher Severe or worsening pain at your incision site Hand pain or numbness.  Reduce your risk of vascular disease:  Stop smoking. If you would like help, call [facility] at 1-800-QUIT-NOW 847-837-2945) or Dorris at [place] your cholesterol Maintain a desired weight Control your diabetes Keep your blood pressure down  Dialysis  It will take several weeks to several months for your new dialysis access to be ready for use. Your surgeon will determine when it is OK to use it. Your nephrologist will continue to direct your dialysis. You can continue to use your Permcath until your new access is ready for use.   07/10/2022 Raneen Jaffer [id number] 03-12-1951  Surgeon(s): Serafina Mitchell, MD  Procedure(s): LEFT ARM  ARTERIOVENOUS FISTULA  CREATION  x Do not stick fistula for 12 weeks    If you have any questions, please call the office at [phone].  Prescriptions given: Roxicet #8 No Refill  Disposition: home  Patient's condition: is Good  Follow up: 1. [facility] in 6 weeks with dialysis duplex   Leontine Locket, PA-C [facility] [phone] 07/10/2022  10:06 AM

## 2022-07-10 NOTE — Plan of Care (Signed)

## 2022-07-10 NOTE — Progress Notes (Addendum)
  Postoperative hemodialysis access     Date of Surgery:  07/09/2022 Surgeon: Trula Slade  Subjective:  says she had some tingling in the left hand but its better.  Says she had some right arm swelling but that is better  PHYSICAL EXAMINATION:  Vitals:   07/09/22 2342 07/10/22 0416  BP: (!) 122/47 (!) 135/55  Pulse: 60 67  Resp: 16 17  Temp: 98.9 F (37.2 C) 98.6 F (37 C)  SpO2: 93% 94%    Incision is clean and dry Sensation in digits is intact;  There is  Thrill  The fistula is palpable at the anastomosis The right and left radial pulse is easily palpable Right arm is soft and non tender; appears symmetrical with the left arm   ASSESSMENT/PLAN:  Colleen Gray is a 71 y.o. year old female who is s/p left 1st stage BVT and ligation of left BC AVF.  - fistula is patent with thrill at incision -pt does not have evidence of steal sx -f/u with [facility] in 6 weeks to check maturation of AVF    Colleen Locket, PA-C [facility] [phone]   I agree with the above.  I have seen and evaluated the patient.  She does have some tingling in his fingers but I do not feel it is a steal syndrome but likely secondary to nerve irritation.  She is scheduled to go home today and will follow-up in the office  Colleen Gray

## 2022-07-16 ENCOUNTER — Other Ambulatory Visit: Payer: Self-pay | Admitting: *Deleted

## 2022-07-16 DIAGNOSIS — N185 Chronic kidney disease, stage 5: Secondary | ICD-10-CM

## 2022-08-11 ENCOUNTER — Ambulatory Visit (HOSPITAL_COMMUNITY)
Admission: RE | Admit: 2022-08-11 | Discharge: 2022-08-11 | Disposition: A | Discharge status: Home / Self Care | Payer: Medicare Other | Source: Ambulatory Visit | Attending: Surgery | Admitting: Surgery

## 2022-08-11 DIAGNOSIS — N185 Chronic kidney disease, stage 5: Secondary | ICD-10-CM | POA: Insufficient documentation

## 2022-08-18 ENCOUNTER — Encounter: Payer: Self-pay | Admitting: Surgery

## 2022-08-18 ENCOUNTER — Ambulatory Visit (INDEPENDENT_AMBULATORY_CARE_PROVIDER_SITE_OTHER): Payer: Medicare Other | Admitting: Surgery

## 2022-08-18 VITALS — BP 155/82 | HR 58 | Temp 97.9°F | Resp 20 | Ht 63.0 in | Wt 252.0 lb

## 2022-08-18 DIAGNOSIS — N186 End stage renal disease: Secondary | ICD-10-CM

## 2022-08-18 DIAGNOSIS — Z992 Dependence on renal dialysis: Secondary | ICD-10-CM

## 2022-08-18 NOTE — Progress Notes (Signed)
Patient name: Colleen Gray MRN: [id number] DOB: 02-May-1951 Sex: female  REASON FOR VISIT:     Post op  HISTORY OF PRESENT ILLNESS:   Colleen Gray is a 71 y.o. female who has previously undergone a left brachiocephalic fistula with superficialization by Dr. Carlis Abbott.  On 07/09/2022, she underwent a first stage left basilic vein fistula.  She is back today for follow-up  The patient suffers from diabetes.  He is medically managed for hypertension.  He has hypercholesterolemia.  He is status post gastric bypass in 2007 for morbid obesity.  He has a history of rheumatic valvular heart disease  CURRENT MEDICATIONS:    Current Outpatient Medications  Medication Sig Dispense Refill   albuterol (VENTOLIN HFA) 108 (90 Base) MCG/ACT inhaler Inhale 1-2 puffs into the lungs every 6 (six) hours as needed for shortness of breath or wheezing.     Alpha-Lipoic Acid 300 MG CAPS Take 300 mg by mouth daily.     amLODipine (NORVASC) 10 MG tablet Take 10 mg by mouth daily.     Blood Glucose Monitoring Suppl (GLUCOCOM BLOOD GLUCOSE MONITOR) DEVI Use daily to check blood sugar.     calcitRIOL (ROCALTROL) 0.5 MCG capsule Take 0.5 mcg by mouth daily.     calcium elemental as carbonate (BARIATRIC TUMS ULTRA) 400 MG chewable tablet Chew 1,000 mg by mouth 3 (three) times daily.     Cholecalciferol (VITAMIN D3) 250 MCG (10000 UT) capsule Take 10,000 Units by mouth once a week.     ferrous sulfate 325 (65 FE) MG tablet Take 325 mg by mouth daily with breakfast.     folic acid (FOLVITE) 1 MG tablet Take 1 mg by mouth daily.     furosemide (LASIX) 40 MG tablet Take 40 mg by mouth daily.     Ixekizumab (TALTZ) 80 MG/ML SOAJ Inject 80 mg into the skin.     labetalol (NORMODYNE) 200 MG tablet Take 200 mg by mouth 2 (two) times daily.     Multiple Vitamin (MULTIVITAMIN WITH MINERALS) TABS tablet Take 1 tablet by mouth daily. Pro renal D     nitroGLYCERIN (NITROSTAT) 0.4 MG SL  tablet Place 0.4 mg under the tongue every 5 (five) minutes as needed for chest pain.     Omega 3-6-9 Fatty Acids (OMEGA-3-6-9 PO) Take 1 capsule by mouth daily.     sevelamer carbonate (RENVELA) 800 MG tablet Take 800 mg by mouth 3 (three) times daily with meals.     triamcinolone cream (KENALOG) 0.1 % Apply 1 Application topically daily.     vitamin B-12 (CYANOCOBALAMIN) 1000 MCG tablet Take 1,000 mcg by mouth every Monday, Wednesday, and Friday.     vitamin C (ASCORBIC ACID) 500 MG tablet Take 500 mg by mouth daily.     No current facility-administered medications for this visit.    REVIEW OF SYSTEMS:   '[X]'$  denotes positive finding, '[ ]'$  denotes negative finding Cardiac  Comments:  Chest pain or chest pressure:    Shortness of breath upon exertion:    Short of breath when lying flat:    Irregular heart rhythm:    Constitutional    Fever or chills:      PHYSICAL EXAM:   Vitals:   08/18/22 1350  BP: (!) 155/82  Pulse: (!) 58  Resp: 20  Temp: 97.9 F (36.6 C)  SpO2: 95%  Weight: 252 lb (114.3 kg)  Height: '5\' 3"'$  (1.6 m)    GENERAL: The patient is a well-nourished  female, in no acute distress. The vital signs are documented above. CARDIOVASCULAR: There is a regular rate and rhythm. PULMONARY: Non-labored respirations Excellent thrill within left basilic vein fistula.  There is a hematoma at her anastomosis  STUDIES:   Fistula duplex: OUTFLOW VEINPSV (cm/s)Diameter (cm)Depth (cm)Describe  +------------+----------+-------------+----------+--------+  Prox UA        111        0.90        2.76             +------------+----------+-------------+----------+--------+  Mid UA         157        0.78        2.80             +------------+----------+-------------+----------+--------+  Dist UA        175        0.81        2.42             +------------+----------+-------------+----------+--------+  AC Fossa       387        0.53        1.74              +------------+----------+-------------+----------+--------+   MEDICAL ISSUES:   End-stage renal disease: I discussed with the patient that she has a excellent caliber basilic vein.  We should proceed with elevation and branch ligation.  She also has a hematoma at her anastomosis that I would like to evacuate.  I will try to get this done in the immediate future.  She has had a reaction to Dermabond in the past and so I will place Steri-Strips over the wound  Annamarie Major, IV, MD, FACS [facility] [phone] Pager [phone]

## 2022-08-26 ENCOUNTER — Other Ambulatory Visit: Payer: Self-pay

## 2022-08-26 DIAGNOSIS — N186 End stage renal disease: Secondary | ICD-10-CM

## 2022-09-18 ENCOUNTER — Encounter (HOSPITAL_COMMUNITY): Payer: Self-pay | Admitting: Surgery

## 2022-09-18 ENCOUNTER — Other Ambulatory Visit: Payer: Self-pay

## 2022-09-18 NOTE — Progress Notes (Signed)
Ms Colleen Gray denies chest pain or shortness of breath. Patient denies having any s/s of Covid in her household, also denies any known exposure to Covid.   Ms. Colleen Gray PCP is Dr. Kevan Ny, cardiologist is Dr. Einar Gip.

## 2022-09-19 ENCOUNTER — Ambulatory Visit (HOSPITAL_COMMUNITY): Payer: Medicare Other | Admitting: Certified Registered Nurse Anesthetist

## 2022-09-19 ENCOUNTER — Ambulatory Visit (HOSPITAL_BASED_OUTPATIENT_CLINIC_OR_DEPARTMENT_OTHER): Payer: Medicare Other | Admitting: Certified Registered Nurse Anesthetist

## 2022-09-19 ENCOUNTER — Observation Stay (HOSPITAL_COMMUNITY)
Admission: RE | Admit: 2022-09-19 | Discharge: 2022-09-20 | Disposition: A | Discharge status: Home / Self Care | Payer: Medicare Other | Source: Ambulatory Visit | Attending: Surgery | Admitting: Surgery

## 2022-09-19 ENCOUNTER — Encounter (HOSPITAL_COMMUNITY)
Admission: RE | Disposition: A | Discharge status: Home / Self Care | Payer: Self-pay | Source: Ambulatory Visit | Attending: Surgery

## 2022-09-19 ENCOUNTER — Other Ambulatory Visit: Payer: Self-pay

## 2022-09-19 DIAGNOSIS — Z79899 Other long term (current) drug therapy: Secondary | ICD-10-CM | POA: Insufficient documentation

## 2022-09-19 DIAGNOSIS — E1122 Type 2 diabetes mellitus with diabetic chronic kidney disease: Secondary | ICD-10-CM

## 2022-09-19 DIAGNOSIS — N186 End stage renal disease: Secondary | ICD-10-CM

## 2022-09-19 DIAGNOSIS — E119 Type 2 diabetes mellitus without complications: Secondary | ICD-10-CM | POA: Insufficient documentation

## 2022-09-19 DIAGNOSIS — J449 Chronic obstructive pulmonary disease, unspecified: Secondary | ICD-10-CM

## 2022-09-19 DIAGNOSIS — D631 Anemia in chronic kidney disease: Secondary | ICD-10-CM

## 2022-09-19 DIAGNOSIS — E7800 Pure hypercholesterolemia, unspecified: Secondary | ICD-10-CM | POA: Diagnosis not present

## 2022-09-19 DIAGNOSIS — I120 Hypertensive chronic kidney disease with stage 5 chronic kidney disease or end stage renal disease: Secondary | ICD-10-CM | POA: Diagnosis not present

## 2022-09-19 DIAGNOSIS — N185 Chronic kidney disease, stage 5: Secondary | ICD-10-CM

## 2022-09-19 HISTORY — PX: BASCILIC VEIN TRANSPOSITION: SHX5742

## 2022-09-19 LAB — POCT I-STAT, CHEM 8
BUN: 108 mg/dL — ABNORMAL HIGH (ref 8–23)
Calcium, Ion: 1.1 mmol/L — ABNORMAL LOW (ref 1.15–1.40)
Chloride: 107 mmol/L (ref 98–111)
Creatinine, Ser: 5.7 mg/dL — ABNORMAL HIGH (ref 0.44–1.00)
Glucose, Bld: 121 mg/dL — ABNORMAL HIGH (ref 70–99)
HCT: 36 % (ref 36.0–46.0)
Hemoglobin: 12.2 g/dL (ref 12.0–15.0)
Potassium: 3.8 mmol/L (ref 3.5–5.1)
Sodium: 142 mmol/L (ref 135–145)
TCO2: 22 mmol/L (ref 22–32)

## 2022-09-19 LAB — GLUCOSE, CAPILLARY
Glucose-Capillary: 172 mg/dL — ABNORMAL HIGH (ref 70–99)
Glucose-Capillary: 115 mg/dL — ABNORMAL HIGH (ref 70–99)

## 2022-09-19 LAB — HEMOGLOBIN A1C
Hgb A1c MFr Bld: 5.8 % — ABNORMAL HIGH (ref 4.8–5.6)
Mean Plasma Glucose: 119.76 mg/dL

## 2022-09-19 SURGERY — TRANSPOSITION, VEIN, BASILIC
Anesthesia: Monitor Anesthesia Care | Site: Arm Upper | Laterality: Left

## 2022-09-19 MED ORDER — MIDAZOLAM HCL 2 MG/2ML IJ SOLN
INTRAMUSCULAR | Status: AC
  Filled 2022-09-19: qty 2

## 2022-09-19 MED ORDER — FENTANYL CITRATE (PF) 250 MCG/5ML IJ SOLN
INTRAMUSCULAR | Status: AC
  Filled 2022-09-19: qty 5

## 2022-09-19 MED ORDER — FENTANYL CITRATE (PF) 250 MCG/5ML IJ SOLN
INTRAMUSCULAR | Status: DC | PRN
  Administered 2022-09-19 (×2): 25 ug via INTRAVENOUS

## 2022-09-19 MED ORDER — HYDRALAZINE HCL 20 MG/ML IJ SOLN: 5.0000 mg | INTRAMUSCULAR | Status: DC | PRN

## 2022-09-19 MED ORDER — ORAL CARE MOUTH RINSE: 15.0000 mL | Freq: Once | OROMUCOSAL | Status: AC

## 2022-09-19 MED ORDER — NITROGLYCERIN 0.4 MG SL SUBL: 0.4000 mg | SUBLINGUAL_TABLET | SUBLINGUAL | Status: DC | PRN

## 2022-09-19 MED ORDER — ACETAMINOPHEN 10 MG/ML IV SOLN: 1000.0000 mg | Freq: Once | INTRAVENOUS | Status: DC | PRN

## 2022-09-19 MED ORDER — HEPARIN 6000 UNIT IRRIGATION SOLUTION
Status: AC
  Filled 2022-09-19: qty 500

## 2022-09-19 MED ORDER — FENTANYL CITRATE (PF) 100 MCG/2ML IJ SOLN: 25.0000 ug | INTRAMUSCULAR | Status: DC | PRN

## 2022-09-19 MED ORDER — HYDROCODONE-ACETAMINOPHEN 5-325 MG PO TABS: 1.0000 | ORAL_TABLET | Freq: Four times a day (QID) | ORAL | 0 refills | Status: DC | PRN

## 2022-09-19 MED ORDER — CHLORHEXIDINE GLUCONATE 4 % EX LIQD: 60.0000 mL | Freq: Once | CUTANEOUS | Status: DC

## 2022-09-19 MED ORDER — CALCITRIOL 0.5 MCG PO CAPS
0.5000 ug | ORAL_CAPSULE | Freq: Every day | ORAL | Status: DC
  Administered 2022-09-20: 0.5 ug via ORAL
  Filled 2022-09-19 (×2): qty 1

## 2022-09-19 MED ORDER — ACETAMINOPHEN 325 MG PO TABS: 325.0000 mg | ORAL_TABLET | ORAL | Status: DC | PRN

## 2022-09-19 MED ORDER — SODIUM CHLORIDE 0.9 % IV SOLN: INTRAVENOUS | Status: DC

## 2022-09-19 MED ORDER — ONDANSETRON HCL 4 MG/2ML IJ SOLN: 4.0000 mg | Freq: Four times a day (QID) | INTRAMUSCULAR | Status: DC | PRN

## 2022-09-19 MED ORDER — INSULIN ASPART 100 UNIT/ML IJ SOLN
0.0000 [IU] | Freq: Three times a day (TID) | INTRAMUSCULAR | Status: DC
  Administered 2022-09-19: 3 [IU] via SUBCUTANEOUS
  Administered 2022-09-20: 2 [IU] via SUBCUTANEOUS

## 2022-09-19 MED ORDER — CHLORHEXIDINE GLUCONATE 0.12 % MT SOLN
15.0000 mL | Freq: Once | OROMUCOSAL | Status: AC
  Administered 2022-09-19: 15 mL via OROMUCOSAL
  Filled 2022-09-19: qty 15

## 2022-09-19 MED ORDER — LIDOCAINE-EPINEPHRINE (PF) 1 %-1:200000 IJ SOLN
INTRAMUSCULAR | Status: AC
  Filled 2022-09-19: qty 30

## 2022-09-19 MED ORDER — PHENYLEPHRINE HCL-NACL 20-0.9 MG/250ML-% IV SOLN
INTRAVENOUS | Status: DC | PRN
  Administered 2022-09-19: 25 ug/min via INTRAVENOUS

## 2022-09-19 MED ORDER — BUPIVACAINE HCL (PF) 0.5 % IJ SOLN
INTRAMUSCULAR | Status: AC
  Filled 2022-09-19: qty 30

## 2022-09-19 MED ORDER — MIDAZOLAM HCL 2 MG/2ML IJ SOLN
INTRAMUSCULAR | Status: DC | PRN
  Administered 2022-09-19: 1 mg via INTRAVENOUS

## 2022-09-19 MED ORDER — LABETALOL HCL 5 MG/ML IV SOLN: 10.0000 mg | INTRAVENOUS | Status: DC | PRN

## 2022-09-19 MED ORDER — LABETALOL HCL 200 MG PO TABS
200.0000 mg | ORAL_TABLET | Freq: Two times a day (BID) | ORAL | Status: DC
  Administered 2022-09-19 – 2022-09-20 (×2): 200 mg via ORAL
  Filled 2022-09-19 (×2): qty 1

## 2022-09-19 MED ORDER — FENTANYL CITRATE (PF) 100 MCG/2ML IJ SOLN: 50.0000 ug | Freq: Once | INTRAMUSCULAR | Status: AC

## 2022-09-19 MED ORDER — PROPOFOL 500 MG/50ML IV EMUL
INTRAVENOUS | Status: DC | PRN
  Administered 2022-09-19: 75 ug/kg/min via INTRAVENOUS

## 2022-09-19 MED ORDER — HYDROCODONE-ACETAMINOPHEN 5-325 MG PO TABS
1.0000 | ORAL_TABLET | Freq: Four times a day (QID) | ORAL | Status: DC | PRN
  Administered 2022-09-19: 1 via ORAL
  Filled 2022-09-19 (×2): qty 1

## 2022-09-19 MED ORDER — ALBUTEROL SULFATE (2.5 MG/3ML) 0.083% IN NEBU: 3.0000 mL | INHALATION_SOLUTION | Freq: Four times a day (QID) | RESPIRATORY_TRACT | Status: DC | PRN

## 2022-09-19 MED ORDER — MEPIVACAINE HCL (PF) 2 % IJ SOLN
INTRAMUSCULAR | Status: DC | PRN
  Administered 2022-09-19: 20 mL

## 2022-09-19 MED ORDER — ROPIVACAINE HCL 5 MG/ML IJ SOLN
INTRAMUSCULAR | Status: DC | PRN
  Administered 2022-09-19: 10 mL

## 2022-09-19 MED ORDER — BUPIVACAINE LIPOSOME 1.3 % IJ SUSP
INTRAMUSCULAR | Status: AC
  Filled 2022-09-19: qty 20

## 2022-09-19 MED ORDER — ALUM & MAG HYDROXIDE-SIMETH 200-200-20 MG/5ML PO SUSP: 15.0000 mL | ORAL | Status: DC | PRN

## 2022-09-19 MED ORDER — EPHEDRINE SULFATE-NACL 50-0.9 MG/10ML-% IV SOSY
PREFILLED_SYRINGE | INTRAVENOUS | Status: DC | PRN
  Administered 2022-09-19: 10 mg via INTRAVENOUS
  Administered 2022-09-19 (×3): 5 mg via INTRAVENOUS

## 2022-09-19 MED ORDER — VANCOMYCIN HCL 1500 MG/300ML IV SOLN
1500.0000 mg | INTRAVENOUS | Status: AC
  Administered 2022-09-19: 1500 mg via INTRAVENOUS
  Filled 2022-09-19: qty 300

## 2022-09-19 MED ORDER — HEPARIN 6000 UNIT IRRIGATION SOLUTION
Status: DC | PRN
  Administered 2022-09-19: 1

## 2022-09-19 MED ORDER — ACETAMINOPHEN 325 MG RE SUPP: 325.0000 mg | RECTAL | Status: DC | PRN

## 2022-09-19 MED ORDER — AMLODIPINE BESYLATE 10 MG PO TABS
10.0000 mg | ORAL_TABLET | Freq: Every day | ORAL | Status: DC
  Administered 2022-09-20: 10 mg via ORAL
  Filled 2022-09-19: qty 1

## 2022-09-19 MED ORDER — SURGIFLO WITH THROMBIN (HEMOSTATIC MATRIX KIT) OPTIME
TOPICAL | Status: DC | PRN
  Administered 2022-09-19: 1 via TOPICAL

## 2022-09-19 MED ORDER — METOPROLOL TARTRATE 5 MG/5ML IV SOLN: 2.0000 mg | INTRAVENOUS | Status: DC | PRN

## 2022-09-19 MED ORDER — 0.9 % SODIUM CHLORIDE (POUR BTL) OPTIME
TOPICAL | Status: DC | PRN
  Administered 2022-09-19: 1000 mL

## 2022-09-19 MED ORDER — PANTOPRAZOLE SODIUM 40 MG PO TBEC
40.0000 mg | DELAYED_RELEASE_TABLET | Freq: Every day | ORAL | Status: DC
  Administered 2022-09-20: 40 mg via ORAL
  Filled 2022-09-19: qty 1

## 2022-09-19 MED ORDER — CALCIUM CARBONATE ANTACID 500 MG PO CHEW
1250.0000 mg | CHEWABLE_TABLET | Freq: Three times a day (TID) | ORAL | Status: DC
  Administered 2022-09-20: 1250 mg via ORAL
  Filled 2022-09-19 (×2): qty 7

## 2022-09-19 MED ORDER — FENTANYL CITRATE (PF) 100 MCG/2ML IJ SOLN
INTRAMUSCULAR | Status: AC
  Administered 2022-09-19: 50 ug via INTRAVENOUS
  Filled 2022-09-19: qty 2

## 2022-09-19 SURGICAL SUPPLY — 46 items
ARMBAND PINK RESTRICT EXTREMIT (MISCELLANEOUS) ×1 IMPLANT
BAG COUNTER SPONGE SURGICOUNT (BAG) ×1 IMPLANT
BENZOIN TINCTURE PRP APPL 2/3 (GAUZE/BANDAGES/DRESSINGS) IMPLANT
BNDG ELASTIC 4X5.8 VLCR STR LF (GAUZE/BANDAGES/DRESSINGS) IMPLANT
BNDG ELASTIC 6X5.8 VLCR STR LF (GAUZE/BANDAGES/DRESSINGS) IMPLANT
CANISTER SUCT 3000ML PPV (MISCELLANEOUS) ×1 IMPLANT
CLIP VESOCCLUDE MED 24/CT (CLIP) IMPLANT
CLIP VESOCCLUDE MED 6/CT (CLIP) IMPLANT
CLIP VESOCCLUDE SM WIDE 24/CT (CLIP) IMPLANT
CLIP VESOCCLUDE SM WIDE 6/CT (CLIP) IMPLANT
CLSR STERI-STRIP ANTIMIC 1/2X4 (GAUZE/BANDAGES/DRESSINGS) IMPLANT
COVER PROBE W GEL 5X96 (DRAPES) ×1 IMPLANT
DRSG COVADERM 4X10 (GAUZE/BANDAGES/DRESSINGS) IMPLANT
ELECT REM PT RETURN 9FT ADLT (ELECTROSURGICAL) ×1
ELECTRODE REM PT RTRN 9FT ADLT (ELECTROSURGICAL) ×1 IMPLANT
GLOVE BIO SURGEON STRL SZ 6.5 (GLOVE) IMPLANT
GLOVE BIOGEL PI IND STRL 6.5 (GLOVE) IMPLANT
GLOVE SURG SS PI 7.5 STRL IVOR (GLOVE) ×3 IMPLANT
GOWN STRL REUS W/ TWL LRG LVL3 (GOWN DISPOSABLE) ×2 IMPLANT
GOWN STRL REUS W/ TWL XL LVL3 (GOWN DISPOSABLE) ×1 IMPLANT
GOWN STRL REUS W/TWL LRG LVL3 (GOWN DISPOSABLE) ×2
GOWN STRL REUS W/TWL XL LVL3 (GOWN DISPOSABLE) ×1
HEMOSTAT SNOW SURGICEL 2X4 (HEMOSTASIS) IMPLANT
KIT BASIN OR (CUSTOM PROCEDURE TRAY) ×1 IMPLANT
KIT TURNOVER KIT B (KITS) ×1 IMPLANT
MARKER SKIN DUAL TIP RULER LAB (MISCELLANEOUS) IMPLANT
NDL 18GX1X1/2 (RX/OR ONLY) (NEEDLE) ×1 IMPLANT
NEEDLE 18GX1X1/2 (RX/OR ONLY) (NEEDLE) ×1 IMPLANT
NS IRRIG 1000ML POUR BTL (IV SOLUTION) ×1 IMPLANT
PACK CV ACCESS (CUSTOM PROCEDURE TRAY) ×1 IMPLANT
PAD ARMBOARD 7.5X6 YLW CONV (MISCELLANEOUS) ×2 IMPLANT
SLING ARM FOAM STRAP LRG (SOFTGOODS) IMPLANT
SLING ARM FOAM STRAP MED (SOFTGOODS) IMPLANT
SPONGE T-LAP 18X18 ~~LOC~~+RFID (SPONGE) IMPLANT
SURGIFLO W/THROMBIN 8M KIT (HEMOSTASIS) IMPLANT
SUT PROLENE 6 0 BV (SUTURE) IMPLANT
SUT SILK 2 0 SH (SUTURE) IMPLANT
SUT SILK 3 0 (SUTURE) ×2
SUT SILK 3-0 18XBRD TIE 12 (SUTURE) IMPLANT
SUT VIC AB 3-0 SH 27 (SUTURE) ×2
SUT VIC AB 3-0 SH 27X BRD (SUTURE) ×1 IMPLANT
SUT VICRYL 4-0 PS2 18IN ABS (SUTURE) ×1 IMPLANT
SYR 30ML LL (SYRINGE) ×1 IMPLANT
TOWEL GREEN STERILE (TOWEL DISPOSABLE) ×1 IMPLANT
UNDERPAD 30X36 HEAVY ABSORB (UNDERPADS AND DIAPERS) ×1 IMPLANT
WATER STERILE IRR 1000ML POUR (IV SOLUTION) ×1 IMPLANT

## 2022-09-19 NOTE — Anesthesia Procedure Notes (Signed)
Procedure Name: MAC Date/Time: 09/19/2022 9:34 AM  Performed by: Colin Benton, CRNAPre-anesthesia Checklist: Patient identified, Emergency Drugs available, Suction available and Patient being monitored Oxygen Delivery Method: Simple face mask Induction Type: IV induction Placement Confirmation: positive ETCO2 Dental Injury: Teeth and Oropharynx as per pre-operative assessment

## 2022-09-19 NOTE — Anesthesia Preprocedure Evaluation (Addendum)
Anesthesia Evaluation  Patient identified by MRN, date of birth, ID band Patient awake    Reviewed: Allergy & Precautions, NPO status , Patient's Chart, lab work & pertinent test results  Airway Mallampati: II       Dental no notable dental hx.    Pulmonary asthma , sleep apnea ,  COPD inhaler,    Pulmonary exam normal        Cardiovascular hypertension, Pt. on medications and Pt. on home beta blockers  Rhythm:Regular Rate:Normal     Neuro/Psych negative neurological ROS  negative psych ROS   GI/Hepatic negative GI ROS, Neg liver ROS,   Endo/Other  diabetes  Renal/GU ESRFRenal disease  negative genitourinary   Musculoskeletal  (+) Arthritis , Osteoarthritis,    Abdominal Normal abdominal exam  (+)   Peds  Hematology  (+) Blood dyscrasia, anemia ,   Anesthesia Other Findings   Reproductive/Obstetrics                             Anesthesia Physical Anesthesia Plan  ASA: 3  Anesthesia Plan: MAC and Regional   Post-op Pain Management: Regional block*   Induction: Intravenous  PONV Risk Score and Plan: 2 and Ondansetron, Dexamethasone, Propofol infusion and Treatment may vary due to age or medical condition  Airway Management Planned: Simple Face Mask, Natural Airway and Nasal Cannula  Additional Equipment: None  Intra-op Plan:   Post-operative Plan:   Informed Consent: I have reviewed the patients History and Physical, chart, labs and discussed the procedure including the risks, benefits and alternatives for the proposed anesthesia with the patient or authorized representative who has indicated his/her understanding and acceptance.     Dental advisory given  Plan Discussed with: CRNA  Anesthesia Plan Comments:         Anesthesia Quick Evaluation

## 2022-09-19 NOTE — Transfer of Care (Signed)
Immediate Anesthesia Transfer of Care Note  Patient: Colleen Gray  Procedure(s) Performed: SECOND STAGE LEFT BASILIC VEIN TRANSPOSITION (Left: Arm Upper)  Patient Location: PACU  Anesthesia Type:MAC combined with regional for post-op pain  Level of Consciousness: awake, alert , oriented and patient cooperative  Airway & Oxygen Therapy: patient spontaneously breathing and connected to facemask oxygen  Post-op Assessment: Report given to RN and Post -op Vital signs reviewed and stable  Post vital signs: Reviewed and stable  Last Vitals:  Vitals Value Taken Time  BP 121/58 09/19/22 1123  Temp    Pulse 49 09/19/22 1125  Resp 12 09/19/22 1125  SpO2 98 % 09/19/22 1125  Vitals shown include unvalidated device data.  Last Pain:  Vitals:   09/19/22 0751  TempSrc: Oral  PainSc:          Complications: No notable events documented.

## 2022-09-19 NOTE — H&P (Signed)
Patient name: Colleen Gray       MRN: [id number]        DOB: 11-04-1951        Sex: female   REASON FOR VISIT:       Post op   HISTORY OF PRESENT ILLNESS:    Colleen Gray is a 71 y.o. female who has previously undergone a left brachiocephalic fistula with superficialization by Dr. Carlis Abbott.  On 07/09/2022, she underwent a first stage left basilic vein fistula.  She is back today for follow-up   The patient suffers from diabetes.  He is medically managed for hypertension.  He has hypercholesterolemia.  He is status post gastric bypass in 2007 for morbid obesity.  He has a history of rheumatic valvular heart disease   CURRENT MEDICATIONS:            Current Outpatient Medications  Medication Sig Dispense Refill   albuterol (VENTOLIN HFA) 108 (90 Base) MCG/ACT inhaler Inhale 1-2 puffs into the lungs every 6 (six) hours as needed for shortness of breath or wheezing.       Alpha-Lipoic Acid 300 MG CAPS Take 300 mg by mouth daily.       amLODipine (NORVASC) 10 MG tablet Take 10 mg by mouth daily.       Blood Glucose Monitoring Suppl (GLUCOCOM BLOOD GLUCOSE MONITOR) DEVI Use daily to check blood sugar.       calcitRIOL (ROCALTROL) 0.5 MCG capsule Take 0.5 mcg by mouth daily.       calcium elemental as carbonate (BARIATRIC TUMS ULTRA) 400 MG chewable tablet Chew 1,000 mg by mouth 3 (three) times daily.       Cholecalciferol (VITAMIN D3) 250 MCG (10000 UT) capsule Take 10,000 Units by mouth once a week.       ferrous sulfate 325 (65 FE) MG tablet Take 325 mg by mouth daily with breakfast.       folic acid (FOLVITE) 1 MG tablet Take 1 mg by mouth daily.       furosemide (LASIX) 40 MG tablet Take 40 mg by mouth daily.       Ixekizumab (TALTZ) 80 MG/ML SOAJ Inject 80 mg into the skin.       labetalol (NORMODYNE) 200 MG tablet Take 200 mg by mouth 2 (two) times daily.       Multiple Vitamin (MULTIVITAMIN WITH MINERALS) TABS tablet Take 1 tablet by mouth daily. Pro renal D        nitroGLYCERIN (NITROSTAT) 0.4 MG SL tablet Place 0.4 mg under the tongue every 5 (five) minutes as needed for chest pain.       Omega 3-6-9 Fatty Acids (OMEGA-3-6-9 PO) Take 1 capsule by mouth daily.       sevelamer carbonate (RENVELA) 800 MG tablet Take 800 mg by mouth 3 (three) times daily with meals.       triamcinolone cream (KENALOG) 0.1 % Apply 1 Application topically daily.       vitamin B-12 (CYANOCOBALAMIN) 1000 MCG tablet Take 1,000 mcg by mouth every Monday, Wednesday, and Friday.       vitamin C (ASCORBIC ACID) 500 MG tablet Take 500 mg by mouth daily.        No current facility-administered medications for this visit.      REVIEW OF SYSTEMS:    '[X]'$  denotes positive finding, '[ ]'$  denotes negative finding Cardiac   Comments:  Chest pain or chest pressure:      Shortness of breath upon  exertion:      Short of breath when lying flat:      Irregular heart rhythm:      Constitutional      Fever or chills:          PHYSICAL EXAM:       Vitals:    08/18/22 1350  BP: (!) 155/82  Pulse: (!) 58  Resp: 20  Temp: 97.9 F (36.6 C)  SpO2: 95%  Weight: 252 lb (114.3 kg)  Height: '5\' 3"'$  (1.6 m)      GENERAL: The patient is a well-nourished female, in no acute distress. The vital signs are documented above. CARDIOVASCULAR: There is a regular rate and rhythm. PULMONARY: Non-labored respirations Excellent thrill within left basilic vein fistula.  There is a hematoma at her anastomosis   STUDIES:    Fistula duplex: OUTFLOW VEINPSV (cm/s)Diameter (cm)Depth (cm)Describe  +------------+----------+-------------+----------+--------+  Prox UA        111        0.90        2.76             +------------+----------+-------------+----------+--------+  Mid UA         157        0.78        2.80             +------------+----------+-------------+----------+--------+  Dist UA        175        0.81        2.42              +------------+----------+-------------+----------+--------+  AC Fossa       387        0.53        1.74             +------------+----------+-------------+----------+--------+    MEDICAL ISSUES:    End-stage renal disease: I discussed with the patient that she has a excellent caliber basilic vein.  We should proceed with elevation and branch ligation.  She also has a hematoma at her anastomosis that I would like to evacuate.  I will try to get this done in the immediate future.  She has had a reaction to Dermabond in the past and so I will place Steri-Strips over the wound   Annamarie Major, IV, MD, FACS [facility] [phone] Pager [phone]

## 2022-09-19 NOTE — Anesthesia Postprocedure Evaluation (Signed)
Anesthesia Post Note  Patient: Colleen Gray  Procedure(s) Performed: SECOND STAGE LEFT BASILIC VEIN TRANSPOSITION (Left: Arm Upper)     Patient location during evaluation: PACU Anesthesia Type: Regional and MAC Level of consciousness: awake and alert Pain management: pain level controlled Vital Signs Assessment: post-procedure vital signs reviewed and stable Respiratory status: spontaneous breathing, nonlabored ventilation, respiratory function stable and patient connected to nasal cannula oxygen Cardiovascular status: stable and blood pressure returned to baseline Postop Assessment: no apparent nausea or vomiting Anesthetic complications: no   No notable events documented.  Last Vitals:  Vitals:   09/19/22 1440 09/19/22 1845  BP: (!) 154/62 (!) 135/59  Pulse: (!) 57 66  Resp: 14 16  Temp: 36.5 C 36.7 C  SpO2: 100% 97%    Last Pain:  Vitals:   09/19/22 1845  TempSrc: Oral  PainSc:                  March Rummage Ryane Konieczny

## 2022-09-19 NOTE — Discharge Instructions (Signed)
° °  [facility] [place] ° °Discharge Instructions ° °AV Fistula or Graft Surgery for Dialysis Access ° °Please refer to the following instructions for your post-procedure care. Your surgeon or physician assistant will discuss any changes with you. ° °Activity ° °You may drive the day following your surgery, if you are comfortable and no longer taking prescription pain medication. Resume full activity as the soreness in your incision resolves. ° °Bathing/Showering ° °You may shower after you go home. Keep your incision dry for 48 hours. Do not soak in a bathtub, hot tub, or swim until the incision heals completely. You may not shower if you have a hemodialysis catheter. ° °Incision Care ° °Clean your incision with mild soap and water after 48 hours. Pat the area dry with a clean towel. You do not need a bandage unless otherwise instructed. Do not apply any ointments or creams to your incision. You may have skin glue on your incision. Do not peel it off. It will come off on its own in about one week. Your arm may swell a bit after surgery. To reduce swelling use pillows to elevate your arm so it is above your heart. Your doctor will tell you if you need to lightly wrap your arm with an ACE bandage. ° °Diet ° °Resume your normal diet. There are not special food restrictions following this procedure. In order to heal from your surgery, it is CRITICAL to get adequate nutrition. Your body requires vitamins, minerals, and protein. Vegetables are the best source of vitamins and minerals. Vegetables also provide the perfect balance of protein. Processed food has little nutritional value, so try to avoid this. ° °Medications ° °Resume taking all of your medications. If your incision is causing pain, you may take over-the counter pain relievers such as acetaminophen (Tylenol). If you were prescribed a stronger pain medication, please be aware these medications can cause nausea and constipation. Prevent  nausea by taking the medication with a snack or meal. Avoid constipation by drinking plenty of fluids and eating foods with high amount of fiber, such as fruits, vegetables, and grains. Do not take Tylenol if you are taking prescription pain medications. ° ° ° ° °Follow up °Your surgeon may want to see you in the office following your access surgery. If so, this will be arranged at the time of your surgery. ° °Please call us immediately for any of the following conditions: ° °Increased pain, redness, drainage (pus) from your incision site °Fever of 101 degrees or higher °Severe or worsening pain at your incision site °Hand pain or numbness. ° °Reduce your risk of vascular disease: ° °Stop smoking. If you would like help, call [facility] at 1-800-QUIT-NOW (1-800-784-8669) or [place] at 336-586-4000 ° °Manage your cholesterol °Maintain a desired weight °Control your diabetes °Keep your blood pressure down ° °Dialysis ° °It will take several weeks to several months for your new dialysis access to be ready for use. Your surgeon will determine when it is OK to use it. Your nephrologist will continue to direct your dialysis. You can continue to use your Permcath until your new access is ready for use. ° °If you have any questions, please call the office at [phone]. ° °

## 2022-09-19 NOTE — Anesthesia Procedure Notes (Signed)
Anesthesia Regional Block: Supraclavicular block   Pre-Anesthetic Checklist: , timeout performed,  Correct Patient, Correct Site, Correct Laterality,  Correct Procedure, Correct Position, site marked,  Risks and benefits discussed,  Surgical consent,  Pre-op evaluation,  At surgeon's request and post-op pain management  Laterality: Left  Prep: Dura Prep       Needles:  Injection technique: Single-shot  Needle Type: Echogenic Stimulator Needle     Needle Length: 5cm  Needle Gauge: 20     Additional Needles:   Procedures:,,,, ultrasound used (permanent image in chart),,    Narrative:  Start time: 09/19/2022 8:46 AM End time: 09/19/2022 8:49 AM Injection made incrementally with aspirations every 5 mL.  Performed by: Personally  Anesthesiologist: Darral Dash, DO  Additional Notes: Patient identified. Risks/Benefits/Options discussed with patient including but not limited to bleeding, infection, nerve damage, failed block, incomplete pain control. Patient expressed understanding and wished to proceed. All questions were answered. Sterile technique was used throughout the entire procedure. Please see nursing notes for vital signs. Aspirated in 5cc intervals with injection for negative confirmation. Patient was given instructions on fall risk and not to get out of bed. All questions and concerns addressed with instructions to call with any issues or inadequate analgesia.

## 2022-09-20 ENCOUNTER — Encounter (HOSPITAL_COMMUNITY): Payer: Self-pay | Admitting: Surgery

## 2022-09-20 DIAGNOSIS — N186 End stage renal disease: Secondary | ICD-10-CM | POA: Diagnosis not present

## 2022-09-20 LAB — GLUCOSE, CAPILLARY
Glucose-Capillary: 117 mg/dL — ABNORMAL HIGH (ref 70–99)
Glucose-Capillary: 148 mg/dL — ABNORMAL HIGH (ref 70–99)

## 2022-09-20 NOTE — Plan of Care (Signed)
  Problem: Education: Goal: Ability to describe self-care measures that may prevent or decrease complications (Diabetes Survival Skills Education) will improve Outcome: Completed/Met Goal: Individualized Educational Video(s) Outcome: Completed/Met   Problem: Coping: Goal: Ability to adjust to condition or change in health will improve Outcome: Completed/Met   Problem: Fluid Volume: Goal: Ability to maintain a balanced intake and output will improve Outcome: Completed/Met   Problem: Health Behavior/Discharge Planning: Goal: Ability to identify and utilize available resources and services will improve Outcome: Completed/Met Goal: Ability to manage health-related needs will improve Outcome: Completed/Met   Problem: Metabolic: Goal: Ability to maintain appropriate glucose levels will improve Outcome: Completed/Met   Problem: Nutritional: Goal: Maintenance of adequate nutrition will improve Outcome: Completed/Met Goal: Progress toward achieving an optimal weight will improve Outcome: Completed/Met   Problem: Skin Integrity: Goal: Risk for impaired skin integrity will decrease Outcome: Completed/Met   Problem: Tissue Perfusion: Goal: Adequacy of tissue perfusion will improve Outcome: Completed/Met   

## 2022-09-20 NOTE — Progress Notes (Signed)
DISCHARGE NOTE HOME Edmund Hilda to be discharged Home per MD order. Discussed prescriptions and follow up appointments with the patient. Medication list explained in detail. Patient verbalized understanding.  Skin clean, dry and intact without evidence of skin break down, no evidence of skin tears noted. IV catheter discontinued intact. Site without signs and symptoms of complications. Dressing and pressure applied. Pt denies pain at the site currently. No complaints noted.  Patient free of lines, drains, and wounds.   An After Visit Summary (AVS) was printed and given to the patient. Patient escorted via wheelchair, and discharged home via private auto.  Anastasio Auerbach, RN

## 2022-09-20 NOTE — Progress Notes (Signed)
   VASCULAR SURGERY ASSESSMENT & PLAN:   POD 1 LEFT 2ND STAGE BVT: The patient has an excellent thrill in her left basilic vein transposition.  He has a palpable left radial pulse.  Her incisions look fine.  I changed her dressing.  Okay for discharge today.  SUBJECTIVE:   No specific complaints.  PHYSICAL EXAM:   Vitals:   09/19/22 1345 09/19/22 1440 09/19/22 1845 09/20/22 0538  BP: 134/62 (!) 154/62 (!) 135/59 (!) 139/58  Pulse: (!) 48 (!) 57 66 63  Resp: '12 14 16 17  '$ Temp: 97.6 F (36.4 C) 97.7 F (36.5 C) 98.1 F (36.7 C) 98.2 F (36.8 C)  TempSrc:  Oral Oral   SpO2: 99% 100% 97% 98%  Weight:      Height:       Excellent thrill in left upper arm fistula. Palpable left radial pulse. Incisions look fine.  LABS:   Lab Results  Component Value Date   WBC 7.1 03/04/2021   HGB 12.2 09/19/2022   HCT 36.0 09/19/2022   MCV 93.6 03/04/2021   PLT 216 03/04/2021   Lab Results  Component Value Date   CREATININE 5.70 (H) 09/19/2022   Lab Results  Component Value Date   INR 0.98 10/13/2016   CBG (last 3)  Recent Labs    09/19/22 1659 09/19/22 2137 09/20/22 0113  GLUCAP 172* 115* 117*    PROBLEM LIST:    Principal Problem:   ESRD (end stage renal disease) (HCC)   CURRENT MEDS:    amLODipine  10 mg Oral Daily   calcitRIOL  0.5 mcg Oral Daily   calcium carbonate  1,250 mg Oral TID WC   insulin aspart  0-15 Units Subcutaneous TID WC   labetalol  200 mg Oral BID   pantoprazole  40 mg Oral Daily    Deitra Mayo Office: [phone] 09/20/2022

## 2022-09-21 NOTE — Op Note (Signed)
    Patient name: Colleen Gray MRN: [id number] DOB: 1951-11-19 Sex: female  09/19/2022 Pre-operative Diagnosis: ESRD Post-operative diagnosis:  Same Surgeon:  Annamarie Major Assistants:  Arlee Muslim Procedure:   Second stage left basilic vein fistula creation (revision with elevation and branch ligation) Anesthesia: Regional Blood Loss: Minimal Specimens: None  Findings: Healthy appearing basilic vein measuring 7 to 8 mm  Indications: This is a 71 year old female who has previously undergone for stage basilic vein fistula creation.  By ultrasound, her vein has developed nicely.  She is here today for the second stage procedure.  Procedure:  The patient was identified in the holding area and taken to [place] [facility]  The patient was then placed supine on the table. IV sedation anesthesia was administered.  The patient was prepped and draped in the usual sterile fashion.  A time out was called and antibiotics were administered.  A PA was necessary to expedite the procedure and assist with technical details.  The PA provided suction, retraction to facilitate exposure.  He also help with wound closure.  Ultrasound was used to mark the course of the basilic vein in the upper arm.  This was a least a 7 mm vein throughout.  2 longitudinal incisions were made along the medial aspect of the left upper arm.  Through these incisions, the basilic vein was isolated.  It was mobilized from the antecubital crease up to the axilla.  Multiple side branches required division between silk ties.  Once the vein was fully mobilized, it was marked for orientation with a pen.  Baby Gregory clamps were then placed in the axilla and at the antecubital crease on the vein.  The vein was then divided near the antecubital crease.  I then used a curved Gore tunneler to create a tunnel in the upper arm.  Uterine dressing clamp was used to pull the vein through the tunnel, making sure to maintain proper orientation.  Next, a  end to end anastomosis was created with running 6-0 Prolene.  Prior to completion the appropriate flushing maneuvers were performed and the anastomosis was completed.  There was an excellent thrill within the foot.  The wound was then irrigated.  Hemostasis was achieved.  The incisions were closed by reapproximating the deep tissue with 3-0 Vicryl and the skin with 4-0 Vicryl followed by Dermabond.  There were no immediate complications.   Disposition: To PACU stable.   Theotis Burrow, M.D., [facility] [place] Office: [phone] Pager:  [phone]

## 2022-09-22 NOTE — Discharge Summary (Signed)
Discharge Summary  Patient ID: Colleen Gray [id number] 71 y.o. 03-Sep-1951  Admit date: 09/19/2022  Discharge date and time: 09/20/2022 11:49 AM   Admitting Physician: Serafina Mitchell, MD   Discharge Physician: Dr. Scot Dock  Admission Diagnoses: ESRD (end stage renal disease) [facility]) [N18.6]  Discharge Diagnoses: same  Admission Condition: fair  Discharged Condition: fair  Indication for Admission: observation  Hospital Course: Colleen Gray is a 71 year old female who was brought in as an outpatient for left second stage basilic vein transposition by Dr. Trula Slade on 09/19/2022.  She tolerated the procedure well and was kept in observation overnight.  POD #1 she was discharged home in stable condition.  She will follow-up in office in about 2 to 3 weeks for incision check.  It should also be noted that she was prescribed 1 to 2 days of narcotic pain medication for continued postoperative pain control.  Consults: None  Treatments: surgery: Left arm second stage basilic vein transposition by Dr. Trula Slade on 09/19/2022  Discharge Exam: See progress note 09/20/22 Vitals:   09/20/22 0538 09/20/22 0932  BP: (!) 139/58 131/60  Pulse: 63 66  Resp: 17 17  Temp: 98.2 F (36.8 C) 98.4 F (36.9 C)  SpO2: 98% 98%     Disposition: Discharge disposition: 01-Home or Self Care       Patient Instructions:  Allergies as of 09/20/2022       Reactions   Nifedipine Swelling   Other    Surgery Glue caused blisters   Penicillins Rash        Medication List     TAKE these medications    albuterol 108 (90 Base) MCG/ACT inhaler Commonly known as: VENTOLIN HFA Inhale 1-2 puffs into the lungs every 6 (six) hours as needed for shortness of breath or wheezing.   Alpha-Lipoic Acid 300 MG Caps Take 300 mg by mouth daily.   amLODipine 10 MG tablet Commonly known as: NORVASC Take 10 mg by mouth daily.   ascorbic acid 500 MG tablet Commonly known as: VITAMIN C Take 500 mg  by mouth daily.   calcitRIOL 0.5 MCG capsule Commonly known as: ROCALTROL Take 0.5 mcg by mouth daily.   calcium elemental as carbonate 400 MG chewable tablet Commonly known as: BARIATRIC TUMS ULTRA Chew 1,000 mg by mouth 3 (three) times daily.   cyanocobalamin 1000 MCG tablet Commonly known as: VITAMIN B12 Take 1,000 mcg by mouth every Monday, Wednesday, and Friday.   ferrous sulfate 325 (65 FE) MG tablet Take 325 mg by mouth daily with breakfast.   folic acid 1 MG tablet Commonly known as: FOLVITE Take 1 mg by mouth daily.   furosemide 40 MG tablet Commonly known as: LASIX Take 40 mg by mouth daily.   GlucoCom Blood Glucose Monitor Devi Use daily to check blood sugar.   HYDROcodone-acetaminophen 5-325 MG tablet Commonly known as: Norco Take 1 tablet by mouth every 6 (six) hours as needed for moderate pain.   labetalol 200 MG tablet Commonly known as: NORMODYNE Take 200 mg by mouth 2 (two) times daily.   multivitamin with minerals Tabs tablet Take 1 tablet by mouth daily. Pro renal + D   nitroGLYCERIN 0.4 MG SL tablet Commonly known as: NITROSTAT Place 0.4 mg under the tongue every 5 (five) minutes as needed for chest pain.   OMEGA-3-6-9 PO Take 1 capsule by mouth daily.   OVER THE COUNTER MEDICATION Take 1 capsule by mouth daily. Fruit and Vegetable Blend   sevelamer carbonate 800  MG tablet Commonly known as: RENVELA Take 800 mg by mouth 3 (three) times daily with meals.   Taltz 80 MG/ML Soaj Generic drug: Ixekizumab Inject 80 mg into the skin.   triamcinolone cream 0.1 % Commonly known as: KENALOG Apply 1 Application topically daily as needed (irritation).   Vitamin D3 250 MCG (10000 UT) capsule Take 10,000 Units by mouth every Tuesday.       Activity: activity as tolerated Diet: renal diet Wound Care: keep wound clean and dry  Follow-up with [facility] in 3 weeks.  Signed: Dagoberto Ligas, PA-C 09/22/2022 8:18 AM [facility] Office: [phone]

## 2022-09-30 ENCOUNTER — Other Ambulatory Visit: Payer: Self-pay | Admitting: Orthopaedic Surgery

## 2022-09-30 DIAGNOSIS — Z01818 Encounter for other preprocedural examination: Secondary | ICD-10-CM

## 2022-10-13 ENCOUNTER — Other Ambulatory Visit: Payer: Self-pay | Admitting: Gastroenterology

## 2022-10-13 ENCOUNTER — Ambulatory Visit (INDEPENDENT_AMBULATORY_CARE_PROVIDER_SITE_OTHER): Payer: Medicare Other | Admitting: Physician Assistant

## 2022-10-13 VITALS — BP 143/74 | HR 56 | Temp 98.1°F | Resp 16 | Ht 63.0 in | Wt 255.0 lb

## 2022-10-13 DIAGNOSIS — N184 Chronic kidney disease, stage 4 (severe): Secondary | ICD-10-CM

## 2022-10-13 NOTE — Progress Notes (Signed)
POST OPERATIVE OFFICE NOTE    CC:  F/u for surgery  HPI:  This is a 71 y.o. female who is s/p left arm second stage basilic vein fistula by Dr. Trula Slade on 09/19/2022.  She believes the incisions are healing well.  She denies signs or symptoms of steal syndrome in her left hand.  She is not yet on hemodialysis.  CKD is managed by Dr. Marval Regal.  Allergies  Allergen Reactions   Nifedipine Swelling   Other     Surgery Glue caused blisters   Penicillins Rash    Current Outpatient Medications  Medication Sig Dispense Refill   albuterol (VENTOLIN HFA) 108 (90 Base) MCG/ACT inhaler Inhale 1-2 puffs into the lungs every 6 (six) hours as needed for shortness of breath or wheezing.     Alpha-Lipoic Acid 300 MG CAPS Take 300 mg by mouth daily.     amLODipine (NORVASC) 10 MG tablet Take 10 mg by mouth daily.     Blood Glucose Monitoring Suppl (GLUCOCOM BLOOD GLUCOSE MONITOR) DEVI Use daily to check blood sugar.     calcitRIOL (ROCALTROL) 0.5 MCG capsule Take 0.5 mcg by mouth daily.     calcium elemental as carbonate (BARIATRIC TUMS ULTRA) 400 MG chewable tablet Chew 1,000 mg by mouth 3 (three) times daily.     Cholecalciferol (VITAMIN D3) 250 MCG (10000 UT) capsule Take 10,000 Units by mouth every Tuesday.     ferrous sulfate 325 (65 FE) MG tablet Take 325 mg by mouth daily with breakfast.     folic acid (FOLVITE) 1 MG tablet Take 1 mg by mouth daily.     furosemide (LASIX) 40 MG tablet Take 40 mg by mouth daily.     labetalol (NORMODYNE) 200 MG tablet Take 200 mg by mouth 2 (two) times daily.     Multiple Vitamin (MULTIVITAMIN WITH MINERALS) TABS tablet Take 1 tablet by mouth daily. Pro renal + D     nitroGLYCERIN (NITROSTAT) 0.4 MG SL tablet Place 0.4 mg under the tongue every 5 (five) minutes as needed for chest pain.     Omega 3-6-9 Fatty Acids (OMEGA-3-6-9 PO) Take 1 capsule by mouth daily.     OVER THE COUNTER MEDICATION Take 1 capsule by mouth daily. Fruit and Vegetable Blend      sevelamer carbonate (RENVELA) 800 MG tablet Take 800 mg by mouth 3 (three) times daily with meals.     triamcinolone cream (KENALOG) 0.1 % Apply 1 Application topically daily as needed (irritation).     vitamin B-12 (CYANOCOBALAMIN) 1000 MCG tablet Take 1,000 mcg by mouth every Monday, Wednesday, and Friday.     vitamin C (ASCORBIC ACID) 500 MG tablet Take 500 mg by mouth daily.     HYDROcodone-acetaminophen (NORCO) 5-325 MG tablet Take 1 tablet by mouth every 6 (six) hours as needed for moderate pain. (Patient not taking: Reported on 10/13/2022) 20 tablet 0   Ixekizumab (TALTZ) 80 MG/ML SOAJ Inject 80 mg into the skin. (Patient not taking: Reported on 10/13/2022)     No current facility-administered medications for this visit.     ROS:  See HPI  Physical Exam:  Vitals:   10/13/22 1226  BP: (!) 143/74  Pulse: (!) 56  Resp: 16  Temp: 98.1 F (36.7 C)  TempSrc: Temporal  SpO2: 99%  Weight: 255 lb (115.7 kg)  Height: '5\' 3"'$  (1.6 m)    Incision:  L arm incisions c/d/i Extremities:  palpable L radial pulse; palpable thrill over the course of  the fistula  Assessment/Plan:  This is a 71 y.o. female who is s/p: Left second stage basilic vein fistula transposition by Dr. Trula Slade  -Left arm basilic vein fistula with palpable thrill.  No signs of steal syndrome in left hand -Okay to use left arm basilic vein fistula for HD starting 10/20/2022 -Patient will follow-up on an as-needed basis   Dagoberto Ligas, PA-C [facility] [phone]  Clinic MD:  Trula Slade

## 2022-10-29 ENCOUNTER — Ambulatory Visit
Admission: RE | Admit: 2022-10-29 | Discharge: 2022-10-29 | Disposition: A | Discharge status: Home / Self Care | Payer: Medicare Other | Source: Ambulatory Visit | Attending: Orthopaedic Surgery | Admitting: Orthopaedic Surgery

## 2022-10-29 DIAGNOSIS — Z01818 Encounter for other preprocedural examination: Secondary | ICD-10-CM

## 2022-11-06 ENCOUNTER — Encounter: Payer: Self-pay | Admitting: Internal Medicine

## 2022-11-06 ENCOUNTER — Ambulatory Visit: Payer: Medicare Other | Admitting: Internal Medicine

## 2022-11-06 VITALS — BP 145/70 | HR 58 | Ht 64.0 in | Wt 255.0 lb

## 2022-11-06 DIAGNOSIS — I10 Essential (primary) hypertension: Secondary | ICD-10-CM

## 2022-11-06 DIAGNOSIS — Z01818 Encounter for other preprocedural examination: Secondary | ICD-10-CM | POA: Insufficient documentation

## 2022-11-06 DIAGNOSIS — N185 Chronic kidney disease, stage 5: Secondary | ICD-10-CM

## 2022-11-06 DIAGNOSIS — Z01810 Encounter for preprocedural cardiovascular examination: Secondary | ICD-10-CM

## 2022-11-06 NOTE — Progress Notes (Signed)
Primary Physician/Referring:  Rogers Blocker, MD  Patient ID: Colleen Gray, female    DOB: 03/06/1951, 71 y.o.   MRN: [id number]  Chief Complaint  Patient presents with   New Patient (Initial Visit)   Medical Clearance   Hypertension   HPI:    Colleen Gray  is a 71 y.o. female with hypertension, end-stage renal disease on dialysis, and heart murmur who is here to establish care with cardiology.  Patient is looking to have shoulder surgery and cardiac risk stratification has been requested.  Patient states that she has had a heart murmur for a very long time and nothing has needed to be done about it.  She does not recall the last time she has had an echocardiogram of her heart.  Patient will require a stress test prior to clearing her for shoulder surgery as she is unable to reach 4 METS in her daily life.  Her blood pressure is elevated today, however, patient states the lift was not working and she had to take the stairs to get in here and that was very taxing for her.  She denies chest pain, shortness of breath, palpitations, diaphoresis, syncope, orthopnea, PND.  She states that she cannot use her arm well due to a prior shoulder surgery and she really needs to get this fixed.  Otherwise patient has no complaints or concerns at this time.  Past Medical History:  Diagnosis Date   Anemia    Arthritis    Asthma    Bradycardia    Chronic renal failure, stage 4 (severe) ([place]) 2017   no dialysis at this time, Stage 5 (as of 07/26/15)   Complication of anesthesia    during anesthesia heart rate is low    Enlarged heart    Sees Dr. Einar Gip   Heart murmur    due to rheumatic fever - Sees Dr Einar Gip   Hemorrhoid    History of kidney stones    History of rheumatic fever    with a heart murmer   Hypertension    Ovarian cyst 1985   removed   Pneumonia    Pre-diabetes    no meds   Psoriasis    Sleep apnea    does not use Cpap (after weight loss no longer needed)   Past Surgical  History:  Procedure Laterality Date   AV FISTULA PLACEMENT Left 05/24/2021   Procedure: LEFT ARM BRACHIOCEPHALIC ARTERIOVENOUS (AV) FISTULA CREATION;  Surgeon: Marty Heck, MD;  Location: [place];  Service: Vascular;  Laterality: Left;   [place] Left 07/09/2022   Procedure: LEFT ARM  ARTERIOVENOUS FISTULA  CREATION;  Surgeon: Serafina Mitchell, MD;  Location: [place];  Service: Vascular;  Laterality: Left;   [place] Left 09/19/2022   Procedure: SECOND STAGE LEFT BASILIC VEIN TRANSPOSITION;  Surgeon: Serafina Mitchell, MD;  Location: [place];  Service: Vascular;  Laterality: Left;   BUNIONECTOMY WITH HAMMERTOE RECONSTRUCTION Bilateral 1980's; 1999; 2009    left; right; left 'w/plates and screws"   COLONOSCOPY  2016   [facility] of colon cancer   DILATION AND CURETTAGE OF UTERUS  early 1980's   for missed AB   FISTULA SUPERFICIALIZATION Left 09/27/2021   Procedure: LEFT ARM ARTERIOVENOUS FISTULA REVISION AND SUPERFICIALIZATION;  Surgeon: Marty Heck, MD;  Location: [place];  Service: Vascular;  Laterality: Left;   JOINT REPLACEMENT     LAPAROSCOPIC GASTRIC BYPASS  2007   LAPAROSCOPIC OVARIAN CYSTECTOMY Right 1985  REVERSE SHOULDER ARTHROPLASTY Left 11/22/2015   Procedure: LEFT REVERSE SHOULDER ARTHROPLASTY;  Surgeon: Justice Britain, MD;  Location: [place];  Service: Orthopedics;  Laterality: Left;   ROTATOR CUFF REPAIR Right 2006   Family History  Problem Relation Age of Onset   Colon cancer Mother 20   Diabetes Mother    Hypertension Mother    Diabetes Father    Hypertension Father    Colon cancer Maternal Grandmother 106   Diabetes Paternal Grandmother    Diabetes Paternal Grandfather    Breast cancer Neg Hx     Social History   Tobacco Use   Smoking status: Never   Smokeless tobacco: Never  Substance Use Topics   Alcohol use: No    Alcohol/week: 0.0 standard drinks of alcohol   Marital Status: Widowed  ROS  Review of Systems   Musculoskeletal:  Positive for arthritis, back pain and joint pain.   Objective  Blood pressure (!) 145/70, pulse (!) 58, height '5\' 4"'$  (1.626 m), weight 255 lb (115.7 kg), last menstrual period 12/23/2007, SpO2 98 %. Body mass index is 43.77 kg/m.     11/06/2022   12:54 PM 10/13/2022   12:26 PM 09/20/2022    9:32 AM  Vitals with BMI  Height '5\' 4"'$  '5\' 3"'$    Weight 255 lbs 255 lbs   BMI 78.93 81.01   Systolic 751 025 852  Diastolic 70 74 60  Pulse 58 56 66     Physical Exam Vitals reviewed.  HENT:     Head: Normocephalic and atraumatic.  Cardiovascular:     Rate and Rhythm: Normal rate and regular rhythm.     Pulses: Normal pulses.     Heart sounds: Murmur heard.  Pulmonary:     Effort: Pulmonary effort is normal.     Breath sounds: Normal breath sounds.  Abdominal:     General: Bowel sounds are normal.  Musculoskeletal:     Right lower leg: Edema present.     Left lower leg: Edema present.  Skin:    General: Skin is warm and dry.  Neurological:     Mental Status: She is alert.     Medications and allergies   Allergies  Allergen Reactions   Nifedipine Swelling   Other     Surgery Glue caused blisters   Penicillins Rash     Medication list after today's encounter   Current Outpatient Medications:    albuterol (VENTOLIN HFA) 108 (90 Base) MCG/ACT inhaler, Inhale 1-2 puffs into the lungs every 6 (six) hours as needed for shortness of breath or wheezing., Disp: , Rfl:    Alpha-Lipoic Acid 300 MG CAPS, Take 300 mg by mouth daily., Disp: , Rfl:    amLODipine (NORVASC) 10 MG tablet, Take 10 mg by mouth daily., Disp: , Rfl:    Blood Glucose Monitoring Suppl (GLUCOCOM BLOOD GLUCOSE MONITOR) DEVI, Use daily to check blood sugar., Disp: , Rfl:    calcitRIOL (ROCALTROL) 0.5 MCG capsule, Take 0.5 mcg by mouth daily., Disp: , Rfl:    calcium elemental as carbonate (BARIATRIC TUMS ULTRA) 400 MG chewable tablet, Chew 1,000 mg by mouth 3 (three) times daily., Disp: , Rfl:     Cholecalciferol (VITAMIN D3) 250 MCG (10000 UT) capsule, Take 10,000 Units by mouth every Tuesday., Disp: , Rfl:    ferrous sulfate 325 (65 FE) MG tablet, Take 325 mg by mouth daily with breakfast., Disp: , Rfl:    folic acid (FOLVITE) 1 MG tablet, Take 1 mg by  mouth daily., Disp: , Rfl:    furosemide (LASIX) 40 MG tablet, Take 40 mg by mouth daily., Disp: , Rfl:    labetalol (NORMODYNE) 200 MG tablet, Take 200 mg by mouth 2 (two) times daily., Disp: , Rfl:    Multiple Vitamin (MULTIVITAMIN WITH MINERALS) TABS tablet, Take 1 tablet by mouth daily. Pro renal + D, Disp: , Rfl:    nitroGLYCERIN (NITROSTAT) 0.4 MG SL tablet, Place 0.4 mg under the tongue every 5 (five) minutes as needed for chest pain., Disp: , Rfl:    Omega 3-6-9 Fatty Acids (OMEGA-3-6-9 PO), Take 1 capsule by mouth daily., Disp: , Rfl:    OVER THE COUNTER MEDICATION, Take 1 capsule by mouth daily. Fruit and Vegetable Blend, Disp: , Rfl:    sevelamer carbonate (RENVELA) 800 MG tablet, Take 800 mg by mouth 3 (three) times daily with meals., Disp: , Rfl:    triamcinolone cream (KENALOG) 0.1 %, Apply 1 Application topically daily as needed (irritation)., Disp: , Rfl:    vitamin B-12 (CYANOCOBALAMIN) 1000 MCG tablet, Take 1,000 mcg by mouth every Monday, Wednesday, and Friday., Disp: , Rfl:    vitamin C (ASCORBIC ACID) 500 MG tablet, Take 500 mg by mouth daily., Disp: , Rfl:    HYDROcodone-acetaminophen (NORCO) 5-325 MG tablet, Take 1 tablet by mouth every 6 (six) hours as needed for moderate pain. (Patient not taking: Reported on 10/13/2022), Disp: 20 tablet, Rfl: 0   Ixekizumab (TALTZ) 80 MG/ML SOAJ, Inject 80 mg into the skin. (Patient not taking: Reported on 10/13/2022), Disp: , Rfl:   Laboratory examination:   Lab Results  Component Value Date   NA 142 09/19/2022   K 3.8 09/19/2022   CO2 21 (L) 03/04/2021   GLUCOSE 121 (H) 09/19/2022   BUN 108 (H) 09/19/2022   CREATININE 5.70 (H) 09/19/2022   CALCIUM 8.8 (L) 03/04/2021    GFRNONAA 13 (L) 03/04/2021       Latest Ref Rng & Units 09/19/2022    8:20 AM 07/09/2022    7:32 AM 09/27/2021    8:45 AM  CMP  Glucose 70 - 99 mg/dL 121  129  120   BUN 8 - 23 mg/dL 108  58  66   Creatinine 0.44 - 1.00 mg/dL 5.70  4.80  4.20   Sodium 135 - 145 mmol/L 142  144  145   Potassium 3.5 - 5.1 mmol/L 3.8  4.3  3.9   Chloride 98 - 111 mmol/L 107  109  113       Latest Ref Rng & Units 09/19/2022    8:20 AM 07/09/2022    7:32 AM 09/27/2021    8:45 AM  CBC  Hemoglobin 12.0 - 15.0 g/dL 12.2  11.6  13.6   Hematocrit 36.0 - 46.0 % 36.0  34.0  40.0     Lipid Panel No results for input(s): "CHOL", "TRIG", "LDLCALC", "VLDL", "HDL", "CHOLHDL", "LDLDIRECT" in the last 8760 hours.  HEMOGLOBIN A1C Lab Results  Component Value Date   HGBA1C 5.8 (H) 09/19/2022   MPG 119.76 09/19/2022   TSH No results for input(s): "TSH" in the last 8760 hours.  External labs:     Radiology:    Cardiac Studies:   No results found for this or any previous visit from the past 1095 days.     No results found for this or any previous visit from the past 1095 days.    EKG:   11/06/2022: sinus bradycardia, RBBB and LAFB  Assessment  ICD-10-CM   1. Essential hypertension  I10 EKG 12-Lead    PCV ECHOCARDIOGRAM COMPLETE    PCV MYOCARDIAL PERFUSION WITH LEXISCAN    CANCELED: PCV MYOCARDIAL PERFUSION WO LEXISCAN    2. Pre-op evaluation  Z01.818 PCV ECHOCARDIOGRAM COMPLETE    PCV MYOCARDIAL PERFUSION WITH LEXISCAN    CANCELED: PCV MYOCARDIAL PERFUSION WO LEXISCAN    3. Encounter for preprocedural cardiovascular examination  Z01.810 PCV ECHOCARDIOGRAM COMPLETE    PCV MYOCARDIAL PERFUSION WITH LEXISCAN    CANCELED: PCV MYOCARDIAL PERFUSION WO LEXISCAN    4. CKD (chronic kidney disease) stage 5, GFR less than 15 ml/min (HCC)  N18.5        Orders Placed This Encounter  Procedures   PCV MYOCARDIAL PERFUSION WITH LEXISCAN    Standing Status:   Future    Standing Expiration Date:    01/06/2023   EKG 12-Lead   PCV ECHOCARDIOGRAM COMPLETE    Standing Status:   Future    Standing Expiration Date:   11/07/2023    No orders of the defined types were placed in this encounter.   There are no discontinued medications.   Recommendations:   Colleen Gray is a 71 y.o.  female here for pre-op evaluation  Essential hypertension Continue current cardiac medications. Encourage low-sodium diet, less than 2000 mg daily. BP elevated as patient had hard time getting up the stairs here   Pre-op evaluation Echo and stress test ordered If within normal limits, will clear for shoulder surgery   CKD (chronic kidney disease) stage 5, GFR less than 15 ml/min (HCC) Follows with nephro and vascular surgery Patient recently had fistula revised    Floydene Flock, DO, [facility]  11/06/2022, 1:38 PM Office: [phone] Pager: [phone]

## 2022-11-18 ENCOUNTER — Ambulatory Visit: Payer: Medicare Other

## 2022-11-18 DIAGNOSIS — Z01818 Encounter for other preprocedural examination: Secondary | ICD-10-CM

## 2022-11-18 DIAGNOSIS — I10 Essential (primary) hypertension: Secondary | ICD-10-CM

## 2022-11-18 DIAGNOSIS — Z01810 Encounter for preprocedural cardiovascular examination: Secondary | ICD-10-CM

## 2022-11-27 ENCOUNTER — Ambulatory Visit: Payer: Medicare Other

## 2022-11-27 DIAGNOSIS — I10 Essential (primary) hypertension: Secondary | ICD-10-CM

## 2022-11-27 DIAGNOSIS — Z01810 Encounter for preprocedural cardiovascular examination: Secondary | ICD-10-CM

## 2022-11-27 DIAGNOSIS — Z01818 Encounter for other preprocedural examination: Secondary | ICD-10-CM

## 2022-12-02 ENCOUNTER — Other Ambulatory Visit: Payer: Federal, State, Local not specified - PPO

## 2022-12-17 ENCOUNTER — Ambulatory Visit: Payer: Medicare Other

## 2023-01-16 ENCOUNTER — Encounter (HOSPITAL_COMMUNITY): Payer: Self-pay | Admitting: Gastroenterology

## 2023-01-16 NOTE — Progress Notes (Signed)
Dwain Sarna Bowel Prep reminder: went over instructions  For Anesthesia: PCP - Kevan Ny MD Cardiologist- Dr Adrian Prows  Chest x-ray - 03/04/21 EKG -11/06/22 Stress Test -  n/a ECHO - 12/17/22 (in media scanned) Cardiac Cath -  n/a Pacemaker/ICD device last checked: n/a  Blood Thinner Instructions:n/a Last Dose: n/a     Anesthesia review: History of Murmur, HTN, Asthma, ESRD, enlarged heart. Last seen cardiology 11/06/22 ordered echo for pre op for shoulder surgery ,echo completed on 12/17/22. Has had some past issues with heart rate going low during anesthesia.

## 2023-01-17 NOTE — Progress Notes (Signed)
Anesthesia Chart Review   Case: [id number] Date/Time: 01/23/23 0830   Procedure: COLONOSCOPY WITH PROPOFOL   Anesthesia type: Monitor Anesthesia Care   Pre-op diagnosis: guaiac positive stool   [facility]   Surgeons: Carol Ada, MD       DISCUSSION:71 y.o. never smoker with h/o HTN, asthma, sleep apnea, ESRD on dialysis, guaiac positive stool scheduled for above procedure 01/23/23 with Dr. Carol Ada.   Pt seen by cardiology 11/06/2022 for preoperative evaluation.  Stress test and Echo ordered.   Echo 11/18/2022 with normal LV systolic function, EF 27-74%, mild mitral regurgitation.  Diastolic dysfunction now grade 1.  No significant change otherwise.  Stress test 12/17/2022.  Myocardial perfusion is abnormal. Soft tissue attenuation noted noted in the inferior wall. Super imposed on this, there is a reversible moderate sized defect in the basal and mid inferior region. Discussed with Dr. Shellia Carwin who states likely breast attenuation and that patient is low risk for upcoming colonoscopy and intermediate risk for upcoming shoulder surgery.  Dr. Shellia Carwin states no further cardiovascular testing needed. VS: LMP 12/23/2007 (Approximate)   PROVIDERS: Rogers Blocker, MD is PCP   Floydene Flock, DO is Cardiologist  LABS: Labs reviewed: Acceptable for surgery. (all labs ordered are listed, but only abnormal results are displayed)  Labs Reviewed - No data to display   IMAGES:   EKG:   CV: Lexiscan  Nuclear stress test 12/17/2022: Non-diagnostic ECG stress. The heart rate response was consistent with Regadenoson.  Myocardial perfusion is abnormal. Soft tissue attenuation noted noted in the inferior wall.  Super imposed on this, there is a reversible moderate sized defect in the basal and mid inferior region.  LV is mildly dilated in both rest and stress images. Normal TID index.  Overall LV systolic function is abnormal with basal and mid inferior wall  hypokinesis.  Stress LV EF low normal: 52%.  No previous exam available for comparison. Intermediate risk study.   Echocardiogram 11/18/2022:  Normal LV systolic function with visual EF 60-65%. Left ventricle cavity  is normal in size. Mild concentric hypertrophy of the left ventricle.  Normal global wall motion. Doppler evidence of grade I (impaired)  diastolic dysfunction, normal LAP. Calculated EF 67%.  Right ventricle cavity is slightly dilated. Normal right ventricular  function.  Trileaflet aortic valve with no regurgitation. Mild aortic valve leaflet  thickening.  Structurally normal mitral valve.  Mild (Grade I) mitral regurgitation.  Structurally normal tricuspid valve with trace regurgitation. No evidence  of pulmonary hypertension.  Compared to 11/8785, diastolic dysfunction is now grade I (previously  grade II). Otherwise, no significant change.  Past Medical History:  Diagnosis Date   Anemia    Arthritis    Asthma    Bradycardia    Chronic renal failure, stage 4 (severe) ([place]) 2017   no dialysis at this time, Stage 5 (as of 06/27/71)   Complication of anesthesia    during anesthesia heart rate is low    Enlarged heart    Sees Dr. Einar Gip   Heart murmur    due to rheumatic fever - Sees Dr Einar Gip   Hemorrhoid    History of kidney stones    History of rheumatic fever    with a heart murmer   Hypertension    Ovarian cyst 1985   removed   Pneumonia    Pre-diabetes    no meds   Psoriasis    Sleep apnea    does not  use Cpap (after weight loss no longer needed)    Past Surgical History:  Procedure Laterality Date   AV FISTULA PLACEMENT Left 05/24/2021   Procedure: LEFT ARM BRACHIOCEPHALIC ARTERIOVENOUS (AV) FISTULA CREATION;  Surgeon: Marty Heck, MD;  Location: [place];  Service: Vascular;  Laterality: Left;   [place] Left 07/09/2022   Procedure: LEFT ARM  ARTERIOVENOUS FISTULA  CREATION;  Surgeon: Serafina Mitchell, MD;  Location: [place];   Service: Vascular;  Laterality: Left;   [place] Left 09/19/2022   Procedure: SECOND STAGE LEFT BASILIC VEIN TRANSPOSITION;  Surgeon: Serafina Mitchell, MD;  Location: [place];  Service: Vascular;  Laterality: Left;   BUNIONECTOMY WITH HAMMERTOE RECONSTRUCTION Bilateral 1980's; 1999; 2009    left; right; left 'w/plates and screws"   COLONOSCOPY  2016   [facility] of colon cancer   DILATION AND CURETTAGE OF UTERUS  early 1980's   for missed AB   FISTULA SUPERFICIALIZATION Left 09/27/2021   Procedure: LEFT ARM ARTERIOVENOUS FISTULA REVISION AND SUPERFICIALIZATION;  Surgeon: Marty Heck, MD;  Location: [place];  Service: Vascular;  Laterality: Left;   JOINT REPLACEMENT     LAPAROSCOPIC GASTRIC BYPASS  2007   LAPAROSCOPIC OVARIAN CYSTECTOMY Right 1985   REVERSE SHOULDER ARTHROPLASTY Left 11/22/2015   Procedure: LEFT REVERSE SHOULDER ARTHROPLASTY;  Surgeon: Justice Britain, MD;  Location: [place];  Service: Orthopedics;  Laterality: Left;   ROTATOR CUFF REPAIR Right 2006    MEDICATIONS: No current facility-administered medications for this encounter.    albuterol (VENTOLIN HFA) 108 (90 Base) MCG/ACT inhaler   Alpha-Lipoic Acid 300 MG CAPS   amLODipine (NORVASC) 10 MG tablet   Blood Glucose Monitoring Suppl (GLUCOCOM BLOOD GLUCOSE MONITOR) DEVI   calcitRIOL (ROCALTROL) 0.5 MCG capsule   calcium elemental as carbonate (BARIATRIC TUMS ULTRA) 400 MG chewable tablet   Cholecalciferol (VITAMIN D3) 250 MCG (10000 UT) capsule   ferrous sulfate 325 (65 FE) MG tablet   folic acid (FOLVITE) 1 MG tablet   furosemide (LASIX) 40 MG tablet   HYDROcodone-acetaminophen (NORCO) 5-325 MG tablet   Ixekizumab (TALTZ) 80 MG/ML SOAJ   labetalol (NORMODYNE) 200 MG tablet   Multiple Vitamin (MULTIVITAMIN WITH MINERALS) TABS tablet   nitroGLYCERIN (NITROSTAT) 0.4 MG SL tablet   Omega 3-6-9 Fatty Acids (OMEGA-3-6-9 PO)   OVER THE COUNTER MEDICATION   sevelamer carbonate (RENVELA) 800 MG tablet    triamcinolone cream (KENALOG) 0.1 %   vitamin B-12 (CYANOCOBALAMIN) 1000 MCG tablet   vitamin C (ASCORBIC ACID) 500 MG tablet     [facility] Ward, PA-C [facility] [phone]

## 2023-01-23 ENCOUNTER — Ambulatory Visit (HOSPITAL_COMMUNITY): Payer: Medicare Other | Admitting: Physician Assistant

## 2023-01-23 ENCOUNTER — Ambulatory Visit (HOSPITAL_COMMUNITY)
Admission: RE | Admit: 2023-01-23 | Discharge: 2023-01-23 | Disposition: A | Discharge status: Home / Self Care | Payer: Medicare Other | Attending: Gastroenterology | Admitting: Gastroenterology

## 2023-01-23 ENCOUNTER — Encounter (HOSPITAL_COMMUNITY)
Admission: RE | Disposition: A | Discharge status: Home / Self Care | Payer: Self-pay | Source: Home / Self Care | Attending: Gastroenterology

## 2023-01-23 ENCOUNTER — Ambulatory Visit (HOSPITAL_BASED_OUTPATIENT_CLINIC_OR_DEPARTMENT_OTHER): Payer: Medicare Other | Admitting: Physician Assistant

## 2023-01-23 DIAGNOSIS — K635 Polyp of colon: Secondary | ICD-10-CM | POA: Diagnosis not present

## 2023-01-23 DIAGNOSIS — I120 Hypertensive chronic kidney disease with stage 5 chronic kidney disease or end stage renal disease: Secondary | ICD-10-CM

## 2023-01-23 DIAGNOSIS — Z9884 Bariatric surgery status: Secondary | ICD-10-CM | POA: Insufficient documentation

## 2023-01-23 DIAGNOSIS — Z800 Family history of malignant neoplasm of digestive organs: Secondary | ICD-10-CM | POA: Insufficient documentation

## 2023-01-23 DIAGNOSIS — E1122 Type 2 diabetes mellitus with diabetic chronic kidney disease: Secondary | ICD-10-CM

## 2023-01-23 DIAGNOSIS — Z1211 Encounter for screening for malignant neoplasm of colon: Secondary | ICD-10-CM | POA: Diagnosis not present

## 2023-01-23 DIAGNOSIS — N186 End stage renal disease: Secondary | ICD-10-CM

## 2023-01-23 DIAGNOSIS — R195 Other fecal abnormalities: Secondary | ICD-10-CM | POA: Insufficient documentation

## 2023-01-23 DIAGNOSIS — D125 Benign neoplasm of sigmoid colon: Secondary | ICD-10-CM | POA: Diagnosis not present

## 2023-01-23 DIAGNOSIS — D120 Benign neoplasm of cecum: Secondary | ICD-10-CM | POA: Diagnosis not present

## 2023-01-23 DIAGNOSIS — J45909 Unspecified asthma, uncomplicated: Secondary | ICD-10-CM | POA: Diagnosis not present

## 2023-01-23 DIAGNOSIS — K5730 Diverticulosis of large intestine without perforation or abscess without bleeding: Secondary | ICD-10-CM | POA: Diagnosis not present

## 2023-01-23 DIAGNOSIS — G4730 Sleep apnea, unspecified: Secondary | ICD-10-CM | POA: Insufficient documentation

## 2023-01-23 DIAGNOSIS — Z992 Dependence on renal dialysis: Secondary | ICD-10-CM | POA: Diagnosis not present

## 2023-01-23 DIAGNOSIS — D123 Benign neoplasm of transverse colon: Secondary | ICD-10-CM | POA: Insufficient documentation

## 2023-01-23 DIAGNOSIS — Z87442 Personal history of urinary calculi: Secondary | ICD-10-CM | POA: Insufficient documentation

## 2023-01-23 DIAGNOSIS — D631 Anemia in chronic kidney disease: Secondary | ICD-10-CM

## 2023-01-23 HISTORY — PX: COLONOSCOPY WITH PROPOFOL: SHX5780

## 2023-01-23 HISTORY — PX: POLYPECTOMY: SHX5525

## 2023-01-23 LAB — POCT I-STAT, CHEM 8
BUN: 77 mg/dL — ABNORMAL HIGH (ref 8–23)
Calcium, Ion: 1.12 mmol/L — ABNORMAL LOW (ref 1.15–1.40)
Chloride: 102 mmol/L (ref 98–111)
Creatinine, Ser: 5.2 mg/dL — ABNORMAL HIGH (ref 0.44–1.00)
Glucose, Bld: 127 mg/dL — ABNORMAL HIGH (ref 70–99)
HCT: 36 % (ref 36.0–46.0)
Hemoglobin: 12.2 g/dL (ref 12.0–15.0)
Potassium: 3.9 mmol/L (ref 3.5–5.1)
Sodium: 140 mmol/L (ref 135–145)
TCO2: 27 mmol/L (ref 22–32)

## 2023-01-23 SURGERY — COLONOSCOPY WITH PROPOFOL
Anesthesia: Monitor Anesthesia Care

## 2023-01-23 MED ORDER — SODIUM CHLORIDE 0.9 % IV SOLN: INTRAVENOUS | Status: DC

## 2023-01-23 MED ORDER — PROPOFOL 500 MG/50ML IV EMUL
INTRAVENOUS | Status: AC
  Filled 2023-01-23: qty 50

## 2023-01-23 MED ORDER — PROPOFOL 10 MG/ML IV BOLUS
INTRAVENOUS | Status: DC | PRN
  Administered 2023-01-23: 40 mg via INTRAVENOUS

## 2023-01-23 MED ORDER — PROPOFOL 500 MG/50ML IV EMUL
INTRAVENOUS | Status: DC | PRN
  Administered 2023-01-23: 100 ug/kg/min via INTRAVENOUS

## 2023-01-23 MED ORDER — LACTATED RINGERS IV SOLN: INTRAVENOUS | Status: DC

## 2023-01-23 SURGICAL SUPPLY — 22 items

## 2023-01-23 NOTE — H&P (Addendum)
Colleen Gray HPI: This 72 year old white female presents to the office for colorectal cancer screening. She was noted to have guaiac positive stool cards done in February 2023. She has 2 BM's per day with no obvious blood or mucus in the stool. She has a good appetite and her weight has been stable. She denies having any complaints of abdominal pain, nausea, vomiting, acid reflux, dysphagia or odynophagia. She denies having a family history of celiac sprue or IBD. Her mother was diagnosed with colon cancer at the age of 54 and her maternal cousin was diagnosed with colon cancer at the age of 45. Her last colonoscopy done on 05/25/2015 revealed scattered diverticula but the exam was otherwise normal.   Past Medical History:  Diagnosis Date   Anemia    Arthritis    Asthma    Bradycardia    Chronic renal failure, stage 4 (severe) ([place]) 2017   no dialysis at this time, Stage 5 (as of 01/31/92)   Complication of anesthesia    during anesthesia heart rate is low    Enlarged heart    Sees Dr. Einar Gip   Heart murmur    due to rheumatic fever - Sees Dr Einar Gip   Hemorrhoid    History of kidney stones    History of rheumatic fever    with a heart murmer   Hypertension    Ovarian cyst 1985   removed   Pneumonia    Pre-diabetes    no meds   Psoriasis    Sleep apnea    does not use Cpap (after weight loss no longer needed)    Past Surgical History:  Procedure Laterality Date   AV FISTULA PLACEMENT Left 05/24/2021   Procedure: LEFT ARM BRACHIOCEPHALIC ARTERIOVENOUS (AV) FISTULA CREATION;  Surgeon: Marty Heck, MD;  Location: [place];  Service: Vascular;  Laterality: Left;   [place] Left 07/09/2022   Procedure: LEFT ARM  ARTERIOVENOUS FISTULA  CREATION;  Surgeon: Serafina Mitchell, MD;  Location: [place];  Service: Vascular;  Laterality: Left;   [place] Left 09/19/2022   Procedure: SECOND STAGE LEFT BASILIC VEIN TRANSPOSITION;  Surgeon: Serafina Mitchell,  MD;  Location: [place];  Service: Vascular;  Laterality: Left;   BUNIONECTOMY WITH HAMMERTOE RECONSTRUCTION Bilateral 1980's; 1999; 2009    left; right; left 'w/plates and screws"   COLONOSCOPY  2016   [facility] of colon cancer   DILATION AND CURETTAGE OF UTERUS  early 1980's   for missed AB   FISTULA SUPERFICIALIZATION Left 09/27/2021   Procedure: LEFT ARM ARTERIOVENOUS FISTULA REVISION AND SUPERFICIALIZATION;  Surgeon: Marty Heck, MD;  Location: [place];  Service: Vascular;  Laterality: Left;   JOINT REPLACEMENT     LAPAROSCOPIC GASTRIC BYPASS  2007   LAPAROSCOPIC OVARIAN CYSTECTOMY Right 1985   REVERSE SHOULDER ARTHROPLASTY Left 11/22/2015   Procedure: LEFT REVERSE SHOULDER ARTHROPLASTY;  Surgeon: Justice Britain, MD;  Location: [place];  Service: Orthopedics;  Laterality: Left;   ROTATOR CUFF REPAIR Right 2006    Family History  Problem Relation Age of Onset   Colon cancer Mother 73   Diabetes Mother    Hypertension Mother    Diabetes Father    Hypertension Father    Colon cancer Maternal Grandmother 37   Diabetes Paternal Grandmother    Diabetes Paternal Grandfather    Breast cancer Neg Hx     Social History:  reports that she has never smoked. She has never used  smokeless tobacco. She reports that she does not drink alcohol and does not use drugs.  Allergies:  Allergies  Allergen Reactions   Nifedipine Swelling   Other     Surgery Glue caused blisters   Penicillins Rash    Medications: Scheduled: Continuous:  sodium chloride     lactated ringers 50 mL/hr at 01/23/23 0806    Results for orders placed or performed during the hospital encounter of 01/23/23 (from the past 24 hour(s))  I-STAT, chem 8     Status: Abnormal   Collection Time: 01/23/23  8:07 AM  Result Value Ref Range   Sodium 140 135 - 145 mmol/L   Potassium 3.9 3.5 - 5.1 mmol/L   Chloride 102 98 - 111 mmol/L   BUN 77 (H) 8 - 23 mg/dL   Creatinine, Ser 5.20 (H) 0.44 - 1.00 mg/dL   Glucose, Bld 127 (H)  70 - 99 mg/dL   Calcium, Ion 1.12 (L) 1.15 - 1.40 mmol/L   TCO2 27 22 - 32 mmol/L   Hemoglobin 12.2 12.0 - 15.0 g/dL   HCT 36.0 36.0 - 46.0 %     No results found.  ROS:  As stated above in the HPI otherwise negative.  Blood pressure (!) 168/60, pulse (!) 55, temperature 98.6 F (37 C), temperature source Temporal, resp. rate 12, last menstrual period 12/23/2007, SpO2 99 %.    PE: Gen: NAD, Alert and Oriented HEENT:  [place]/AT, EOMI Neck: Supple, no LAD Lungs: CTA Bilaterally CV: RRR without M/G/R ABD: Soft, NTND, +BS Ext: No C/C/E  Assessment/Plan: 1) Heme positive stool - colonoscopy.  Jamarr Treinen D 01/23/2023, 8:11 AM

## 2023-01-23 NOTE — Anesthesia Preprocedure Evaluation (Signed)
Anesthesia Evaluation  Patient identified by MRN, date of birth, ID band Patient awake    Reviewed: Allergy & Precautions, NPO status , Patient's Chart, lab work & pertinent test results  Airway Mallampati: II  TM Distance: >3 FB Neck ROM: Full    Dental  (+) Dental Advisory Given   Pulmonary asthma , sleep apnea    breath sounds clear to auscultation       Cardiovascular hypertension, Pt. on medications and Pt. on home beta blockers  Rhythm:Regular Rate:Normal     Neuro/Psych  Neuromuscular disease    GI/Hepatic negative GI ROS, Neg liver ROS,,,  Endo/Other  diabetes, Type 2    Renal/GU ESRF and CRFRenal disease     Musculoskeletal  (+) Arthritis ,    Abdominal   Peds  Hematology  (+) Blood dyscrasia, anemia   Anesthesia Other Findings   Reproductive/Obstetrics                             Anesthesia Physical Anesthesia Plan  ASA: 3  Anesthesia Plan: MAC   Post-op Pain Management: Minimal or no pain anticipated   Induction:   PONV Risk Score and Plan: 2 and Propofol infusion  Airway Management Planned: Natural Airway and Simple Face Mask  Additional Equipment:   Intra-op Plan:   Post-operative Plan:   Informed Consent: I have reviewed the patients History and Physical, chart, labs and discussed the procedure including the risks, benefits and alternatives for the proposed anesthesia with the patient or authorized representative who has indicated his/her understanding and acceptance.       Plan Discussed with:   Anesthesia Plan Comments:        Anesthesia Quick Evaluation

## 2023-01-23 NOTE — Transfer of Care (Signed)
Immediate Anesthesia Transfer of Care Note  Patient: Colleen Gray  Procedure(s) Performed: COLONOSCOPY WITH PROPOFOL  Patient Location: Endoscopy Unit  Anesthesia Type:MAC  Level of Consciousness: awake and patient cooperative  Airway & Oxygen Therapy: Patient Spontanous Breathing and Patient connected to face mask  Post-op Assessment: Report given to RN and Post -op Vital signs reviewed and stable  Post vital signs: Reviewed and stable  Last Vitals:  Vitals Value Taken Time  BP    Temp    Pulse 61 01/23/23 0848  Resp 14 01/23/23 0848  SpO2 99 % 01/23/23 0848  Vitals shown include unvalidated device data.  Last Pain:  Vitals:   01/23/23 0804  TempSrc: Temporal         Complications: No notable events documented.

## 2023-01-23 NOTE — Op Note (Addendum)
[facility] Patient Name: Colleen Gray Procedure Date: 01/23/2023 MRN: [id number] Attending MD: Carol Ada , MD, [id number] Date of Birth: 01-28-1951 CSN: [id number] Age: 72 Admit Type: Outpatient Procedure:                Colonoscopy Indications:              Heme positive stool Providers:                Carol Ada, MD, Dulcy Fanny, Cherylynn Ridges,                            Technician, Dellie Catholic Referring MD:              Medicines:                Propofol per Anesthesia Complications:            No immediate complications. Estimated Blood Loss:     Estimated blood loss: none. Procedure:                Pre-Anesthesia Assessment:                           - Prior to the procedure, a History and Physical                            was performed, and patient medications and                            allergies were reviewed. The patient's tolerance of                            previous anesthesia was also reviewed. The risks                            and benefits of the procedure and the sedation                            options and risks were discussed with the patient.                            All questions were answered, and informed consent                            was obtained. Prior Anticoagulants: The patient has                            taken no anticoagulant or antiplatelet agents. ASA                            Grade Assessment: III - A patient with severe                            systemic disease. After reviewing the risks and  benefits, the patient was deemed in satisfactory                            condition to undergo the procedure.                           - Sedation was administered by an anesthesia                            professional. Deep sedation was attained.                           After obtaining informed consent, the colonoscope                            was passed under direct vision.  Throughout the                            procedure, the patient's blood pressure, pulse, and                            oxygen saturations were monitored continuously. The                            CF-HQ190L ([id number]) Olympus colonoscope was                            introduced through the anus and advanced to the the                            cecum, identified by appendiceal orifice and                            ileocecal valve. The colonoscopy was performed                            without difficulty. The patient tolerated the                            procedure well. The quality of the bowel                            preparation was evaluated using the BBPS [facility]                            Bowel Preparation Scale) with scores of: Right                            Colon = 2 (minor amount of residual staining, small                            fragments of stool and/or opaque liquid, but mucosa  seen well), Transverse Colon = 2 (minor amount of                            residual staining, small fragments of stool and/or                            opaque liquid, but mucosa seen well) and Left Colon                            = 2 (minor amount of residual staining, small                            fragments of stool and/or opaque liquid, but mucosa                            seen well). The total BBPS score equals 6. The                            quality of the bowel preparation was good. The                            ileocecal valve, appendiceal orifice, and rectum                            were photographed. Scope In: 8:26:25 AM Scope Out: 8:43:10 AM Scope Withdrawal Time: 0 hours 11 minutes 19 seconds  Total Procedure Duration: 0 hours 16 minutes 45 seconds  Findings:      Three sessile polyps were found in the sigmoid colon, transverse colon       and cecum. The polyps were 2 to 4 mm in size. These polyps were removed       with a cold snare.  Resection and retrieval were complete.      Scattered large-mouthed and small-mouthed diverticula were found in the       sigmoid colon and descending colon. Impression:               - Three 2 to 4 mm polyps in the sigmoid colon, in                            the transverse colon and in the cecum, removed with                            a cold snare. Resected and retrieved.                           - Diverticulosis in the sigmoid colon and in the                            descending colon. Moderate Sedation:      Not Applicable - Patient had care per Anesthesia. Recommendation:           - Patient has a contact number available for  emergencies. The signs and symptoms of potential                            delayed complications were discussed with the                            patient. Return to normal activities tomorrow.                            Written discharge instructions were provided to the                            patient.                           - Resume previous diet.                           - Continue present medications.                           - Await pathology results.                           - Repeat colonoscopy in 5 years for surveillance,                            if clinically appropriate. She has a family history                            of colon cancer in her mother, aged 38, and a                            maternal cousin at the age of 47. Procedure Code(s):        --- Professional ---                           [phone], Colonoscopy, flexible; with removal of                            tumor(s), polyp(s), or other lesion(s) by snare                            technique Diagnosis Code(s):        --- Professional ---                           D12.5, Benign neoplasm of sigmoid colon                           D12.3, Benign neoplasm of transverse colon (hepatic                            flexure or splenic flexure)  D12.0, Benign neoplasm of cecum                           R19.5, Other fecal abnormalities                           K57.30, Diverticulosis of large intestine without                            perforation or abscess without bleeding CPT copyright 2022 American Medical Association. All rights reserved. The codes documented in this report are preliminary and upon coder review may  be revised to meet current compliance requirements. Carol Ada, MD Carol Ada, MD 01/23/2023 8:51:20 AM This report has been signed electronically. Number of Addenda: 0

## 2023-01-23 NOTE — Discharge Instructions (Signed)

## 2023-01-23 NOTE — Anesthesia Procedure Notes (Signed)
Procedure Name: MAC Date/Time: 01/23/2023 8:25 AM  Performed by: Claudia Desanctis, CRNAPre-anesthesia Checklist: Patient identified, Emergency Drugs available, Suction available and Patient being monitored Patient Re-evaluated:Patient Re-evaluated prior to induction Oxygen Delivery Method: Simple face mask Induction Type: IV induction Placement Confirmation: positive ETCO2 and breath sounds checked- equal and bilateral Dental Injury: Teeth and Oropharynx as per pre-operative assessment

## 2023-01-25 ENCOUNTER — Encounter (HOSPITAL_COMMUNITY): Payer: Self-pay | Admitting: Gastroenterology

## 2023-01-26 LAB — SURGICAL PATHOLOGY

## 2023-01-26 NOTE — Anesthesia Postprocedure Evaluation (Signed)
Anesthesia Post Note  Patient: Colleen Gray  Procedure(s) Performed: COLONOSCOPY WITH PROPOFOL POLYPECTOMY     Patient location during evaluation: PACU Anesthesia Type: MAC Level of consciousness: awake and alert Pain management: pain level controlled Vital Signs Assessment: post-procedure vital signs reviewed and stable Respiratory status: spontaneous breathing, nonlabored ventilation, respiratory function stable and patient connected to nasal cannula oxygen Cardiovascular status: stable and blood pressure returned to baseline Postop Assessment: no apparent nausea or vomiting Anesthetic complications: no   No notable events documented.  Last Vitals:  Vitals:   01/23/23 0903 01/23/23 0908  BP: (!) 135/54 (!) 126/59  Pulse: (!) 59 65  Resp: 13 15  Temp:    SpO2: 99% 100%    Last Pain:  Vitals:   01/23/23 0908  TempSrc:   PainSc: 0-No pain                 Tiajuana Amass

## 2023-04-03 ENCOUNTER — Ambulatory Visit
Admission: RE | Admit: 2023-04-03 | Discharge: 2023-04-03 | Disposition: A | Discharge status: Home / Self Care | Payer: Medicare Other | Source: Ambulatory Visit | Attending: Internal Medicine | Admitting: Internal Medicine

## 2023-04-03 ENCOUNTER — Other Ambulatory Visit: Payer: Self-pay | Admitting: Internal Medicine

## 2023-04-03 DIAGNOSIS — Z1231 Encounter for screening mammogram for malignant neoplasm of breast: Secondary | ICD-10-CM

## 2023-04-30 NOTE — Patient Instructions (Addendum)
SURGICAL WAITING ROOM VISITATION Patients having surgery or a procedure may have no more than 2 support people in the waiting area - these visitors may rotate in the visitor waiting room.   Due to an increase in RSV and influenza rates and associated hospitalizations, children ages 98 and under may not visit patients in [facility] hospitals. If the patient needs to stay at the hospital during part of their recovery, the visitor guidelines for inpatient rooms apply.  PRE-OP VISITATION  Pre-op nurse will coordinate an appropriate time for 1 support person to accompany the patient in pre-op.  This support person may not rotate.  This visitor will be contacted when the time is appropriate for the visitor to come back in the pre-op area.  Please refer to the [facility] website for the visitor guidelines for Inpatients (after your surgery is over and you are in a regular room).  You are not required to quarantine at this time prior to your surgery. However, you must do this: Hand Hygiene often Do NOT share personal items Notify your provider if you are in close contact with someone who has COVID or you develop fever 100.4 or greater, new onset of sneezing, cough, sore throat, shortness of breath or body aches.  If you test positive for Covid or have been in contact with anyone that has tested positive in the last 10 days please notify you surgeon.    Your procedure is scheduled on:  Wednesday  May 13, 2023  Report to [facility] Main Entrance: [facility] entrance where the [employer] is available.   Report to admitting at: 08:00    AM  +++++Call this number if you have any questions or problems the morning of surgery [phone]  DO NOT EAT OR DRINK ANYTHING AFTER MIDNIGHT THE NIGHT PRIOR TO YOUR SURGERY / PROCEDURE.   FOLLOW  ANY ADDITIONAL PRE OP INSTRUCTIONS YOU RECEIVED FROM YOUR SURGEON'S OFFICE!!!   Oral Hygiene is also important to reduce your risk of infection.         Remember - BRUSH YOUR TEETH THE MORNING OF SURGERY WITH YOUR REGULAR TOOTHPASTE  Do NOT smoke after Midnight the night before surgery.  Take ONLY these medicines the morning of surgery with A SIP OF WATER: amlodipine, labetalol                    You may not have any metal on your body including hair pins, jewelry, and body piercing  Do not wear make-up, lotions, powders, perfumes, or deodorant  Do not wear nail polish including gel and S&S, artificial / acrylic nails, or any other type of covering on natural nails including finger and toenails. If you have artificial nails, gel coating, etc., that needs to be removed by a nail salon, Please have this removed prior to surgery. Not doing so may mean that your surgery could be cancelled or delayed if the Surgeon or anesthesia staff feels like they are unable to monitor you safely.   Do not shave 48 hours prior to surgery to avoid nicks in your skin which may contribute to postoperative infections.   Contacts, Hearing Aids, dentures or bridgework may not be worn into surgery. DENTURES WILL BE REMOVED PRIOR TO SURGERY PLEASE DO NOT APPLY "Poly grip" OR ADHESIVES!!!  You may bring a small overnight bag with you on the day of surgery, only pack items that are not valuable. [facility] IS NOT RESPONSIBLE   FOR VALUABLES THAT ARE  LOST OR STOLEN.    Do not bring your home medications to the hospital. The Pharmacy will dispense medications listed on your medication list to you during your admission in the Hospital.  Special Instructions: Bring a copy of your healthcare power of attorney and living will documents the day of surgery, if you wish to have them scanned into your [facility] Records- EPIC  Please read over the following fact sheets you were given: IF YOU HAVE QUESTIONS ABOUT YOUR PRE-OP INSTRUCTIONS, PLEASE CALL [phone].   +++++++ PLEASE FOLLOW THE ATTACHED INFORMATION REGARDING SHOWERING / BATHING SCHEDULE  PRIOR TO YOUR  SURGERY. Start this schedule on :  Saturday  May 09, 2023   Preparing for Total Shoulder Arthroplasty ================================================================= Please follow these instructions carefully, in addition to any other special Bathing information that was explained to you at the Presurgical Appointment:  BENZOYL PEROXIDE 5% GEL: Used to kill bacteria on the skin which could cause an infection at the surgery site.   Please do not use if you have an allergy to benzoyl peroxide. If your skin becomes reddened/irritated stop using the benzoyl peroxide and inform your Doctor.   Starting two days before surgery, apply as follows:  1. Apply benzoyl peroxide gel in the morning and at night. Apply after taking a shower. If you are not taking a shower, clean entire shoulder front, back, and side, along with the armpit with a clean wet washcloth.  2. Place a quarter-sized dollop of the gel on your SHOULDER and rub in thoroughly, making sure to cover the front, back, and side of your shoulder, along with the armpit.   2 Days prior to Surgery      Monday  May 11, 2023 First Application _______ Morning Second Application _______ Night  Day Before Surgery            Tuesday  May 12, 2023 First Application______ Morning  On the night before surgery, wash your entire body (except hair, face and private areas) with CHG Soap. THEN, rub in the LAST application of the Benzoyl Peroxide Gel on your shoulder.   3. On the Morning of Surgery wash your BODY AGAIN with CHG Soap (except hair, face and private areas)  4. DO NOT USE THE BENZOYL PEROXIDE GEL ON THE DAY OF YOUR SURGERY    ON THE DAY OF SURGERY : Do not apply any lotions/deodorants the morning of surgery.  Please wear clean clothes to the hospital/surgery center.    FAILURE TO FOLLOW THESE INSTRUCTIONS MAY RESULT IN THE CANCELLATION OF YOUR SURGERY  PATIENT SIGNATURE_________________________________  NURSE  SIGNATURE__________________________________  ________________________________________________________________________         Rogelia Mire    An incentive spirometer is a tool that can help keep your lungs clear and active. This tool measures how well you are filling your lungs with each breath. Taking long deep breaths may help reverse or decrease the chance of developing breathing (pulmonary) problems (especially infection) following: A long period of time when you are unable to move or be active. BEFORE THE PROCEDURE  If the spirometer includes an indicator to show your best effort, your nurse or respiratory therapist will set it to a desired goal. If possible, sit up straight or lean slightly forward. Try not to slouch. Hold the incentive spirometer in an upright position. INSTRUCTIONS FOR USE  Sit on the edge of your bed if possible, or sit up as far as you can in bed or on a chair. Hold the incentive  spirometer in an upright position. Breathe out normally. Place the mouthpiece in your mouth and seal your lips tightly around it. Breathe in slowly and as deeply as possible, raising the piston or the ball toward the top of the column. Hold your breath for 3-5 seconds or for as long as possible. Allow the piston or ball to fall to the bottom of the column. Remove the mouthpiece from your mouth and breathe out normally. Rest for a few seconds and repeat Steps 1 through 7 at least 10 times every 1-2 hours when you are awake. Take your time and take a few normal breaths between deep breaths. The spirometer may include an indicator to show your best effort. Use the indicator as a goal to work toward during each repetition. After each set of 10 deep breaths, practice coughing to be sure your lungs are clear. If you have an incision (the cut made at the time of surgery), support your incision when coughing by placing a pillow or rolled up towels firmly against it. Once you are able  to get out of bed, walk around indoors and cough well. You may stop using the incentive spirometer when instructed by your caregiver.  RISKS AND COMPLICATIONS Take your time so you do not get dizzy or light-headed. If you are in pain, you may need to take or ask for pain medication before doing incentive spirometry. It is harder to take a deep breath if you are having pain. AFTER USE Rest and breathe slowly and easily. It can be helpful to keep track of a log of your progress. Your caregiver can provide you with a simple table to help with this. If you are using the spirometer at home, follow these instructions: SEEK MEDICAL CARE IF:  You are having difficultly using the spirometer. You have trouble using the spirometer as often as instructed. Your pain medication is not giving enough relief while using the spirometer. You develop fever of 100.5 F (38.1 C) or higher.                                                                                                    SEEK IMMEDIATE MEDICAL CARE IF:  You cough up bloody sputum that had not been present before. You develop fever of 102 F (38.9 C) or greater. You develop worsening pain at or near the incision site. MAKE SURE YOU:  Understand these instructions. Will watch your condition. Will get help right away if you are not doing well or get worse. Document Released: 04/20/2007 Document Revised: 03/01/2012 Document Reviewed: 06/21/2007 [facility] Patient Information 2014 [place], [place].

## 2023-04-30 NOTE — Progress Notes (Addendum)
COVID Vaccine received:  []  No [x]  Yes Date of any COVID positive Test in last 90 days: None  PCP - Willey Blade, MD  clearance on Chart Cardiologist- Clotilde Dieter, MD Nephrology- Dr. Arrie Aran  - clearance on Chart  Chest x-ray - 03-04-2021  2v  Epic EKG -11-06-2022  Epic   Stress Test - Eugenie Birks 12-17-2022  Epic ECHO - 11-18-2022  Epic Cardiac Cath -   PCR screen: [x]  Ordered & Completed           []   No Order but Needs PROFEND           []   N/A for this surgery  Surgery Plan:  []  Ambulatory                            [x]  Outpatient in bed                            []  Admit  Anesthesia:    [x]  General  []  Spinal                           []   Choice []   MAC  Pacemaker / ICD device [x]  No []  Yes   Spinal Cord Stimulator:[x]  No []  Yes       History of Sleep Apnea? []  No [x]  Yes   CPAP used?- [x]  No []  Yes    Does the patient monitor blood sugar?          []  No [x]  Yes  []  N/A Last A1c on 09-19-2022 was 5.8  no meds just Diet control  Patient has: []  NO Hx DM   [x]  Pre-DM                 []  DM1  []   DM2 Does patient have a [employer] or Dexacom? [x]  No []  Yes   Fasting Blood Sugar Ranges- 100-120 Checks Blood Sugar _1__ times a day  Blood Thinner / Instructions:  None Aspirin Instructions:   none  ERAS Protocol Ordered: [x]  No  []  Yes Patient is to be NPO after: Midnight prior  Comments: LEFT ARM RESTRICTION: Patient has  NOT started on Hemodialysis yet; It will be sometime after her shoulder surgery. 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   Patient was given the 5 CHG shower / bath instructions for THA surgery along with 2 bottles of the CHG soap. Patient will start this on:  Saturday May 09, 2023.  Patient voiced understanding of this process.   The patient was given Benzoyl peroxide Gel as ordered. Instruction regarding application starting 2 days prior to surgery was given and patient voiced understanding.   Activity level: Patient is unable to  climb a flight of stairs without difficulty.  Patient can perform ADLs without assistance.   Anesthesia review: HTN, ESRD not on HD yet- left BVT, has CM, Rheumatic fever- Murmur- Valve disease, PRE-DM, anemia, ? HR during some anesthesia, OSA-no CPAP, asthma  Patient denies shortness of breath, fever, cough and chest pain at PAT appointment.  Patient verbalized understanding and agreement to the Pre-Surgical Instructions that were given to them at this PAT appointment. Patient was also educated of the need to review these PAT instructions again prior to her surgery.I reviewed the appropriate phone numbers to call if they have any and questions or concerns.

## 2023-05-01 ENCOUNTER — Encounter (HOSPITAL_COMMUNITY): Payer: Self-pay

## 2023-05-01 ENCOUNTER — Encounter (HOSPITAL_COMMUNITY)
Admission: RE | Admit: 2023-05-01 | Discharge: 2023-05-01 | Disposition: A | Discharge status: Home / Self Care | Payer: Medicare Other | Source: Ambulatory Visit | Attending: Orthopaedic Surgery | Admitting: Orthopaedic Surgery

## 2023-05-01 ENCOUNTER — Other Ambulatory Visit: Payer: Self-pay

## 2023-05-01 VITALS — BP 150/72 | HR 55 | Temp 98.3°F | Resp 20 | Ht 63.0 in | Wt 235.0 lb

## 2023-05-01 DIAGNOSIS — Z01818 Encounter for other preprocedural examination: Secondary | ICD-10-CM

## 2023-05-01 DIAGNOSIS — I10 Essential (primary) hypertension: Secondary | ICD-10-CM | POA: Diagnosis not present

## 2023-05-01 DIAGNOSIS — I120 Hypertensive chronic kidney disease with stage 5 chronic kidney disease or end stage renal disease: Secondary | ICD-10-CM | POA: Diagnosis not present

## 2023-05-01 DIAGNOSIS — G4730 Sleep apnea, unspecified: Secondary | ICD-10-CM | POA: Insufficient documentation

## 2023-05-01 DIAGNOSIS — N184 Chronic kidney disease, stage 4 (severe): Secondary | ICD-10-CM | POA: Insufficient documentation

## 2023-05-01 DIAGNOSIS — Z01812 Encounter for preprocedural laboratory examination: Secondary | ICD-10-CM | POA: Insufficient documentation

## 2023-05-01 DIAGNOSIS — M19011 Primary osteoarthritis, right shoulder: Secondary | ICD-10-CM | POA: Diagnosis not present

## 2023-05-01 DIAGNOSIS — E119 Type 2 diabetes mellitus without complications: Secondary | ICD-10-CM | POA: Diagnosis not present

## 2023-05-01 LAB — CBC
HCT: 36.8 % (ref 36.0–46.0)
Hemoglobin: 11.4 g/dL — ABNORMAL LOW (ref 12.0–15.0)
MCH: 28.7 pg (ref 26.0–34.0)
MCHC: 31 g/dL (ref 30.0–36.0)
MCV: 92.7 fL (ref 80.0–100.0)
Platelets: 197 10*3/uL (ref 150–400)
RBC: 3.97 MIL/uL (ref 3.87–5.11)
RDW: 13.8 % (ref 11.5–15.5)
WBC: 5.6 10*3/uL (ref 4.0–10.5)
nRBC: 0 % (ref 0.0–0.2)

## 2023-05-01 LAB — SURGICAL PCR SCREEN
MRSA, PCR: NEGATIVE
Staphylococcus aureus: NEGATIVE

## 2023-05-01 LAB — BASIC METABOLIC PANEL
Anion gap: 12 (ref 5–15)
BUN: 109 mg/dL — ABNORMAL HIGH (ref 8–23)
CO2: 25 mmol/L (ref 22–32)
Calcium: 9 mg/dL (ref 8.9–10.3)
Chloride: 104 mmol/L (ref 98–111)
Creatinine, Ser: 6.31 mg/dL — ABNORMAL HIGH (ref 0.44–1.00)
GFR, Estimated: 7 mL/min — ABNORMAL LOW (ref >60)
Glucose, Bld: 136 mg/dL — ABNORMAL HIGH (ref 70–99)
Potassium: 4.2 mmol/L (ref 3.5–5.1)
Sodium: 141 mmol/L (ref 135–145)

## 2023-05-01 LAB — GLUCOSE, CAPILLARY: Glucose-Capillary: 121 mg/dL — ABNORMAL HIGH (ref 70–99)

## 2023-05-01 LAB — HEMOGLOBIN A1C
Hgb A1c MFr Bld: 5.9 % — ABNORMAL HIGH (ref 4.8–5.6)
Mean Plasma Glucose: 122.63 mg/dL

## 2023-05-05 NOTE — Progress Notes (Addendum)
Anesthesia Chart Review   Case: [id number] Date/Time: 05/13/23 1015   Procedure: REVERSE SHOULDER ARTHROPLASTY (Right: Shoulder)   Anesthesia type: General   Pre-op diagnosis: RIGHT SHOULDER DJD   Location: [facility] ROOM 06 / [facility]   Surgeons: Bjorn Pippin, MD       DISCUSSION:72 y.o. never smoker with h/o HTN, sleep apnea, CKD Stage IV, right shoulder djd scheduled for above procedure 05/13/23 with Dr. Ramond Marrow.   Pt seen by cardiology 11/06/2022 for preoperative evaluation.  Stress test and Echo ordered.    Echo 11/18/2022 with normal LV systolic function, EF 60-65%, mild mitral regurgitation.  Diastolic dysfunction now grade 1.  No significant change otherwise.   Stress test 12/17/2022.  Myocardial perfusion is abnormal. Soft tissue attenuation noted noted in the inferior wall. Super imposed on this, there is a reversible moderate sized defect in the basal and mid inferior region. Discussed with Dr. Melton Alar who states likely breast attenuation and that patient is low risk for upcoming colonoscopy and intermediate risk for upcoming shoulder surgery.  Dr. Melton Alar states no further cardiovascular testing needed.  Clearance received from nephrology to proceed with shoulder surgery. Per notes pt is planning to start hemodialysis after surgery. Left arm fistula in place. Discussed recent labs with Dr. Arrie Aran on 05/05/2023, ok to proceed with shoulder surgery, pt without uremic symptoms at her last office visit.  VS: BP (!) 150/72 Comment: right forearm sitting  Pulse (!) 55   Temp 36.8 C (Oral)   Resp 20   Ht 5\' 3"  (1.6 m)   Wt 106.6 kg   LMP 12/23/2007 (Approximate)   SpO2 100%   BMI 41.63 kg/m   PROVIDERS: Gwenyth Bender, MD is PCP    LABS:  forwarded to nephrology (all labs ordered are listed, but only abnormal results are displayed)  Labs Reviewed  HEMOGLOBIN A1C - Abnormal; Notable for the following components:      Result Value   Hgb A1c MFr Bld 5.9 (*)    All other  components within normal limits  CBC - Abnormal; Notable for the following components:   Hemoglobin 11.4 (*)    All other components within normal limits  BASIC METABOLIC PANEL - Abnormal; Notable for the following components:   Glucose, Bld 136 (*)    BUN 109 (*)    Creatinine, Ser 6.31 (*)    GFR, Estimated 7 (*)    All other components within normal limits  GLUCOSE, CAPILLARY - Abnormal; Notable for the following components:   Glucose-Capillary 121 (*)    All other components within normal limits  SURGICAL PCR SCREEN     IMAGES:   EKG:   CV: Lexiscan  Nuclear stress test 12/17/2022: Non-diagnostic ECG stress. The heart rate response was consistent with Regadenoson.  Myocardial perfusion is abnormal. Soft tissue attenuation noted noted in the inferior wall.  Super imposed on this, there is a reversible moderate sized defect in the basal and mid inferior region.  LV is mildly dilated in both rest and stress images. Normal TID index.  Overall LV systolic function is abnormal with basal and mid inferior wall hypokinesis.  Stress LV EF low normal: 52%.  No previous exam available for comparison. Intermediate risk study.    Echocardiogram 11/18/2022:  Normal LV systolic function with visual EF 60-65%. Left ventricle cavity  is normal in size. Mild concentric hypertrophy of the left ventricle.  Normal global wall motion. Doppler evidence of grade I (impaired)  diastolic dysfunction, normal LAP.  Calculated EF 67%.  Right ventricle cavity is slightly dilated. Normal right ventricular  function.  Trileaflet aortic valve with no regurgitation. Mild aortic valve leaflet  thickening.  Structurally normal mitral valve.  Mild (Grade I) mitral regurgitation.  Structurally normal tricuspid valve with trace regurgitation. No evidence  of pulmonary hypertension.  Compared to 09/2017, diastolic dysfunction is now grade I (previously  grade II). Otherwise, no significant change.  Past  Medical History:  Diagnosis Date   Anemia    Arthritis    Asthma    Bradycardia    Chronic renal failure, stage 4 (severe) (HCC) 2017   no dialysis at this time, ESRD but not on HD yet   Complication of anesthesia    during anesthesia heart rate is low    Enlarged heart    Sees Dr. Jacinto Halim   Heart murmur    due to rheumatic fever - Sees Dr Jacinto Halim   Hemorrhoid    History of kidney stones    History of rheumatic fever    with a heart murmer   Hypertension    Ovarian cyst 1985   removed   Pneumonia    Pre-diabetes    no meds   Psoriasis    Sleep apnea    does not use Cpap (after weight loss no longer needed)    Past Surgical History:  Procedure Laterality Date   AV FISTULA PLACEMENT Left 05/24/2021   Procedure: LEFT ARM BRACHIOCEPHALIC ARTERIOVENOUS (AV) FISTULA CREATION;  Surgeon: Cephus Shelling, MD;  Location: [facility];  Service: Vascular;  Laterality: Left;   BASCILIC VEIN TRANSPOSITION Left 07/09/2022   Procedure: LEFT ARM  ARTERIOVENOUS FISTULA  CREATION;  Surgeon: Nada Libman, MD;  Location: [facility];  Service: Vascular;  Laterality: Left;   BASCILIC VEIN TRANSPOSITION Left 09/19/2022   Procedure: SECOND STAGE LEFT BASILIC VEIN TRANSPOSITION;  Surgeon: Nada Libman, MD;  [facility];  Service: Vascular;  Laterality: Left;   BUNIONECTOMY WITH HAMMERTOE RECONSTRUCTION Bilateral 1980's; 1999; 2009    left; right; left 'w/plates and screws"   COLONOSCOPY  2016   [facility] of colon cancer   COLONOSCOPY WITH PROPOFOL N/A 01/23/2023   Procedure: COLONOSCOPY WITH PROPOFOL;  Surgeon: Jeani Hawking, MD;  [facility];  Service: Gastroenterology;  Laterality: N/A;   DILATION AND CURETTAGE OF UTERUS  early 1980's   for missed AB   FISTULA SUPERFICIALIZATION Left 09/27/2021   Procedure: LEFT ARM ARTERIOVENOUS FISTULA REVISION AND SUPERFICIALIZATION;  Surgeon: Cephus Shelling, MD;  [facility];  Service: Vascular;  Laterality: Left;   LAPAROSCOPIC GASTRIC  BYPASS  2007   LAPAROSCOPIC OVARIAN CYSTECTOMY Right 1985   POLYPECTOMY  01/23/2023   Procedure: POLYPECTOMY;  Surgeon: Jeani Hawking, MD;  Location: [facility];  Service: Gastroenterology;;   REVERSE SHOULDER ARTHROPLASTY Left 11/22/2015   Procedure: LEFT REVERSE SHOULDER ARTHROPLASTY;  Surgeon: Francena Hanly, MD;  [facility];  Service: Orthopedics;  Laterality: Left;   ROTATOR CUFF REPAIR Right 2006    MEDICATIONS:  Alpha-Lipoic Acid 300 MG CAPS   amLODipine (NORVASC) 10 MG tablet   Blood Glucose Monitoring Suppl (GLUCOCOM BLOOD GLUCOSE MONITOR) DEVI   calcitRIOL (ROCALTROL) 0.5 MCG capsule   Calcium Carbonate Antacid (TUMS ULTRA 1000 PO)   Cholecalciferol (VITAMIN D3) 250 MCG (10000 UT) capsule   ferrous sulfate 325 (65 FE) MG tablet   folic acid (FOLVITE) 1 MG tablet   furosemide (LASIX) 40 MG tablet   labetalol (NORMODYNE) 200 MG tablet   Multiple  Vitamin (MULTIVITAMIN WITH MINERALS) TABS tablet   nitroGLYCERIN (NITROSTAT) 0.4 MG SL tablet   Omega 3 1000 MG CAPS   OVER THE COUNTER MEDICATION   sevelamer carbonate (RENVELA) 800 MG tablet   sodium bicarbonate 650 MG tablet   vitamin B-12 (CYANOCOBALAMIN) 1000 MCG tablet   vitamin C (ASCORBIC ACID) 500 MG tablet   No current facility-administered medications for this encounter.    Jodell Cipro Ward, PA-C [facility] [phone]

## 2023-05-05 NOTE — Anesthesia Preprocedure Evaluation (Addendum)
Anesthesia Evaluation  Patient identified by MRN, date of birth, ID band Patient awake    Reviewed: Allergy & Precautions, NPO status , Patient's Chart, lab work & pertinent test results  History of Anesthesia Complications (+) DIFFICULT IV STICK / SPECIAL LINE  Airway Mallampati: I  TM Distance: >3 FB Neck ROM: Full    Dental  (+) Dental Advisory Given, Missing   Pulmonary sleep apnea (no longer uses CPAP)    breath sounds clear to auscultation       Cardiovascular hypertension, Pt. on medications (-) angina  Rhythm:Regular Rate:Normal  Lexiscan  Nuclear stress test 12/17/2022: Non-diagnostic ECG stress. The heart rate response was consistent with Regadenoson. Myocardial perfusion is abnormal. Soft tissue attenuation noted noted in the inferior wall.  Super imposed on this, there is a reversible moderate sized defect in the basal and mid inferior region. LV is mildly dilated in both rest and stress images. Normal TID index. Overall LV systolic function is abnormal with basal and mid inferior wall hypokinesis.  Stress LV EF low normal: 52%.  Echocardiogram 11/18/2022: Normal LV systolic function, EF 60-65%. Left ventricle cavity is normal in size. Mild concentric hypertrophy of the left ventricle.Normal global wall motion. Doppler evidence of grade I (impaired) diastolic dysfunction, normal LAP. Calculated EF 67%. Right ventricle cavity is slightly dilated. Normal right ventricular function. Trileaflet aortic valve with no regurgitation. Mild aortic valve leaflet thickening. Structurally normal mitral valve.  Mild (Grade I) mitral regurgitation. Structurally normal tricuspid valve with trace regurgitation. No evidence of pulmonary hypertension.    Neuro/Psych negative neurological ROS     GI/Hepatic negative GI ROS, Neg liver ROS,,,  Endo/Other  diabetes (glu 108)  BMI 41.6  Renal/GU ESRF and DialysisRenal disease      Musculoskeletal   Abdominal  (+) + obese  Peds  Hematology Hb 11.4, plt 197k   Anesthesia Other Findings   Reproductive/Obstetrics                              Anesthesia Physical Anesthesia Plan  ASA: 3  Anesthesia Plan: General   Post-op Pain Management: Regional block* and Tylenol PO (pre-op)*   Induction: Intravenous  PONV Risk Score and Plan: 3 and Ondansetron, Dexamethasone and Treatment may vary due to age or medical condition  Airway Management Planned: Oral ETT  Additional Equipment: None  Intra-op Plan:   Post-operative Plan: Extubation in OR  Informed Consent: I have reviewed the patients History and Physical, chart, labs and discussed the procedure including the risks, benefits and alternatives for the proposed anesthesia with the patient or authorized representative who has indicated his/her understanding and acceptance.     Dental advisory given  Plan Discussed with: CRNA and Surgeon  Anesthesia Plan Comments: (See PAT note 05/01/2023 Plan routine monitors, GETA with interscalene block for post op analgesia)        Anesthesia Quick Evaluation

## 2023-05-12 NOTE — H&P (Signed)
PREOPERATIVE H&P  Chief Complaint: RIGHT SHOULDER DJD  HPI: Colleen Gray is a 72 y.o. female who presents for preoperative history and physical prior to scheduled surgery, Procedure(s): REVERSE SHOULDER ARTHROPLASTY.   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  She has a left sided fistula for dialysis.  She has had right sided pain for many months.  This was a non-traumatic event.  It is a dull pain, she thinks it is like a toothache.  She has limited range of motion.  She knows she tore her rotator cuff in the 1990s.  She is frustrated by her function and ability to use her arm.    Symptoms are rated as moderate to severe, and have been worsening.  This is significantly impairing activities of daily living.    Please see clinic note for further details on this patient's care.    She has elected for surgical management.   Past Medical History:  Diagnosis Date   Anemia    Arthritis    Asthma    Bradycardia    Chronic renal failure, stage 4 (severe) (HCC) 2017   no dialysis at this time, ESRD but not on HD yet   Complication of anesthesia    during anesthesia heart rate is low    Enlarged heart    Sees Dr. Jacinto Halim   Heart murmur    due to rheumatic fever - Sees Dr Jacinto Halim   Hemorrhoid    History of kidney stones    History of rheumatic fever    with a heart murmer   Hypertension    Ovarian cyst 1985   removed   Pneumonia    Pre-diabetes    no meds   Psoriasis    Sleep apnea    does not use Cpap (after weight loss no longer needed)   Past Surgical History:  Procedure Laterality Date   AV FISTULA PLACEMENT Left 05/24/2021   Procedure: LEFT ARM BRACHIOCEPHALIC ARTERIOVENOUS (AV) FISTULA CREATION;  Surgeon: Cephus Shelling, MD;  [facility];  Service: Vascular;  Laterality: Left;   BASCILIC VEIN TRANSPOSITION Left 07/09/2022   Procedure: LEFT ARM  ARTERIOVENOUS FISTULA  CREATION;  Surgeon: Nada Libman, MD;  [facility];  Service: Vascular;  Laterality: Left;   BASCILIC VEIN TRANSPOSITION Left 09/19/2022   Procedure: SECOND STAGE LEFT BASILIC VEIN TRANSPOSITION;  Surgeon: Nada Libman, MD;  [facility];  Service: Vascular;  Laterality: Left;   BUNIONECTOMY WITH HAMMERTOE RECONSTRUCTION Bilateral 1980's; 1999; 2009    left; right; left 'w/plates and screws"   COLONOSCOPY  2016   [facility] of colon cancer   COLONOSCOPY WITH PROPOFOL N/A 01/23/2023   Procedure: COLONOSCOPY WITH PROPOFOL;  Surgeon: Jeani Hawking, MD;  [facility];  Service: Gastroenterology;  Laterality: N/A;   DILATION AND CURETTAGE OF UTERUS  early 1980's   for missed AB   FISTULA SUPERFICIALIZATION Left 09/27/2021   Procedure: LEFT ARM ARTERIOVENOUS FISTULA REVISION AND SUPERFICIALIZATION;  Surgeon: Cephus Shelling, MD;  Location: [facility] OR;  Service: Vascular;  Laterality: Left;   LAPAROSCOPIC GASTRIC BYPASS  2007   LAPAROSCOPIC OVARIAN CYSTECTOMY Right 1985   POLYPECTOMY  01/23/2023   Procedure: POLYPECTOMY;  Surgeon: Jeani Hawking, MD;  Location: [facility];  Service: Gastroenterology;;   REVERSE SHOULDER ARTHROPLASTY Left 11/22/2015   Procedure: LEFT REVERSE SHOULDER ARTHROPLASTY;  Surgeon: Francena Hanly, MD;  [facility];  Service: Orthopedics;  Laterality: Left;  ROTATOR CUFF REPAIR Right 2005-06-04   Social History   Socioeconomic History   Marital status: Widowed    Spouse name: Not on file   Number of children: 0   Years of education: Not on file   Highest education level: Not on file  Occupational History   Not on file  Tobacco Use   Smoking status: Never   Smokeless tobacco: Never  Vaping Use   Vaping Use: Never used  Substance and Sexual Activity   Alcohol use: No    Alcohol/week: 0.0 standard drinks of alcohol   Drug use: No   Sexual activity: Yes    Partners: Male    Birth control/protection: Post-menopausal  Other Topics Concern   Not on file  Social History Narrative   Pt husband died in  June 04, 2012 from esophageal rupture.  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   Social Determinants of Health   Financial Resource Strain: Not on file  Food Insecurity: Not on file  Transportation Needs: Not on file  Physical Activity: Not on file  Stress: Not on file  Social Connections: Not on file   Family History  Problem Relation Age of Onset   Colon cancer Mother 17   Diabetes Mother    Hypertension Mother    Diabetes Father    Hypertension Father    Colon cancer Maternal Grandmother 44   Diabetes Paternal Grandmother    Diabetes Paternal Grandfather    Breast cancer Neg Hx    Allergies  Allergen Reactions   Nifedipine Swelling   Other     Surgery Glue caused blisters   Penicillins Rash   Prior to Admission medications   Medication Sig Start Date End Date Taking? Authorizing Provider  Alpha-Lipoic Acid 300 MG CAPS Take 300 mg by mouth daily.   Yes [provider]  amLODipine (NORVASC) 10 MG tablet Take 10 mg by mouth daily. 03/18/19  Yes [provider]  calcitRIOL (ROCALTROL) 0.5 MCG capsule Take 0.5 mcg by mouth daily. 06/25/19  Yes [provider]  Calcium Carbonate Antacid (TUMS ULTRA 1000 PO) Take 1,000 mg by mouth 3 (three) times daily.   Yes [provider]  Cholecalciferol (VITAMIN D3) 250 MCG (10000 UT) capsule Take 10,000 Units by mouth every Tuesday.   Yes [provider]  ferrous sulfate 325 (65 FE) MG tablet Take 325 mg by mouth daily with breakfast.   Yes [provider]  folic acid (FOLVITE) 1 MG tablet Take 1 mg by mouth daily. 06/29/19  Yes [provider]  furosemide (LASIX) 40 MG tablet Take 40 mg by mouth daily. 08/11/22  Yes [provider]  labetalol (NORMODYNE) 200 MG tablet Take 200 mg by mouth 2 (two) times daily. 06/09/19  Yes [provider]  Multiple Vitamin (MULTIVITAMIN  WITH MINERALS) TABS tablet Take 1 tablet by mouth daily.   Yes [provider]  nitroGLYCERIN (NITROSTAT) 0.4 MG SL tablet Place 0.4 mg under the tongue every 5 (five) minutes as needed for chest pain. 09/22/17  Yes [provider]  Omega 3 1000 MG CAPS Take 1,000 mg by mouth daily.   Yes [provider]  OVER THE COUNTER MEDICATION Take 1 capsule by mouth daily. Fruit and Vegetable Blend   Yes [provider]  sevelamer carbonate (RENVELA) 800 MG tablet Take 1,600 mg by mouth 3 (three) times daily with meals.  04/21/17  Yes [provider]  sodium bicarbonate 650 MG tablet Take 650 mg by mouth 3 (three) times daily.   Yes [provider]  vitamin B-12 (CYANOCOBALAMIN) 1000 MCG tablet Take 1,000 mcg by mouth every Monday, Wednesday, and Friday.   Yes [provider]  vitamin C (ASCORBIC ACID) 500 MG tablet Take 500 mg by mouth daily.   Yes [provider]  Blood Glucose Monitoring Suppl (GLUCOCOM BLOOD GLUCOSE MONITOR) DEVI Use daily to check blood sugar. 06/29/18   [provider]    ROS: All other systems have been reviewed and were otherwise negative with the exception of those mentioned in the HPI and as above.  Physical Exam: General: Alert, no acute distress Cardiovascular: No pedal edema Respiratory: No cyanosis, no use of accessory musculature GI: No organomegaly, abdomen is soft and non-tender Skin: No lesions in the area of chief complaint Neurologic: Sensation intact distally Psychiatric: Patient is competent for consent with normal mood and affect Lymphatic: No axillary or cervical lymphadenopathy  MUSCULOSKELETAL:  Active forward elevation to 30, passive to 40.  She has a fixed internal rotation contracture of 30 degrees.  She is not able to get past her back pocket.    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    BMI: Estimated body mass  index is 41.63 kg/m as calculated from the following:   Height as of 05/01/23: 5\' 3"  (1.6 m).   Weight as of 05/01/23: 106.6 kg.  Lab Results  Component Value Date   ALBUMIN 3.5 03/04/2021   Diabetes:   Patient has a diagnosis of diabetes,  Lab Results  Component Value Date   HGBA1C 5.9 (H) 05/01/2023   Smoking Status:   reports that she has never smoked. She has never used smokeless tobacco.    Assessment: RIGHT SHOULDER DJD  Plan: Plan for Procedure(s): REVERSE SHOULDER ARTHROPLASTY  The risks benefits and alternatives were discussed with the patient including but not limited to the risks of nonoperative treatment, versus surgical intervention including infection, bleeding, nerve injury,  blood clots, cardiopulmonary complications, morbidity, mortality, among others, and they were willing to proceed.   We additionally specifically discussed risks of axillary nerve injury, infection, periprosthetic fracture, continued pain and longevity of implants prior to beginning procedure.    Patient will be admitted for inpatient treatment for surgery, pain control, OT, prophylactic antibiotics, VTE prophylaxis, and discharge planning. The patient is planning to be discharged home with outpatient PT.   The patient acknowledged the explanation, agreed to proceed with the plan and consent was signed.   Patient received operative clearance from her PCP, DR. August Saucer, her nephrologist, Dr. Arrie Aran, her cardiologist, Dr. Melton Alar.   Operative Plan: Right reverse total shoulder arthroplasty Discharge Medications: standard (avoid NSAIDs) DVT Prophylaxis: Xarelto Physical Therapy: outpatient PT Special Discharge needs: Sling. IceMan   Vernetta Honey, PA-C  05/12/2023 4:05 PM

## 2023-05-13 ENCOUNTER — Ambulatory Visit (HOSPITAL_COMMUNITY): Payer: Medicare Other | Admitting: Physician Assistant

## 2023-05-13 ENCOUNTER — Inpatient Hospital Stay (HOSPITAL_COMMUNITY)
Admission: AD | Admit: 2023-05-13 | Discharge: 2023-05-19 | DRG: 483 | Disposition: A | Discharge status: Skilled Nursing Facility | Payer: Medicare Other | Attending: Orthopaedic Surgery | Admitting: Orthopaedic Surgery

## 2023-05-13 ENCOUNTER — Ambulatory Visit (HOSPITAL_BASED_OUTPATIENT_CLINIC_OR_DEPARTMENT_OTHER): Payer: Medicare Other | Admitting: Anesthesiology

## 2023-05-13 ENCOUNTER — Other Ambulatory Visit: Payer: Self-pay

## 2023-05-13 ENCOUNTER — Encounter (HOSPITAL_COMMUNITY): Payer: Self-pay | Admitting: Orthopaedic Surgery

## 2023-05-13 ENCOUNTER — Encounter (HOSPITAL_COMMUNITY)
Admission: AD | Disposition: A | Discharge status: Skilled Nursing Facility | Payer: Self-pay | Source: Home / Self Care | Attending: Orthopaedic Surgery

## 2023-05-13 ENCOUNTER — Ambulatory Visit (HOSPITAL_COMMUNITY): Payer: Medicare Other

## 2023-05-13 DIAGNOSIS — R011 Cardiac murmur, unspecified: Secondary | ICD-10-CM | POA: Diagnosis present

## 2023-05-13 DIAGNOSIS — M659 Synovitis and tenosynovitis, unspecified: Secondary | ICD-10-CM | POA: Diagnosis present

## 2023-05-13 DIAGNOSIS — M19011 Primary osteoarthritis, right shoulder: Secondary | ICD-10-CM | POA: Diagnosis not present

## 2023-05-13 DIAGNOSIS — Z79899 Other long term (current) drug therapy: Secondary | ICD-10-CM

## 2023-05-13 DIAGNOSIS — Z96619 Presence of unspecified artificial shoulder joint: Secondary | ICD-10-CM

## 2023-05-13 DIAGNOSIS — Z634 Disappearance and death of family member: Secondary | ICD-10-CM

## 2023-05-13 DIAGNOSIS — N186 End stage renal disease: Secondary | ICD-10-CM

## 2023-05-13 DIAGNOSIS — I517 Cardiomegaly: Secondary | ICD-10-CM | POA: Diagnosis present

## 2023-05-13 DIAGNOSIS — Z833 Family history of diabetes mellitus: Secondary | ICD-10-CM

## 2023-05-13 DIAGNOSIS — E119 Type 2 diabetes mellitus without complications: Secondary | ICD-10-CM

## 2023-05-13 DIAGNOSIS — I120 Hypertensive chronic kidney disease with stage 5 chronic kidney disease or end stage renal disease: Secondary | ICD-10-CM | POA: Diagnosis not present

## 2023-05-13 DIAGNOSIS — Z880 Allergy status to penicillin: Secondary | ICD-10-CM

## 2023-05-13 DIAGNOSIS — Z8701 Personal history of pneumonia (recurrent): Secondary | ICD-10-CM

## 2023-05-13 DIAGNOSIS — N184 Chronic kidney disease, stage 4 (severe): Secondary | ICD-10-CM | POA: Diagnosis present

## 2023-05-13 DIAGNOSIS — Z8249 Family history of ischemic heart disease and other diseases of the circulatory system: Secondary | ICD-10-CM

## 2023-05-13 DIAGNOSIS — I10 Essential (primary) hypertension: Secondary | ICD-10-CM | POA: Diagnosis present

## 2023-05-13 DIAGNOSIS — R7303 Prediabetes: Secondary | ICD-10-CM | POA: Diagnosis present

## 2023-05-13 DIAGNOSIS — Z87442 Personal history of urinary calculi: Secondary | ICD-10-CM

## 2023-05-13 DIAGNOSIS — Z96612 Presence of left artificial shoulder joint: Secondary | ICD-10-CM | POA: Diagnosis present

## 2023-05-13 DIAGNOSIS — Z888 Allergy status to other drugs, medicaments and biological substances status: Secondary | ICD-10-CM

## 2023-05-13 DIAGNOSIS — Z9109 Other allergy status, other than to drugs and biological substances: Secondary | ICD-10-CM

## 2023-05-13 DIAGNOSIS — G4730 Sleep apnea, unspecified: Secondary | ICD-10-CM | POA: Diagnosis present

## 2023-05-13 DIAGNOSIS — Z9884 Bariatric surgery status: Secondary | ICD-10-CM

## 2023-05-13 DIAGNOSIS — E1122 Type 2 diabetes mellitus with diabetic chronic kidney disease: Secondary | ICD-10-CM

## 2023-05-13 DIAGNOSIS — Z992 Dependence on renal dialysis: Secondary | ICD-10-CM

## 2023-05-13 DIAGNOSIS — Z7901 Long term (current) use of anticoagulants: Secondary | ICD-10-CM

## 2023-05-13 DIAGNOSIS — Z96611 Presence of right artificial shoulder joint: Principal | ICD-10-CM

## 2023-05-13 DIAGNOSIS — R252 Cramp and spasm: Secondary | ICD-10-CM | POA: Diagnosis present

## 2023-05-13 DIAGNOSIS — J45909 Unspecified asthma, uncomplicated: Secondary | ICD-10-CM | POA: Diagnosis present

## 2023-05-13 HISTORY — PX: REVERSE SHOULDER ARTHROPLASTY: SHX5054

## 2023-05-13 LAB — GLUCOSE, CAPILLARY
Glucose-Capillary: 108 mg/dL — ABNORMAL HIGH (ref 70–99)
Glucose-Capillary: 116 mg/dL — ABNORMAL HIGH (ref 70–99)

## 2023-05-13 SURGERY — ARTHROPLASTY, SHOULDER, TOTAL, REVERSE
Anesthesia: General | Site: Shoulder | Laterality: Right

## 2023-05-13 MED ORDER — BUPIVACAINE LIPOSOME 1.3 % IJ SUSP
INTRAMUSCULAR | Status: DC | PRN
  Administered 2023-05-13: 10 mL via PERINEURAL

## 2023-05-13 MED ORDER — PROPOFOL 10 MG/ML IV BOLUS
INTRAVENOUS | Status: DC | PRN
  Administered 2023-05-13: 100 mg via INTRAVENOUS

## 2023-05-13 MED ORDER — ATROPINE SULFATE 0.4 MG/ML IV SOLN
INTRAVENOUS | Status: DC | PRN
  Administered 2023-05-13: .2 mg via INTRAVENOUS

## 2023-05-13 MED ORDER — DIPHENHYDRAMINE HCL 12.5 MG/5ML PO ELIX: 12.5000 mg | ORAL_SOLUTION | ORAL | Status: DC | PRN

## 2023-05-13 MED ORDER — SODIUM BICARBONATE 650 MG PO TABS
650.0000 mg | ORAL_TABLET | Freq: Three times a day (TID) | ORAL | Status: DC
  Administered 2023-05-13 – 2023-05-19 (×18): 650 mg via ORAL
  Filled 2023-05-13 (×18): qty 1

## 2023-05-13 MED ORDER — METHOCARBAMOL 500 MG PO TABS
500.0000 mg | ORAL_TABLET | Freq: Four times a day (QID) | ORAL | Status: DC | PRN
  Administered 2023-05-14 – 2023-05-17 (×5): 500 mg via ORAL
  Filled 2023-05-13 (×5): qty 1

## 2023-05-13 MED ORDER — FUROSEMIDE 40 MG PO TABS
40.0000 mg | ORAL_TABLET | Freq: Every day | ORAL | Status: DC
  Administered 2023-05-14 – 2023-05-19 (×6): 40 mg via ORAL
  Filled 2023-05-13 (×6): qty 1

## 2023-05-13 MED ORDER — MIDAZOLAM HCL 2 MG/2ML IJ SOLN
INTRAMUSCULAR | Status: AC
  Administered 2023-05-13: 1 mg
  Filled 2023-05-13: qty 2

## 2023-05-13 MED ORDER — MENTHOL 3 MG MT LOZG: 1.0000 | LOZENGE | OROMUCOSAL | Status: DC | PRN

## 2023-05-13 MED ORDER — ROCURONIUM BROMIDE 100 MG/10ML IV SOLN
INTRAVENOUS | Status: DC | PRN
  Administered 2023-05-13: 50 mg via INTRAVENOUS
  Administered 2023-05-13: 15 mg via INTRAVENOUS

## 2023-05-13 MED ORDER — EPHEDRINE SULFATE (PRESSORS) 50 MG/ML IJ SOLN
INTRAMUSCULAR | Status: DC | PRN
  Administered 2023-05-13: 10 mg via INTRAVENOUS
  Administered 2023-05-13 (×7): 5 mg via INTRAVENOUS

## 2023-05-13 MED ORDER — FENTANYL CITRATE PF 50 MCG/ML IJ SOSY
50.0000 ug | PREFILLED_SYRINGE | INTRAMUSCULAR | Status: AC
  Administered 2023-05-13: 50 ug via INTRAVENOUS

## 2023-05-13 MED ORDER — HYDROMORPHONE HCL 1 MG/ML IJ SOLN: 0.2500 mg | INTRAMUSCULAR | Status: DC | PRN

## 2023-05-13 MED ORDER — FENTANYL CITRATE PF 50 MCG/ML IJ SOSY
PREFILLED_SYRINGE | INTRAMUSCULAR | Status: AC
  Filled 2023-05-13: qty 1

## 2023-05-13 MED ORDER — LIDOCAINE HCL (CARDIAC) PF 100 MG/5ML IV SOSY
PREFILLED_SYRINGE | INTRAVENOUS | Status: DC | PRN
  Administered 2023-05-13: 50 mg via INTRAVENOUS

## 2023-05-13 MED ORDER — ONDANSETRON HCL 4 MG/2ML IJ SOLN: 4.0000 mg | Freq: Four times a day (QID) | INTRAMUSCULAR | Status: DC | PRN

## 2023-05-13 MED ORDER — ORAL CARE MOUTH RINSE: 15.0000 mL | Freq: Once | OROMUCOSAL | Status: AC

## 2023-05-13 MED ORDER — LABETALOL HCL 100 MG PO TABS
200.0000 mg | ORAL_TABLET | Freq: Two times a day (BID) | ORAL | Status: DC
  Administered 2023-05-13 – 2023-05-19 (×11): 200 mg via ORAL
  Filled 2023-05-13 (×12): qty 2

## 2023-05-13 MED ORDER — CALCITRIOL 0.5 MCG PO CAPS
0.5000 ug | ORAL_CAPSULE | Freq: Every day | ORAL | Status: DC
  Administered 2023-05-14 – 2023-05-19 (×6): 0.5 ug via ORAL
  Filled 2023-05-13 (×6): qty 1

## 2023-05-13 MED ORDER — POLYETHYLENE GLYCOL 3350 17 G PO PACK: 17.0000 g | PACK | Freq: Every day | ORAL | Status: DC | PRN

## 2023-05-13 MED ORDER — PHENYLEPHRINE HCL-NACL 20-0.9 MG/250ML-% IV SOLN
INTRAVENOUS | Status: DC | PRN
  Administered 2023-05-13: 40 ug/min via INTRAVENOUS

## 2023-05-13 MED ORDER — ONDANSETRON HCL 4 MG PO TABS
4.0000 mg | ORAL_TABLET | Freq: Four times a day (QID) | ORAL | Status: DC | PRN
  Filled 2023-05-13: qty 1

## 2023-05-13 MED ORDER — METHOCARBAMOL 1000 MG/10ML IJ SOLN: 500.0000 mg | Freq: Four times a day (QID) | INTRAVENOUS | Status: DC | PRN

## 2023-05-13 MED ORDER — OXYCODONE HCL 5 MG PO TABS: 5.0000 mg | ORAL_TABLET | Freq: Once | ORAL | Status: DC | PRN

## 2023-05-13 MED ORDER — GLYCOPYRROLATE 0.2 MG/ML IJ SOLN
INTRAMUSCULAR | Status: DC | PRN
  Administered 2023-05-13: .2 mg via INTRAVENOUS

## 2023-05-13 MED ORDER — CEFAZOLIN SODIUM-DEXTROSE 1-4 GM/50ML-% IV SOLN
1.0000 g | Freq: Four times a day (QID) | INTRAVENOUS | Status: AC
  Administered 2023-05-13 – 2023-05-14 (×3): 1 g via INTRAVENOUS
  Filled 2023-05-13 (×3): qty 50

## 2023-05-13 MED ORDER — LACTATED RINGERS IV SOLN: INTRAVENOUS | Status: DC

## 2023-05-13 MED ORDER — EPHEDRINE 5 MG/ML INJ
INTRAVENOUS | Status: AC
  Filled 2023-05-13: qty 10

## 2023-05-13 MED ORDER — CEFAZOLIN SODIUM-DEXTROSE 2-4 GM/100ML-% IV SOLN
2.0000 g | INTRAVENOUS | Status: AC
  Administered 2023-05-13: 2 g via INTRAVENOUS
  Filled 2023-05-13: qty 100

## 2023-05-13 MED ORDER — FENTANYL CITRATE (PF) 100 MCG/2ML IJ SOLN
INTRAMUSCULAR | Status: AC
  Filled 2023-05-13: qty 2

## 2023-05-13 MED ORDER — FERROUS SULFATE 325 (65 FE) MG PO TABS
325.0000 mg | ORAL_TABLET | Freq: Every day | ORAL | Status: DC
  Administered 2023-05-14 – 2023-05-19 (×6): 325 mg via ORAL
  Filled 2023-05-13 (×6): qty 1

## 2023-05-13 MED ORDER — SODIUM CHLORIDE 0.9 % IR SOLN
Status: DC | PRN
  Administered 2023-05-13: 1000 mL

## 2023-05-13 MED ORDER — MIDAZOLAM HCL 2 MG/2ML IJ SOLN: 0.5000 mg | Freq: Once | INTRAMUSCULAR | Status: DC | PRN

## 2023-05-13 MED ORDER — DEXAMETHASONE SODIUM PHOSPHATE 10 MG/ML IJ SOLN
INTRAMUSCULAR | Status: DC | PRN
  Administered 2023-05-13: 4 mg via INTRAVENOUS

## 2023-05-13 MED ORDER — HYDROMORPHONE HCL 1 MG/ML IJ SOLN: 0.5000 mg | INTRAMUSCULAR | Status: DC | PRN

## 2023-05-13 MED ORDER — VANCOMYCIN HCL 1 G IV SOLR
INTRAVENOUS | Status: DC | PRN
  Administered 2023-05-13: 1000 mg via TOPICAL

## 2023-05-13 MED ORDER — FENTANYL CITRATE (PF) 100 MCG/2ML IJ SOLN
INTRAMUSCULAR | Status: DC | PRN
  Administered 2023-05-13: 50 ug via INTRAVENOUS

## 2023-05-13 MED ORDER — MIDAZOLAM HCL 2 MG/2ML IJ SOLN
INTRAMUSCULAR | Status: AC
  Filled 2023-05-13: qty 2

## 2023-05-13 MED ORDER — ACETAMINOPHEN 500 MG PO TABS
1000.0000 mg | ORAL_TABLET | Freq: Once | ORAL | Status: AC
  Administered 2023-05-13: 1000 mg via ORAL
  Filled 2023-05-13: qty 2

## 2023-05-13 MED ORDER — BUPIVACAINE HCL (PF) 0.5 % IJ SOLN
INTRAMUSCULAR | Status: DC | PRN
  Administered 2023-05-13: 10 mL via PERINEURAL

## 2023-05-13 MED ORDER — PROMETHAZINE HCL 25 MG/ML IJ SOLN: 6.2500 mg | INTRAMUSCULAR | Status: DC | PRN

## 2023-05-13 MED ORDER — 0.9 % SODIUM CHLORIDE (POUR BTL) OPTIME
TOPICAL | Status: DC | PRN
  Administered 2023-05-13: 1000 mL

## 2023-05-13 MED ORDER — OXYCODONE HCL 5 MG/5ML PO SOLN: 5.0000 mg | Freq: Once | ORAL | Status: DC | PRN

## 2023-05-13 MED ORDER — DOCUSATE SODIUM 100 MG PO CAPS
100.0000 mg | ORAL_CAPSULE | Freq: Two times a day (BID) | ORAL | Status: DC
  Administered 2023-05-13 – 2023-05-19 (×11): 100 mg via ORAL
  Filled 2023-05-13 (×12): qty 1

## 2023-05-13 MED ORDER — CHLORHEXIDINE GLUCONATE 0.12 % MT SOLN
15.0000 mL | Freq: Once | OROMUCOSAL | Status: AC
  Administered 2023-05-13: 15 mL via OROMUCOSAL

## 2023-05-13 MED ORDER — ONDANSETRON HCL 4 MG/2ML IJ SOLN
INTRAMUSCULAR | Status: AC
  Filled 2023-05-13: qty 4

## 2023-05-13 MED ORDER — OXYCODONE HCL 5 MG PO TABS
5.0000 mg | ORAL_TABLET | ORAL | Status: DC | PRN
  Administered 2023-05-15 (×3): 5 mg via ORAL
  Filled 2023-05-13 (×4): qty 1

## 2023-05-13 MED ORDER — OXYCODONE HCL 5 MG PO TABS
10.0000 mg | ORAL_TABLET | ORAL | Status: DC | PRN
  Administered 2023-05-14 – 2023-05-16 (×4): 10 mg via ORAL
  Filled 2023-05-13 (×4): qty 2

## 2023-05-13 MED ORDER — BISACODYL 10 MG RE SUPP: 10.0000 mg | Freq: Every day | RECTAL | Status: DC | PRN

## 2023-05-13 MED ORDER — VANCOMYCIN HCL 1000 MG IV SOLR
INTRAVENOUS | Status: AC
  Filled 2023-05-13: qty 20

## 2023-05-13 MED ORDER — ACETAMINOPHEN 500 MG PO TABS
1000.0000 mg | ORAL_TABLET | Freq: Four times a day (QID) | ORAL | Status: AC
  Administered 2023-05-13 – 2023-05-14 (×4): 1000 mg via ORAL
  Filled 2023-05-13 (×4): qty 2

## 2023-05-13 MED ORDER — MEPERIDINE HCL 50 MG/ML IJ SOLN: 6.2500 mg | INTRAMUSCULAR | Status: DC | PRN

## 2023-05-13 MED ORDER — TRANEXAMIC ACID-NACL 1000-0.7 MG/100ML-% IV SOLN
1000.0000 mg | INTRAVENOUS | Status: AC
  Administered 2023-05-13: 1000 mg via INTRAVENOUS
  Filled 2023-05-13: qty 100

## 2023-05-13 MED ORDER — SEVELAMER CARBONATE 800 MG PO TABS
1600.0000 mg | ORAL_TABLET | Freq: Three times a day (TID) | ORAL | Status: DC
  Administered 2023-05-13 – 2023-05-19 (×16): 1600 mg via ORAL
  Filled 2023-05-13 (×19): qty 2

## 2023-05-13 MED ORDER — SODIUM CHLORIDE 0.9 % IV SOLN: INTRAVENOUS | Status: DC

## 2023-05-13 MED ORDER — MIDAZOLAM HCL 5 MG/5ML IJ SOLN
INTRAMUSCULAR | Status: DC | PRN
  Administered 2023-05-13: 1 mg via INTRAVENOUS

## 2023-05-13 MED ORDER — SUGAMMADEX SODIUM 200 MG/2ML IV SOLN
INTRAVENOUS | Status: DC | PRN
  Administered 2023-05-13: 50 mg via INTRAVENOUS
  Administered 2023-05-13: 200 mg via INTRAVENOUS

## 2023-05-13 MED ORDER — AMLODIPINE BESYLATE 10 MG PO TABS
10.0000 mg | ORAL_TABLET | Freq: Every day | ORAL | Status: DC
  Administered 2023-05-14 – 2023-05-19 (×6): 10 mg via ORAL
  Filled 2023-05-13 (×6): qty 1

## 2023-05-13 MED ORDER — STERILE WATER FOR IRRIGATION IR SOLN
Status: DC | PRN
  Administered 2023-05-13: 2000 mL

## 2023-05-13 MED ORDER — NITROGLYCERIN 0.4 MG SL SUBL: 0.4000 mg | SUBLINGUAL_TABLET | SUBLINGUAL | Status: DC | PRN

## 2023-05-13 MED ORDER — PROPOFOL 10 MG/ML IV BOLUS
INTRAVENOUS | Status: AC
  Filled 2023-05-13: qty 20

## 2023-05-13 MED ORDER — PHENOL 1.4 % MT LIQD: 1.0000 | OROMUCOSAL | Status: DC | PRN

## 2023-05-13 SURGICAL SUPPLY — 68 items
AID PSTN UNV HD RSTRNT DISP (MISCELLANEOUS) ×1
APL PRP STRL LF DISP 70% ISPRP (MISCELLANEOUS) ×2
AUG BASEPLATE 15DEG 25 WEDGE (Joint) ×1 IMPLANT
AUGMENT BASEPLATE 15DEG 25 WDG (Joint) IMPLANT
BAG COUNTER SPONGE SURGICOUNT (BAG) ×1 IMPLANT
BAG SPNG CNTER NS LX DISP (BAG) ×1
BIT DRILL 3.2 PERIPHERAL SCREW (BIT) IMPLANT
BLADE SAW SGTL 73X25 THK (BLADE) ×1 IMPLANT
BSPLAT GLND 15D 25 FULL WDG (Joint) ×1 IMPLANT
CHLORAPREP W/TINT 26 (MISCELLANEOUS) ×2 IMPLANT
CLSR STERI-STRIP ANTIMIC 1/2X4 (GAUZE/BANDAGES/DRESSINGS) ×1 IMPLANT
COOLER ICEMAN CLASSIC (MISCELLANEOUS) IMPLANT
COVER BACK TABLE 60X90IN (DRAPES) ×1 IMPLANT
COVER SURGICAL LIGHT HANDLE (MISCELLANEOUS) ×1 IMPLANT
DRAPE C-ARM 42X120 X-RAY (DRAPES) IMPLANT
DRAPE INCISE IOBAN 66X45 STRL (DRAPES) ×1 IMPLANT
DRAPE ORTHO SPLIT 77X108 STRL (DRAPES) ×2
DRAPE POUCH INSTRU U-SHP 10X18 (DRAPES) ×1 IMPLANT
DRAPE SHEET LG 3/4 BI-LAMINATE (DRAPES) ×2 IMPLANT
DRAPE SURG ORHT 6 SPLT 77X108 (DRAPES) ×2 IMPLANT
DRSG AQUACEL AG ADV 3.5X 6 (GAUZE/BANDAGES/DRESSINGS) ×1 IMPLANT
ELECT BLADE TIP CTD 4 INCH (ELECTRODE) ×1 IMPLANT
ELECT PENCIL ROCKER SW 15FT (MISCELLANEOUS) IMPLANT
ELECT REM PT RETURN 15FT ADLT (MISCELLANEOUS) ×1 IMPLANT
FACESHIELD WRAPAROUND (MASK) ×2 IMPLANT
FACESHIELD WRAPAROUND OR TEAM (MASK) ×2 IMPLANT
GAUZE XEROFORM 1X8 LF (GAUZE/BANDAGES/DRESSINGS) IMPLANT
GLENOSPHERE STANDARD 39 (Joint) ×1 IMPLANT
GLENOSPHERE STD 39 (Joint) IMPLANT
GLOVE BIO SURGEON STRL SZ 6.5 (GLOVE) ×2 IMPLANT
GLOVE BIOGEL PI IND STRL 6.5 (GLOVE) ×1 IMPLANT
GLOVE BIOGEL PI IND STRL 8 (GLOVE) ×1 IMPLANT
GLOVE ECLIPSE 8.0 STRL XLNG CF (GLOVE) ×2 IMPLANT
GOWN STRL REUS W/ TWL LRG LVL3 (GOWN DISPOSABLE) ×2 IMPLANT
GOWN STRL REUS W/TWL LRG LVL3 (GOWN DISPOSABLE) ×2
GUIDE PIN 3X75 SHOULDER (PIN) ×1
GUIDEWIRE GLENOID 2.5X220 (WIRE) IMPLANT
HANDPIECE INTERPULSE COAX TIP (DISPOSABLE) ×1
INSERT REVS RETENTIVE SZ 1/2 (Orthopedic Implant) IMPLANT
KIT BASIN OR (CUSTOM PROCEDURE TRAY) ×1 IMPLANT
KIT STABILIZATION SHOULDER (MISCELLANEOUS) ×1 IMPLANT
KIT TURNOVER KIT A (KITS) IMPLANT
MANIFOLD NEPTUNE II (INSTRUMENTS) ×1 IMPLANT
NS IRRIG 1000ML POUR BTL (IV SOLUTION) ×1 IMPLANT
PACK SHOULDER (CUSTOM PROCEDURE TRAY) ×1 IMPLANT
PAD COLD SHLDR WRAP-ON (PAD) IMPLANT
PIN GUIDE 3X75 SHOULDER (PIN) IMPLANT
RESTRAINT HEAD UNIVERSAL NS (MISCELLANEOUS) ×1 IMPLANT
SCREW 5.0X18 (Screw) IMPLANT
SCREW 5.0X38 SMALL F/PERFORM (Screw) IMPLANT
SCREW BONE INTRNL SM 7 (Screw) IMPLANT
SET HNDPC FAN SPRY TIP SCT (DISPOSABLE) ×1 IMPLANT
SLING ARM IMMOBILIZER XL (CAST SUPPLIES) IMPLANT
SPONGE T-LAP 4X18 ~~LOC~~+RFID (SPONGE) ×1 IMPLANT
STEM HUMERAL STD SHORT SIZE 2 (Orthopedic Implant) IMPLANT
SUCTION FRAZIER HANDLE 12FR (TUBING)
SUCTION TUBE FRAZIER 12FR DISP (TUBING) IMPLANT
SUT ETHIBOND 2 V 37 (SUTURE) ×1 IMPLANT
SUT ETHIBOND NAB CT1 #1 30IN (SUTURE) ×1 IMPLANT
SUT FIBERWIRE #5 38 CONV NDL (SUTURE) ×2
SUT MNCRL AB 4-0 PS2 18 (SUTURE) ×1 IMPLANT
SUT VIC AB 0 CT1 36 (SUTURE) IMPLANT
SUT VIC AB 3-0 SH 27 (SUTURE) ×1
SUT VIC AB 3-0 SH 27X BRD (SUTURE) ×1 IMPLANT
SUTURE FIBERWR #5 38 CONV NDL (SUTURE) ×2 IMPLANT
TOWEL OR 17X26 10 PK STRL BLUE (TOWEL DISPOSABLE) ×1 IMPLANT
TUBE SUCTION HIGH CAP CLEAR NV (SUCTIONS) ×1 IMPLANT
WATER STERILE IRR 1000ML POUR (IV SOLUTION) ×2 IMPLANT

## 2023-05-13 NOTE — Transfer of Care (Signed)
Immediate Anesthesia Transfer of Care Note  Patient: Colleen Gray  Procedure(s) Performed: REVERSE SHOULDER ARTHROPLASTY (Right: Shoulder)  Patient Location: PACU  Anesthesia Type:General  Level of Consciousness: awake, alert , oriented, and patient cooperative  Airway & Oxygen Therapy: Patient Spontanous Breathing and Patient connected to face mask oxygen  Post-op Assessment: Report given to RN and Post -op Vital signs reviewed and stable  Post vital signs: Reviewed and stable  Last Vitals:  Vitals Value Taken Time  BP 137/62 05/13/23 1118  Temp    Pulse 59 05/13/23 1122  Resp 14 05/13/23 1122  SpO2 98 % 05/13/23 1122  Vitals shown include unvalidated device data.  Last Pain:  Vitals:   05/13/23 0825  TempSrc: Oral  PainSc:       Patients Stated Pain Goal: 4 (05/13/23 0819)  Complications: No notable events documented.

## 2023-05-13 NOTE — Anesthesia Procedure Notes (Signed)
Procedure Name: Intubation Date/Time: 05/13/2023 9:46 AM  Performed by: Garth Bigness, CRNAPre-anesthesia Checklist: Patient identified, Emergency Drugs available, Suction available and Patient being monitored Patient Re-evaluated:Patient Re-evaluated prior to induction Oxygen Delivery Method: Circle system utilized Preoxygenation: Pre-oxygenation with 100% oxygen Induction Type: IV induction Ventilation: Mask ventilation without difficulty Laryngoscope Size: Mac and 3 Grade View: Grade I Tube type: Oral Tube size: 7.0 mm Number of attempts: 1 Airway Equipment and Method: Stylet and Oral airway Placement Confirmation: ETT inserted through vocal cords under direct vision, positive ETCO2 and breath sounds checked- equal and bilateral Secured at: 21 cm Tube secured with: Tape Dental Injury: Teeth and Oropharynx as per pre-operative assessment

## 2023-05-13 NOTE — Interval H&P Note (Signed)
All questions answered

## 2023-05-13 NOTE — Plan of Care (Signed)
  Problem: Activity: Goal: Ability to tolerate increased activity will improve Outcome: Progressing   Problem: Pain Management: Goal: Pain level will decrease with appropriate interventions Outcome: Progressing   Problem: Elimination: Goal: Will not experience complications related to bowel motility Outcome: Progressing

## 2023-05-13 NOTE — Anesthesia Procedure Notes (Signed)
Anesthesia Regional Block: Interscalene brachial plexus block   Pre-Anesthetic Checklist: , timeout performed,  Correct Patient, Correct Site, Correct Laterality,  Correct Procedure, Correct Position, site marked,  Risks and benefits discussed,  Surgical consent,  Pre-op evaluation,  At surgeon's request and post-op pain management  Laterality: Right and Upper  Prep: chloraprep       Needles:  Injection technique: Single-shot  Needle Type: Echogenic Needle     Needle Length: 9cm  Needle Gauge: 21     Additional Needles:   Procedures:,,,, ultrasound used (permanent image in chart),,    Narrative:  Start time: 05/13/2023 9:06 AM End time: 05/13/2023 9:15 AM Injection made incrementally with aspirations every 5 mL.  Performed by: Personally  Anesthesiologist: Jairo Ben, MD  Additional Notes: Pt identified in Holding room.  Monitors applied. Working IV access confirmed. Timeout, Sterile prep.  #21ga ECHOgenic Arrow block needle to interscalene brachial plexus with US guidance.  10cc 0.5% Bupivacaine with Exparel injected incrementally after negative test dose.  Patient asymptomatic, VSS, no heme aspirated, tolerated well.   Sandford Craze, MD

## 2023-05-13 NOTE — Op Note (Addendum)
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  Patient's glenoid was badly eroded and was nearly in need of custom implants.  We are able to get reasonable fit with a short ingrowth post but a central screw was unable to be performed secondary to the lack of vault.  We struggled a bit with tension as the patient had significant proximal migration.  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  We had to cut most of this away in order to obtain reasonable fit with our size 1 implant.  Longstem implants were difficult to use because of the mismatch in the size of the proximal canal and the proximal humeral head.  Post-operative plan: The patient will be NWB in sling.  The patient will be will be discharged from PACU if continues to be stable as was plan prior to surgery.  DVT prophylaxis Xarelto 10 mg/day.  Pain control with PRN pain medication preferring oral medicines.  Follow up plan will be scheduled in approximately 7 days for incision check and XR.  Physical therapy to start after 4 weeks.  Implants: Tornier perform humeral size 2 stem, 0 retentive polyethylene, 39 standard glenosphere with a 25 full wedge baseplate, short center post and 4 peripheral screws  Post-Op Diagnosis: Same Surgeons:Primary: Bjorn Pippin, MD Assistants:Caroline McBane PA-C Location: [facility] ROOM 06 Anesthesia: General with Exparel Interscalene Antibiotics: Ancef 2g preop, Vancomycin 1000mg  locally Tourniquet time: None Estimated Blood Loss: 100 Complications: None Specimens: None Implants: Implant Name Type Inv. Item Serial No. Manufacturer Lot No. LRB No. Used Action  GLENOSPHERE  STANDARD 39 - [id number] Joint GLENOSPHERE STANDARD 39 [id number] [employer]  Right 1 Implanted  AUG BASEPLATE 15DEG 25 WEDGE - [id number] Joint AUG BASEPLATE 15DEG 25 WEDGE [id number] [employer]  Right 1 Implanted  SCREW BONE INTRNL SM 7 - [id number] Screw SCREW BONE INTRNL SM 7 [id number] [employer]  Right 1 Implanted  SCREW 5.0X18 - [id number] Screw SCREW 5.0X18  [employer]  Right 3 Implanted  SCREW 5.0X38 SMALL F/PERFORM - [id number] Screw SCREW 5.0X38 SMALL F/PERFORM  [employer]  Right 1 Implanted  INSERT REVS RETENTIVE SZ 1/2 - [id number] Orthopedic Implant INSERT REVS RETENTIVE SZ 1/2 [id number] [employer]  Right 1 Implanted  STEM HUMERAL STD SHORT SIZE 2 - [id number] Orthopedic Implant STEM HUMERAL STD SHORT SIZE 2 [id number] [employer]  Right 1 Implanted    Indications for Surgery:   Colleen Gray is a 72 y.o. female with erosive end-stage arthrosis in the setting of chronic kidney disease.  Benefits and risks of operative and nonoperative management were discussed prior to surgery with patient/guardian(s) and informed consent form was completed.  Infection and need for further surgery were discussed as was prosthetic stability and cuff issues.  We additionally specifically discussed risks of axillary nerve injury, infection, periprosthetic fracture, continued pain and longevity of implants prior to beginning procedure.      Procedure:   The patient was identified in the preoperative holding area where the surgical site was marked. Block placed by anesthesia with exparel.  The patient was taken to the OR where a procedural timeout was called and the above noted anesthesia was induced.  The patient was positioned beachchair on Orange  table with spider arm positioner.  Preoperative antibiotics were dosed.  The patient's right shoulder was prepped and draped in the usual sterile fashion.  A second preoperative timeout was called.       Standard deltopectoral approach was performed with a  #10 blade. We dissected down to the subcutaneous tissues and the cephalic vein was taken laterally with the deltoid. Clavipectoral fascia was incised in line with the incision. Deep retractors were placed. The long of the biceps tendon was identified and there was significant tenosynovitis present.  Tenodesis was performed to the pectoralis tendon with #2 Ethibond. The remaining biceps was followed up into the rotator interval where it was released.   The subscapularis was taken down in a full thickness layer with capsule along the humeral neck extending inferiorly around the humeral head. We continued releasing the capsule directly off of the osteophytes inferiorly all the way around the corner. This allowed [place] to dislocate the humeral head.   The humeral head had evidence of severe osteoarthritic wear with full-thickness cartilage loss and exposed subchondral bone. There was significant flattening of the humeral head.   The rotator cuff was carefully examined and noted to be irreperably torn.  The decision was confirmed that a reverse total shoulder was indicated for this patient.  There were osteophytes along the inferior humeral neck. The osteophytes were removed with an osteotome and a rongeur.  Osteophytes were removed with a rongeur and an osteotome and the anatomic neck was well visualized.     A humeral cutting guide was used extra medullary with a pin to help control version. The version was set at 20 of retroversion. Humeral osteotomy was performed with an oscillating saw. The head fragment was passed off the back table.  A cut protector plate was placed.  The subscapularis was again identified and immediately we took care to palpate the axillary nerve anteriorly and verify its position with gentle palpation as well as the tug test.  We then released the SGHL with bovie cautery prior to placing a curved mayo at the junction of the anterior glenoid well above the axillary nerve and bluntly  dissecting the subscapularis from the capsule.  We then carefully protected the axillary nerve as we gently released the inferior capsule to fully mobilize the subscapularis.  An anterior deltoid retractor was then placed as well as a small Hohmann retractor superiorly.   The glenoid was inspected and had evidence of severe osteoarthritic wear with full-thickness cartilage loss and exposed subchondral bone.   The glenoid was relatively intact in the setting of an irreparable cuff tear  The remaining labrum was removed circumferentially taking great care not to disrupt the posterior capsule.   At this point we felt based on blueprint templating that a full wedge augment was necessary.  We began by using a full wedge guide to place our center pin as was templated.  We had good position of this pin and we proceeded with our starter center drill.  This allowed for [place] to use the 15 degree full wedge reamer obtaining circumferential witness marks and good bone preparation for ingrowth.  At this point we proceeded with our center drill and had an intact vault.  We then drilled our center screw to a length of 35 mm.   We initially attempted to use a tap but the center vault was not adequate for screw fixation.  We thus felt it was appropriate to use a short post.  We used a short  post and had good fit and fixation.  We double checked that we had good apposition of the base plate to bone and then proceeded to place 3 locking screws and one nonlocking screw as is typical.   We turned attention back to the humeral side. The cut protector was removed.  We used the perform humeral sizing block to select the appropriate size which for this patient was a 2.  We then placed our center pin and reamed over it concentrically obtaining appropriate inset.  We then used our lateralizing chisel to prepare the lateral aspect of the humerus.  At that point we selected the appropriate implant trialing a 2.  Unfortunately the  patient's construct was very tight and we worried about stress fracture.  We resected a small amount of additional bone and then sized again using a size 1 standard implant.  We attempted to use a 1 long however the distal fit meant that the stem sat to proud.  We resected additional bone and placed a 2 standard stem.  Using this trial implant we trialed multiple polyethylene sizes settling on a 0 retentive which provided good stability and range of motion without excess soft tissue tension. The offset was dialed in to match the normal anatomy. The shoulder was trialed.  There was good ROM in all planes and the shoulder was stable with no inferior translation.  The real humeral implants were opened after again confirming sizes.  The trial was removed. #5 Fiberwire x4 sutures passed through the humeral neck for subscap repair. The humeral component was press-fit obtaining a secure fit. The joint was reduced and thoroughly irrigated with pulsatile lavage. Subscap was repaired back with #5 Fiberwire sutures through bone tunnels. Hemostasis was obtained. The deltopectoral interval was reapproximated with #1 Ethibond. The subcutaneous tissues were closed with 2-0 Vicryl and the skin was closed with running nylon.    The wounds were cleaned and dried and an Aquacel dressing was placed. The drapes taken down. The arm was placed into sling with abduction pillow. Patient was awakened, extubated, and transferred to the recovery room in stable condition. There were no intraoperative complications. The sponge, needle, and attention counts were  correct at the end of the case.     Alfonse Alpers, PA-C, present and scrubbed throughout the case, critical for completion in a timely fashion, and for retraction, instrumentation, closure.

## 2023-05-13 NOTE — Anesthesia Postprocedure Evaluation (Signed)
Anesthesia Post Note  Patient: Colleen Gray  Procedure(s) Performed: REVERSE SHOULDER ARTHROPLASTY (Right: Shoulder)     Patient location during evaluation: Nursing Unit Anesthesia Type: General Level of consciousness: awake and alert, patient cooperative and oriented Pain management: pain level controlled Vital Signs Assessment: post-procedure vital signs reviewed and stable Respiratory status: nonlabored ventilation, spontaneous breathing and respiratory function stable Cardiovascular status: blood pressure returned to baseline and stable Postop Assessment: no apparent nausea or vomiting and adequate PO intake Anesthetic complications: no   No notable events documented.  Last Vitals:  Vitals:   05/13/23 1234 05/13/23 1444  BP: (!) 157/71 (!) 173/65  Pulse: 62 67  Resp: 16 16  Temp:  36.7 C  SpO2: 96% 100%    Last Pain:  Vitals:   05/13/23 1444  TempSrc: Oral  PainSc:                  Evadene Wardrip,E. Libia Fazzini

## 2023-05-14 ENCOUNTER — Other Ambulatory Visit (HOSPITAL_COMMUNITY): Payer: Self-pay

## 2023-05-14 DIAGNOSIS — Z8701 Personal history of pneumonia (recurrent): Secondary | ICD-10-CM | POA: Diagnosis not present

## 2023-05-14 DIAGNOSIS — Z96619 Presence of unspecified artificial shoulder joint: Secondary | ICD-10-CM

## 2023-05-14 DIAGNOSIS — R252 Cramp and spasm: Secondary | ICD-10-CM | POA: Diagnosis present

## 2023-05-14 DIAGNOSIS — Z87442 Personal history of urinary calculi: Secondary | ICD-10-CM | POA: Diagnosis not present

## 2023-05-14 DIAGNOSIS — R7303 Prediabetes: Secondary | ICD-10-CM | POA: Diagnosis present

## 2023-05-14 DIAGNOSIS — M659 Synovitis and tenosynovitis, unspecified: Secondary | ICD-10-CM | POA: Diagnosis present

## 2023-05-14 DIAGNOSIS — J45909 Unspecified asthma, uncomplicated: Secondary | ICD-10-CM | POA: Diagnosis present

## 2023-05-14 DIAGNOSIS — R011 Cardiac murmur, unspecified: Secondary | ICD-10-CM | POA: Diagnosis present

## 2023-05-14 DIAGNOSIS — I10 Essential (primary) hypertension: Secondary | ICD-10-CM | POA: Diagnosis present

## 2023-05-14 DIAGNOSIS — G4730 Sleep apnea, unspecified: Secondary | ICD-10-CM | POA: Diagnosis present

## 2023-05-14 DIAGNOSIS — M25511 Pain in right shoulder: Secondary | ICD-10-CM | POA: Diagnosis present

## 2023-05-14 DIAGNOSIS — Z9109 Other allergy status, other than to drugs and biological substances: Secondary | ICD-10-CM | POA: Diagnosis not present

## 2023-05-14 DIAGNOSIS — Z634 Disappearance and death of family member: Secondary | ICD-10-CM | POA: Diagnosis not present

## 2023-05-14 DIAGNOSIS — Z888 Allergy status to other drugs, medicaments and biological substances status: Secondary | ICD-10-CM | POA: Diagnosis not present

## 2023-05-14 DIAGNOSIS — Z7901 Long term (current) use of anticoagulants: Secondary | ICD-10-CM | POA: Diagnosis not present

## 2023-05-14 DIAGNOSIS — Z9884 Bariatric surgery status: Secondary | ICD-10-CM | POA: Diagnosis not present

## 2023-05-14 DIAGNOSIS — Z79899 Other long term (current) drug therapy: Secondary | ICD-10-CM | POA: Diagnosis not present

## 2023-05-14 DIAGNOSIS — I517 Cardiomegaly: Secondary | ICD-10-CM | POA: Diagnosis present

## 2023-05-14 DIAGNOSIS — Z8249 Family history of ischemic heart disease and other diseases of the circulatory system: Secondary | ICD-10-CM | POA: Diagnosis not present

## 2023-05-14 DIAGNOSIS — Z880 Allergy status to penicillin: Secondary | ICD-10-CM | POA: Diagnosis not present

## 2023-05-14 DIAGNOSIS — M19011 Primary osteoarthritis, right shoulder: Secondary | ICD-10-CM | POA: Diagnosis present

## 2023-05-14 DIAGNOSIS — Z96612 Presence of left artificial shoulder joint: Secondary | ICD-10-CM | POA: Diagnosis present

## 2023-05-14 DIAGNOSIS — N184 Chronic kidney disease, stage 4 (severe): Secondary | ICD-10-CM | POA: Diagnosis present

## 2023-05-14 DIAGNOSIS — Z833 Family history of diabetes mellitus: Secondary | ICD-10-CM | POA: Diagnosis not present

## 2023-05-14 LAB — GLUCOSE, CAPILLARY
Glucose-Capillary: 106 mg/dL — ABNORMAL HIGH (ref 70–99)
Glucose-Capillary: 157 mg/dL — ABNORMAL HIGH (ref 70–99)
Glucose-Capillary: 99 mg/dL (ref 70–99)

## 2023-05-14 MED ORDER — APIXABAN 2.5 MG PO TABS
2.5000 mg | ORAL_TABLET | Freq: Two times a day (BID) | ORAL | 0 refills | Status: DC
  Filled 2023-05-14: qty 60, 30d supply, fill #0

## 2023-05-14 MED ORDER — ACETAMINOPHEN 500 MG PO TABS
1000.0000 mg | ORAL_TABLET | Freq: Three times a day (TID) | ORAL | 0 refills | Status: DC
  Filled 2023-05-14: qty 84, 14d supply, fill #0

## 2023-05-14 MED ORDER — OXYCODONE HCL 5 MG PO TABS
ORAL_TABLET | ORAL | 0 refills | Status: DC
  Filled 2023-05-14: qty 30, 5d supply, fill #0

## 2023-05-14 MED ORDER — ONDANSETRON HCL 4 MG PO TABS
4.0000 mg | ORAL_TABLET | Freq: Three times a day (TID) | ORAL | 0 refills | Status: AC | PRN
  Filled 2023-05-14: qty 10, 4d supply, fill #0

## 2023-05-14 NOTE — Evaluation (Signed)
Physical Therapy Evaluation Patient Details Name: Colleen Gray MRN: [id number] DOB: 12-27-1950 Today's Date: 05/14/2023  History of Present Illness  Pt is a 72 yo female s/p right reverse lateralized total Shoulder Arthroplasty. She is s/p L RTSA 8 years ago. PHMx: enlarged heart, HTN, sleep apnea, OA, and anemia  Clinical Impression  Pt admitted with above diagnosis.  Pt currently with functional limitations due to the deficits listed below (see PT Problem List). Pt will benefit from acute skilled PT to increase their independence and safety with mobility to allow discharge.   The patient required extensive assistance to rise from recliner  and raised toilet, required close guarding for balance. Patient 's gait is very slow and guarded using quad cane. . Patient has bilateral knee DJD  with knee deformities and is in need for TKA per patient. Patient has 3 STE and is used to pulling up on Right rail, 1 step at a time, now unable to use th RUE. Will need to assess safety on steps, if not able, recommend nonemergency transport. Conti ne PT.           Recommendations for follow up therapy are one component of a multi-disciplinary discharge planning process, led by the attending physician.  Recommendations may be updated based on patient status, additional functional criteria and insurance authorization.  Follow Up Recommendations Can patient physically be transported by private vehicle: Yes     Assistance Recommended at Discharge Frequent or constant Supervision/Assistance  Patient can return home with the following  A little help with walking and/or transfers;A little help with bathing/dressing/bathroom;Assistance with cooking/housework;Assist for transportation;Help with stairs or ramp for entrance    Equipment Recommendations None recommended by PT  Recommendations for Other Services       Functional Status Assessment Patient has had a recent decline in their functional status  and demonstrates the ability to make significant improvements in function in a reasonable and predictable amount of time.     Precautions / Restrictions Precautions Precautions: Shoulder Type of Shoulder Precautions: Sling at all times except ADL/exercise Yes   Non weight bearing Yes   AROM elbow, wrist and hand to tolerance Yes   PROM of shoulder No   AROM of shoulder No   OT Consult Special Instructions Sling training in addition Shoulder Interventions: Shoulder sling/immobilizer Precaution Booklet Issued: Yes (comment) Precaution Comments: Per MD, ok to remove sling to help with balance Required Braces or Orthoses: Sling Restrictions Weight Bearing Restrictions: Yes RUE Weight Bearing: Non weight bearing      Mobility  Bed Mobility               General bed mobility comments: in recliner    Transfers Overall transfer level: Needs assistance Equipment used: Quad cane   Sit to Stand: Max assist           General transfer comment: multiple attempts from  lower recliner, had to pull up on bed rail next to recliner and therapist had to assist buttocks to power up. Vwry wide bas due to DJD in knees, esp left    Ambulation/Gait Ambulation/Gait assistance: Min assist Gait Distance (Feet): 20 Feet (x 2) Assistive device: Quad cane Gait Pattern/deviations: Step-to pattern, Wide base of support Gait velocity: decr Gait velocity interpretation: <1.8 ft/sec, indicate of risk for recurrent falls   General Gait Details: patient demonstrates guarded gait, noted knee deformities. Patient had to grab bed rail  for balance. very slow cadence.  Stairs  Wheelchair Mobility    Modified Rankin (Stroke Patients Only)       Balance Overall balance assessment: Needs assistance Sitting-balance support: No upper extremity supported, Feet supported Sitting balance-Leahy Scale: Fair     Standing balance support: Single extremity supported, During functional  activity Standing balance-Leahy Scale: Poor Standing balance comment: patient intermittently had to steady self when standing and manipulating clothes.                             Pertinent Vitals/Pain Pain Assessment Pain Assessment: 0-10 Pain Score: 8  Pain Location: right shoulder Pain Descriptors / Indicators: Aching, Discomfort Pain Intervention(s): Monitored during session, Premedicated before session    Home Living Family/patient expects to be discharged to:: Private residence Living Arrangements: Alone Available Help at Discharge: Family;Available PRN/intermittently Type of Home: House Home Access: Stairs to enter Entrance Stairs-Rails: Right (rail does not go  all the way) Entrance Stairs-Number of Steps: 3   Home Layout: One level Home Equipment: Tub bench;Cane - quad Additional Comments: lift chair, height of bed 21"    Prior Function Prior Level of Function : Independent/Modified Independent;Driving             Mobility Comments: uses a qud cne       Hand Dominance   Dominant Hand: Left    Extremity/Trunk Assessment   Upper Extremity Assessment Upper Extremity Assessment: Defer to OT evaluation RUE Deficits / Details: shoulder sx this admission    Lower Extremity Assessment Lower Extremity Assessment: RLE deficits/detail;LLE deficits/detail RLE Deficits / Details: noted  mild valgus LLE Deficits / Details: genu recurvatum and  valgus deformity    Cervical / Trunk Assessment Cervical / Trunk Assessment: Normal  Communication   Communication: No difficulties  Cognition Arousal/Alertness: Awake/alert Behavior During Therapy: WFL for tasks assessed/performed Overall Cognitive Status: Impaired/Different from baseline Area of Impairment: Memory                     Memory: Decreased recall of precautions, Decreased short-term memory         General Comments: frequent  reminder to not use the right arm        General  Comments      Exercises     Assessment/Plan    PT Assessment Patient needs continued PT services  PT Problem List Decreased mobility;Decreased range of motion;Decreased knowledge of precautions;Decreased activity tolerance;Decreased balance;Decreased knowledge of use of DME;Pain       PT Treatment Interventions DME instruction;Therapeutic activities;Gait training;Therapeutic exercise;Patient/family education;Stair training;Functional mobility training    PT Goals (Current goals can be found in the Care Plan section)  Acute Rehab PT Goals Patient Stated Goal: to  be able to take care of myself, PT Goal Formulation: With patient Time For Goal Achievement: 05/21/23 Potential to Achieve Goals: Good    Frequency Min 5X/week     Co-evaluation               AM-PAC PT "6 Clicks" Mobility  Outcome Measure Help needed turning from your back to your side while in a flat bed without using bedrails?: A Little Help needed moving from lying on your back to sitting on the side of a flat bed without using bedrails?: A Little Help needed moving to and from a bed to a chair (including a wheelchair)?: A Lot Help needed standing up from a chair using your arms (e.g., wheelchair or bedside chair)?: A Lot Help needed  to walk in hospital room?: A Lot Help needed climbing 3-5 steps with a railing? : Total 6 Click Score: 13    End of Session Equipment Utilized During Treatment: Gait belt Activity Tolerance: Patient tolerated treatment well Patient left: in chair (w/OT) Nurse Communication: Mobility status PT Visit Diagnosis: Unsteadiness on feet (R26.81);Difficulty in walking, not elsewhere classified (R26.2)    Time: 9147-8295 PT Time Calculation (min) (ACUTE ONLY): 40 min   Charges:   PT Evaluation $PT Eval Low Complexity: 1 Low PT Treatments $Gait Training: 8-22 mins $Self Care/Home Management: 8-22        Blanchard Kelch PT Acute Rehabilitation Services Office  [phone] Weekend [phone]   Rada Hay 05/14/2023, 2:58 PM

## 2023-05-14 NOTE — Plan of Care (Signed)
  Problem: Education: Goal: Knowledge of the prescribed therapeutic regimen will improve Outcome: Progressing   Problem: Pain Management: Goal: Pain level will decrease with appropriate interventions Outcome: Progressing   Problem: Education: Goal: Knowledge of General Education information will improve Description: Including pain rating scale, medication(s)/side effects and non-pharmacologic comfort measures Outcome: Progressing   

## 2023-05-14 NOTE — TOC CM/SW Note (Signed)
Transition of Care [facility]) Screening Note  Patient Details  Name: Colleen Gray Date of Birth: 02-11-1951  Transition of Care [facility] CM/SW Contact:    Ewing Schlein, LCSW Phone Number: 05/14/2023, 8:50 AM  Transition of Care Department [facility]) has reviewed patient and no TOC needs have been identified at this time. We will continue to monitor patient advancement through interdisciplinary progression rounds. If new patient transition needs arise, please place a TOC consult.

## 2023-05-14 NOTE — Progress Notes (Signed)
Occupational Therapy Treatment Patient Details Name: Colleen Gray MRN: [id number] DOB: May 07, 1951 Today's Date: 05/14/2023   History of present illness Pt is a 72 yo female s/p right reverse lateralized total Shoulder Arthroplasty. She is s/p L RTSA 8 years ago. PHMx: enlarged heart, HTN, sleep apnea, OA, and anemia   OT comments  Pt seen for a second time today to see if any progress could be made towards a safe and independent discharge home alone. She is still very limited in her self care capabilities due to decreased use of RUE, bad Bil knees, decreased balance and body habitus. Feel still that safest D/C would be for continued inpatient follow up therapy, <3 hours/day.We will continue to follow.    Recommendations for follow up therapy are one component of a multi-disciplinary discharge planning process, led by the attending physician.  Recommendations may be updated based on patient status, additional functional criteria and insurance authorization.    Assistance Recommended at Discharge Frequent or constant Supervision/Assistance  Patient can return home with the following  A little help with walking and/or transfers;A lot of help with bathing/dressing/bathroom;Assistance with cooking/housework;Help with stairs or ramp for entrance;Assist for transportation   Equipment Recommendations  BSC/3in1 (due to she cannot get up from her regular toilet now due to she cannot use her RUE to push up (due to shoulder sx this admission))       Precautions / Restrictions Precautions Precautions: Shoulder Type of Shoulder Precautions: Sling at all times except ADL/exercise Yes   Non weight bearing Yes   AROM elbow, wrist and hand to tolerance Yes   PROM of shoulder No   AROM of shoulder No   OT Consult Special Instructions Sling training in addition Shoulder Interventions: Shoulder sling/immobilizer Precaution Booklet Issued: Yes (comment) Precaution Comments: Per MD, ok to remove sling to  help with balance when up ambulating Required Braces or Orthoses: Sling Restrictions Weight Bearing Restrictions: Yes RUE Weight Bearing: Non weight bearing       Mobility Bed Mobility Overal bed mobility: Needs Assistance Bed Mobility: Sit to Supine       Sit to supine: Min guard   General bed mobility comments: VCs for sequencing    Transfers Overall transfer level: Needs assistance Equipment used: Quad cane Transfers: Sit to/from Stand Sit to Stand: Min assist, From elevated surface           General transfer comment: rocking momentum     Balance Overall balance assessment: Needs assistance Sitting-balance support: No upper extremity supported, Feet supported Sitting balance-Leahy Scale: Fair     Standing balance support: Single extremity supported, During functional activity Standing balance-Leahy Scale: Poor                             ADL either performed or assessed with clinical judgement   ADL Overall ADL's : Needs assistance/impaired                   Upper Body Dressing Details (indicate cue type and reason): S to doff sling, total A to donn sling       Toilet Transfer Details (indicate cue type and reason): simulated min A sit<>stand from built up recliner seat using LUE and rocking momentum to stand up, then stand step with QC from recliner >bed                Extremity/Trunk Assessment Upper Extremity Assessment Upper Extremity Assessment: Defer to OT  evaluation RUE Deficits / Details: shoulder sx this admission RUE Coordination: decreased gross motor      Vision Patient Visual Report: No change from baseline            Cognition Arousal/Alertness: Awake/alert Behavior During Therapy: WFL for tasks assessed/performed Overall Cognitive Status: Impaired/Different from baseline Area of Impairment: Memory                     Memory: Decreased recall of precautions, Decreased short-term memory          General Comments: frequent  reminder to not use the right arm                   Pertinent Vitals/ Pain       Pain Assessment Pain Assessment: 0-10 Pain Score: 8  Pain Location: right shoulder Pain Descriptors / Indicators: Aching, Discomfort Pain Intervention(s): Limited activity within patient's tolerance, Monitored during session, Repositioned, Ice applied  Home Living Family/patient expects to be discharged to:: Private residence Living Arrangements: Alone Available Help at Discharge: Family;Available PRN/intermittently Type of Home: House Home Access: Stairs to enter [employer] of Steps: 3 Entrance Stairs-Rails: Right (rail does not go  all the way) Home Layout: One level     Bathroom Shower/Tub: Chief Strategy Officer: Handicapped height     Home Equipment: Tub bench;Cane - quad   Additional Comments: lift chair, height of bed 21"          Frequency  Min 2X/week        Progress Toward Goals  OT Goals(current goals can now be found in the care plan section)  Progress towards OT goals: Not progressing toward goals - comment (due to limitations on RUE use, Bil bad knees, and body habitus)  Acute Rehab OT Goals Patient Stated Goal: to be able to take care of myself OT Goal Formulation: With patient Time For Goal Achievement: 05/28/23 Potential to Achieve Goals: Good  Plan Discharge plan remains appropriate       AM-PAC OT "6 Clicks" Daily Activity     Outcome Measure   Help from another person eating meals?: A Little Help from another person taking care of personal grooming?: A Lot Help from another person toileting, which includes using toliet, bedpan, or urinal?: A Lot Help from another person bathing (including washing, rinsing, drying)?: A Lot Help from another person to put on and taking off regular upper body clothing?: A Lot Help from another person to put on and taking off regular lower body clothing?: A Lot 6  Click Score: 13    End of Session Equipment Utilized During Treatment: Gait belt (sling put back on once in bed)  OT Visit Diagnosis: Unsteadiness on feet (R26.81);Other abnormalities of gait and mobility (R26.89);Muscle weakness (generalized) (M62.81);Pain Pain - Right/Left: Right Pain - part of body: Shoulder   Activity Tolerance Patient tolerated treatment well   Patient Left in bed;with call bell/phone within reach;with bed alarm set   Nurse Communication Mobility status (NT)        Time: 1610-9604 OT Time Calculation (min): 37 min  Charges: OT General Charges $OT Visit: 1 Visit OT Evaluation $OT Eval Moderate Complexity: 1 Mod OT Treatments $Self Care/Home Management : 23-37 mins $Therapeutic Exercise: 8-22 mins  Lindon Romp OT Acute Rehabilitation Services Office [phone]    Colleen Gray 05/14/2023, 5:15 PM

## 2023-05-14 NOTE — Progress Notes (Signed)
Patient's discharge medications delivered to the room from [facility]., placed in belongings bag.

## 2023-05-14 NOTE — Progress Notes (Signed)
   ORTHOPAEDIC PROGRESS NOTE  s/p Procedure(s): REVERSE SHOULDER ARTHROPLASTY on 05/14/23  SUBJECTIVE: Reports moderate pain about operative site. She feels better than she did yesterday evening.   No chest pain. No SOB. No nausea/vomiting. No other complaints.  OBJECTIVE: PE: General: sitting up in hospital bed, NAD Cardiac: regular rate Pulmonary: no increased work of breathing RUE: Dressing CDI and sling well fitting.. + Motor in  AIN, PIN, Ulnar distributions. Axillary nerve sensation preserved and symmetric.  Distal sensation grossly intact - she does report paresthesias in her right thumb. Well perfused digits.    Vitals:   05/14/23 0236 05/14/23 0630  BP: (!) 154/50 (!) 143/57  Pulse: (!) 56 73  Resp: 18 18  Temp: 98.2 F (36.8 C)   SpO2: 95% 95%   Stable post-op images.   ASSESSMENT: Colleen Gray is a 72 y.o. female doing well postoperatively.  PLAN: Weightbearing: NWB RUE Insicional and dressing care: Reinforce dressings as needed Orthopedic device(s):  Sling Showering: post-op day #2 VTE prophylaxis: Xarelto Pain control: PRN pain medication, minimize narcotics as able Follow - up plan: 2 weeks Dispo: Plan to discharge home today after OT. Patient would like to talk to someone about her advance directive. We have placed order for chaplain to talk to her about.  Contact information:  Weekdays 8-5 Dr. Ramond Marrow, Alfonse Alpers PA-C , After hours and holidays please check [email or web address] for group call information for [facility]   Alfonse Alpers, PA-C 05/14/2023

## 2023-05-14 NOTE — TOC Initial Note (Signed)
Transition of Care [facility]) - Initial/Assessment Note   Patient Details  Name: Colleen Gray MRN: [id number] Date of Birth: August 11, 1951  Transition of Care [facility]) CM/SW Contact:    Ewing Schlein, LCSW Phone Number: 05/14/2023, 3:33 PM  Clinical Narrative: Patient will now need HH (PT/OT/aide) as she is not inpatient or active with [facility], so she is not able to go to SNF. Patient requested an agency other than [facility] for [facility]. CSW made [facility] to [facility] with [facility], which was accepted. CSW explained patient does not meet criteria for a 3N1/BSC to be covered by insurance and patient reported she cannot private pay for one.  Expected Discharge Plan: Home w Home Health Services Barriers to Discharge: No Barriers Identified  Patient Goals and CMS Choice Patient states their goals for this hospitalization and ongoing recovery are:: Get home services CMS Medicare.gov Compare Post Acute Care list provided to:: Patient Choice offered to / list presented to : Patient  Expected Discharge Plan and Services In-house Referral: Clinical Social Work Post Acute Care Choice: Home Health Living arrangements for the past 2 months: Single Family Home Expected Discharge Date: 05/14/23               DME Arranged: N/A DME Agency: NA HH Arranged: PT, OT, Nurse's Aide HH Agency: [facility] ([facility]) Date HH Agency Contacted: 05/14/23 Time HH Agency Contacted: 1513 Representative spoke with at [facility] Agency: Morrie Sheldon  Prior Living Arrangements/Services Living arrangements for the past 2 months: Single Family Home Lives with:: Self Patient language and need for interpreter reviewed:: Yes Do you feel safe going back to the place where you live?: Yes      Need for Family Participation in Patient Care: No (Comment) Care giver support system in place?: No (comment) Criminal Activity/Legal Involvement Pertinent to Current Situation/Hospitalization: No - Comment as needed  Activities of Daily  Living Home Assistive Devices/Equipment: Other (Comment) (see prev charted) ADL Screening (condition at time of admission) Patient's cognitive ability adequate to safely complete daily activities?: Yes Is the patient deaf or have difficulty hearing?: No Does the patient have difficulty seeing, even when wearing glasses/contacts?: No Does the patient have difficulty concentrating, remembering, or making decisions?: No Patient able to express need for assistance with ADLs?: Yes Does the patient have difficulty dressing or bathing?: No Independently performs ADLs?: Yes (appropriate for developmental age) Does the patient have difficulty walking or climbing stairs?: Yes Weakness of Legs: Both Weakness of Arms/Hands: Both  Permission Sought/Granted Permission sought to share information with : Other (comment) Permission granted to share information with : Yes, Verbal Permission Granted Permission granted to share info w AGENCY: HH agencies  Emotional Assessment Appearance:: Appears stated age Attitude/Demeanor/Rapport: Crying Affect (typically observed): Anxious Orientation: : Oriented to Self, Oriented to Place, Oriented to  Time, Oriented to Situation Alcohol / Substance Use: Not Applicable Psych Involvement: No (comment)  Admission diagnosis:  Status post reverse total arthroplasty of right shoulder [Z96.611] Patient Active Problem List   Diagnosis Date Noted   Status post reverse total arthroplasty of right shoulder 05/13/2023   Pre-op evaluation 11/06/2022   ESRD (end stage renal disease) (HCC) 09/19/2022   ESRD (end stage renal disease) on dialysis (HCC) 07/09/2022   Polyneuropathy due to type 2 diabetes mellitus (HCC) 11/12/2021   CKD (chronic kidney disease) stage 5, GFR less than 15 ml/min (HCC) 04/23/2021   Abnormal weight gain 04/01/2021   Colon cancer screening 04/01/2021   Family history of malignant neoplasm of gastrointestinal  tract 04/01/2021   Iron deficiency anemia  04/01/2021   Stage 4 chronic kidney disease (HCC) 03/01/2020   Pain due to onychomycosis of toenails of both feet 07/05/2019   Diabetes mellitus without complication (HCC) 07/05/2019   Essential hypertension 06/29/2019   Carpal tunnel syndrome 02/06/2016   S/P shoulder replacement 11/22/2015   PCP:  Gwenyth Bender, MD Pharmacy:   [phone] [place], [place] - [address] AT COR[address] OF [facility] [address] [place] [address] Phone: [phone] Fax: [phone]  [facility] - [place], [place] - [address] [facility] AT Portal to Registered [address] [place] [address] Phone: [phone] Fax: [phone]  [facility] [address] [place] [address] Phone: [phone] Fax: [phone]  Social Determinants of Health (SDOH) Social History: SDOH Screenings   Housing: Patient Declined (05/13/2023)  Tobacco Use: Low Risk  (05/13/2023)   SDOH Interventions:    Readmission Risk Interventions     No data to display

## 2023-05-14 NOTE — Discharge Instructions (Addendum)
Ramond Marrow MD, MPH Colleen Alpers, PA-C [facility] [address] [phone] (tel)   [phone] (fax)   POST-OPERATIVE INSTRUCTIONS - TOTAL SHOULDER REPLACEMENT    WOUND CARE You may leave the operative dressing in place until your follow-up appointment. KEEP THE INCISIONS CLEAN AND DRY. There may be a small amount of fluid/bleeding leaking at the surgical site. This is normal after surgery.  If it fills with liquid or blood please call us immediately to change it for you. Use the provided ice machine or Ice packs as often as possible for the first 3-4 days, then as needed for pain relief.   Keep a layer of cloth or a shirt between your skin and the cooling unit to prevent frost bite as it can get very cold.  SHOWERING: - You may shower on Post-Op Day #2.  - The dressing is water resistant but do not scrub it as it may start to peel up.   - You may remove the sling for showering - Gently pat the area dry.  - Do not soak the shoulder in water.  - Do not go swimming in the pool or ocean until your incision has completely healed (about 4-6 weeks after surgery) - KEEP THE INCISIONS CLEAN AND DRY.  EXERCISES Wear the sling at all times  You may remove the sling for showering, but keep the arm across the chest or in a secondary sling.    Accidental/Purposeful External Rotation and shoulder flexion (reaching behind you) is to be avoided at all costs for the first month. It is ok to come out of your sling if your are sitting and have assistance for eating.   Do not lift anything heavier than 1 pound until we discuss it further in clinic.  It is normal for your fingers/hand to become more swollen after surgery and discolored from bruising.   This will resolve over the first few weeks usually after surgery. Please continue to ambulate and do not stay sitting or lying for too long.  Perform foot and wrist pumps to assist in circulation.  PHYSICAL  THERAPY - No physical therapy for shoulder for 4 weeks after surgery - okay for elbow, wrist, hand exercises  REGIONAL ANESTHESIA (NERVE BLOCKS) The anesthesia team may have performed a nerve block for you this is a great tool used to minimize pain.   The block may start wearing off overnight (between 8-24 hours postop) When the block wears off, your pain may go from nearly zero to the pain you would have had postop without the block. This is an abrupt transition but nothing dangerous is happening.   This can be a challenging period but utilize your as needed pain medications to try and manage this period. We suggest you use the pain medication the first night prior to going to bed, to ease this transition.  You may take an extra dose of narcotic when this happens if needed   POST-OP MEDICATIONS- Multimodal approach to pain control In general your pain will be controlled with a combination of substances.  Prescriptions unless otherwise discussed are electronically sent to your pharmacy.  This is a carefully made plan we use to minimize narcotic use.     Acetaminophen - Non-narcotic pain medicine taken on a scheduled basis  Oxycodone - This is a strong narcotic, to be used only on an "as needed" basis for SEVERE pain. Eliquis - This medicine is used to minimize the risk of blood clots  after surgery. Zofran -  take as needed for nausea   FOLLOW-UP If you develop a Fever (>101.5), Redness or Drainage from the surgical incision site, please call our office to arrange for an evaluation. Please call the office to schedule a follow-up appointment for a wound check, 7-10 days post-operatively.  IF YOU HAVE ANY QUESTIONS, PLEASE FEEL FREE TO CALL OUR OFFICE.  HELPFUL INFORMATION  Your arm will be in a sling following surgery. You will be in this sling for the next 4 weeks.   You may be more comfortable sleeping in a semi-seated position the first few nights following surgery.  Keep a pillow  propped under the elbow and forearm for comfort.  If you have a recliner type of chair it might be beneficial.  If not that is fine too, but it would be helpful to sleep propped up with pillows behind your operated shoulder as well under your elbow and forearm.  This will reduce pulling on the suture lines.  When dressing, put your operative arm in the sleeve first.  When getting undressed, take your operative arm out last.  Loose fitting, button-down shirts are recommended.  In most states it is against the law to drive while your arm is in a sling. And certainly against the law to drive while taking narcotics.  You may return to work/school in the next couple of days when you feel up to it. Desk work and typing in the sling is fine.  We suggest you use the pain medication the first night prior to going to bed, in order to ease any pain when the anesthesia wears off. You should avoid taking pain medications on an empty stomach as it will make you nauseous.  You should wean off your narcotic medicines as soon as you are able.     Most patients will be off narcotics before their first postop appointment.   Do not drink alcoholic beverages or take illicit drugs when taking pain medications.  Pain medication may make you constipated.  Below are a few solutions to try in this order: Decrease the amount of pain medication if you aren't having pain. Drink lots of decaffeinated fluids. Drink prune juice and/or each dried prunes  If the first 3 don't work start with additional solutions Take Colace - an over-the-counter stool softener Take Senokot - an over-the-counter laxative Take Miralax - a stronger over-the-counter laxative   Dental Antibiotics:  In most cases prophylactic antibiotics for Dental procdeures after total joint surgery are not necessary.  Exceptions are as follows:  1. History of prior total joint infection  2. Severely immunocompromised (Organ Transplant, cancer  chemotherapy, Rheumatoid biologic meds such as Humera)  3. Poorly controlled diabetes (A1C &gt; 8.0, blood glucose over 200)  If you have one of these conditions, contact your surgeon for an antibiotic prescription, prior to your dental procedure.   For more information including helpful videos and documents visit our website:   [email or web address]

## 2023-05-14 NOTE — Progress Notes (Signed)
Chaplain completed notarization and witnessing of Scientist, water quality, Healthcare POA. New AD document should be processed in the next 48 hours. Previous document was from 2016, updated document is 2024.   Chaplain facilitated process and provided support.     05/14/23 1500  Spiritual Encounters  Type of Visit Follow up  Reason for visit Advance directives

## 2023-05-14 NOTE — Progress Notes (Signed)
Chaplain engaged in an initial visit with Colleen Gray. Chaplain provided Scientist, water quality, Healthcare POA support and education. Colleen Gray desires to ensure that her AD reflects that she doesn't want life support such as being on a ventilator. She also wants to be an organ donor. Chaplain does see that she has an Scientist, water quality on file in the system and has given her healthcare agent the ability to make decisions for her outside of her living will. Chaplain is checking with supervisory team to see if the AD needs to be updated or if it suffices as it is now. She does not desire to change her healthcare agents listed.   Chaplain will follow-up.     05/14/23 1100  Spiritual Encounters  Type of Visit Initial  Care provided to: Patient  Reason for visit Advance directives  Spiritual Framework  Presenting Themes Goals in life/care;Values and beliefs;Significant life change  Interventions  Spiritual Care Interventions Made Decision-making support/facilitation;Established relationship of care and support;Reflective listening;Compassionate presence  Intervention Outcomes  Outcomes Awareness of support;Connection to values and goals of care

## 2023-05-14 NOTE — Evaluation (Signed)
Occupational Therapy Evaluation Patient Details Name: Colleen Gray MRN: [id number] DOB: 17-Feb-1951 Today's Date: 05/14/2023   History of Present Illness Pt is a 72 yo female s/p right reverse lateralized total Shoulder Arthroplasty. She is s/p L RTSA 8 years ago. PHMx: enlarged heart, HTN, sleep apnea, OA, and anemia   Clinical Impression   This 72 yo female admitted and underwent above presents to acute OT with PLOF of being able to do her own basic ADLs, IADLs, and driving pta. Currently due to restrictions from right shoulder surgery she cannot do her own basic ADLs (bathing, dressing, toileting) following the precautions of weight bearing and use of her RUE. She needed numerous cues to not use/move her RUE during a full ADL session (bathing, dressing, grooming, toileting). She will continue to benefit from acute OT with follow up from continued inpatient follow up therapy, <3 hours/day as best and safest option but due to how medicare and observation status works this is not an option for her so HHOT with as much assist as possible is next best option. We will continue to follow.       Recommendations for follow up therapy are one component of a multi-disciplinary discharge planning process, led by the attending physician.  Recommendations may be updated based on patient status, additional functional criteria and insurance authorization.   Assistance Recommended at Discharge Frequent or constant Supervision/Assistance  Patient can return home with the following A little help with walking and/or transfers;A lot of help with bathing/dressing/bathroom;Assistance with cooking/housework;Help with stairs or ramp for entrance;Assist for transportation    Functional Status Assessment  Patient has had a recent decline in their functional status and demonstrates the ability to make significant improvements in function in a reasonable and predictable amount of time.  Equipment Recommendations   BSC/3in1 (due to she cannot get up from her regular toilet now due to she cannot use her RUE to push up (due to shoulder sx this admission))       Precautions / Restrictions Precautions Precautions: Shoulder Type of Shoulder Precautions: Sling at all times except ADL/exercise Yes   Non weight bearing Yes   AROM elbow, wrist and hand to tolerance Yes   PROM of shoulder No   AROM of shoulder No   OT Consult Special Instructions Sling training in addition Shoulder Interventions: Shoulder sling/immobilizer Precaution Booklet Issued: Yes (comment) Required Braces or Orthoses: Sling Restrictions Weight Bearing Restrictions: Yes RUE Weight Bearing: Non weight bearing      Mobility Bed Mobility Overal bed mobility: Modified Independent             General bed mobility comments: increased time and VCs (legs to side first then up with trunk and use of LUE to come up to sit)    Transfers Overall transfer level: Needs assistance Equipment used: Quad cane Transfers: Sit to/from Stand Sit to Stand: [employer]  transfer comment: using only LUE from higher surfaces to get up; min A to get up from lower surfaces with only using LUE      Balance Overall balance assessment: Needs assistance Sitting-balance support: No upper extremity supported, Feet supported Sitting balance-Leahy Scale: Fair     Standing balance support: Single extremity supported Standing balance-Leahy Scale: Poor                             ADL either performed or assessed with  clinical judgement   ADL Overall ADL's : Needs assistance/impaired Eating/Feeding: Set up;Sitting   Grooming: Min guard;Standing   Upper Body Bathing: Minimal assistance;Sitting   Lower Body Bathing: Min guard;Sit to/from stand   Upper Body Dressing : Maximal assistance;Sitting   Lower Body Dressing: Maximal assistance;Sit to/from stand   Toilet Transfer: Min guard;Ambulation;BSC/3in1 Statistician  Details (indicate cue type and reason): over toilet, uses QC for ambulation Toileting- Clothing Manipulation and Hygiene: Moderate assistance Toileting - Clothing Manipulation Details (indicate cue type and reason): cannot get to back peri area to clean with LUE             Vision Patient Visual Report: No change from baseline              Pertinent Vitals/Pain Pain Assessment Pain Assessment: No/denies pain     Hand Dominance Left   Extremity/Trunk Assessment Upper Extremity Assessment Upper Extremity Assessment: RUE deficits/detail RUE Deficits / Details: shoulder sx this admission           Communication Communication Communication: No difficulties   Cognition Arousal/Alertness: Awake/alert Behavior During Therapy: WFL for tasks assessed/performed Overall Cognitive Status: Impaired/Different from baseline Area of Impairment: Memory                     Memory: Decreased recall of precautions, Decreased short-term memory                  Exercises Other Exercises Other Exercises: educated and had pt return demo RUE elbow, forearm, wrist, and hand exercised        Home Living Family/patient expects to be discharged to:: Private residence Living Arrangements: Alone Available Help at Discharge: Family;Available PRN/intermittently (evenings only at times) Type of Home: House Home Access: Stairs to enter [employer] of Steps: 3 Entrance Stairs-Rails: Right Home Layout: One level     Bathroom Shower/Tub: Chief Strategy Officer: Handicapped height     Home Equipment: Tub bench;Cane - quad          Prior Functioning/Environment Prior Level of Function : Independent/Modified Independent;Driving                        OT Problem List: Decreased strength;Decreased range of motion;Impaired balance (sitting and/or standing);Decreased knowledge of precautions;Impaired UE functional use;Obesity      OT  Treatment/Interventions: Self-care/ADL training;DME and/or AE instruction;Patient/family education;Balance training    OT Goals(Current goals can be found in the care plan section) Acute Rehab OT Goals Patient Stated Goal: to be able to take care of myself OT Goal Formulation: With patient Time For Goal Achievement: 05/28/23 Potential to Achieve Goals: Good  OT Frequency: Min 2X/week       AM-PAC OT "6 Clicks" Daily Activity     Outcome Measure Help from another person eating meals?: A Little Help from another person taking care of personal grooming?: A Lot Help from another person toileting, which includes using toliet, bedpan, or urinal?: A Lot Help from another person bathing (including washing, rinsing, drying)?: A Lot Help from another person to put on and taking off regular upper body clothing?: A Lot Help from another person to put on and taking off regular lower body clothing?: A Lot 6 Click Score: 4   End of Session Equipment Utilized During Treatment: Gait belt (quad cane, sling) Nurse Communication: Mobility status (NT)  Activity Tolerance: Patient tolerated treatment well Patient left: in chair;with call bell/phone within reach;with chair alarm set  OT Visit Diagnosis: Unsteadiness on feet (R26.81);Other abnormalities of gait and mobility (R26.89);Muscle weakness (generalized) (M62.81)                Time: 1610-9604 OT Time Calculation (min): 80 min Charges:  OT General Charges $OT Visit: 1 Visit OT Evaluation $OT Eval Moderate Complexity: 1 Mod OT Treatments $Self Care/Home Management : 38-52 mins $Therapeutic Exercise: 8-22 mins  Lindon Romp OT Acute Rehabilitation Services Office [phone]    Evette Georges 05/14/2023, 1:46 PM

## 2023-05-15 ENCOUNTER — Encounter (HOSPITAL_COMMUNITY): Payer: Self-pay | Admitting: Orthopaedic Surgery

## 2023-05-15 MED ORDER — HEPARIN SODIUM (PORCINE) 5000 UNIT/ML IJ SOLN
5000.0000 [IU] | Freq: Three times a day (TID) | INTRAMUSCULAR | Status: DC
  Administered 2023-05-15 – 2023-05-19 (×11): 5000 [IU] via SUBCUTANEOUS
  Filled 2023-05-15 (×11): qty 1

## 2023-05-15 MED ORDER — ENOXAPARIN SODIUM 30 MG/0.3ML IJ SOSY: 30.0000 mg | PREFILLED_SYRINGE | INTRAMUSCULAR | Status: DC

## 2023-05-15 NOTE — [place] FL2 (Signed)
[place] MEDICAID FL2 LEVEL OF CARE FORM     IDENTIFICATION  Gray Name: Colleen Gray Birthdate: Nov 08, 1951 Sex: female Admission Date (Current Location): 05/13/2023  [facility] and [place] Number:  Producer, television/film/video and Address:  [facility],  [address] [place], [place] [address]      Provider Number: [id number]  Attending Physician Name and Address:  Bjorn Pippin, MD  Relative Name and Phone Number:  aunt, Sherlynn Stalls [phone]    Current Level of Care: Hospital Recommended Level of Care: Skilled Nursing Facility Prior Approval Number:    Date Approved/Denied:   PASRR Number: [id number]  Discharge Plan: SNF    Current Diagnoses: Gray Active Problem List   Diagnosis Date Noted   S/p reverse total shoulder arthroplasty 05/14/2023   Status post reverse total arthroplasty of right shoulder 05/13/2023   Pre-op evaluation 11/06/2022   ESRD (end stage renal disease) (HCC) 09/19/2022   ESRD (end stage renal disease) on dialysis (HCC) 07/09/2022   Polyneuropathy due to type 2 diabetes mellitus (HCC) 11/12/2021   CKD (chronic kidney disease) stage 5, GFR less than 15 ml/min (HCC) 04/23/2021   Abnormal weight gain 04/01/2021   Colon cancer screening 04/01/2021   Family history of malignant neoplasm of gastrointestinal tract 04/01/2021   Iron deficiency anemia 04/01/2021   Stage 4 chronic kidney disease (HCC) 03/01/2020   Pain due to onychomycosis of toenails of both feet 07/05/2019   Diabetes mellitus without complication (HCC) 07/05/2019   Essential hypertension 06/29/2019   Carpal tunnel syndrome 02/06/2016   S/P shoulder replacement 11/22/2015    Orientation RESPIRATION BLADDER Height & Weight     Self, Time, Situation, Place  Normal Continent Weight: 234 lb 12.6 oz (106.5 kg) Height:  5\' 3"  (160 cm)  BEHAVIORAL SYMPTOMS/MOOD NEUROLOGICAL BOWEL NUTRITION STATUS      Continent Diet (regular)  AMBULATORY STATUS COMMUNICATION OF NEEDS Skin    Limited Assist Verbally Other (Comment) (surgical incision only)                       Personal Care Assistance Level of Assistance  Bathing, Dressing Bathing Assistance: Limited assistance   Dressing Assistance: Limited assistance     Functional Limitations Info  Sight, Speech, Hearing Sight Info: Adequate Hearing Info: Adequate Speech Info: Adequate    SPECIAL CARE FACTORS FREQUENCY  PT (By licensed PT), OT (By licensed OT)     PT Frequency: 5x/wk OT Frequency: 5x/wk            Contractures Contractures Info: Not present    Additional Factors Info  Code Status, Allergies Code Status Info: Full Allergies Info: Nifedipine, Other, Penicillins           Current Medications (05/15/2023):  This is the current hospital active medication list Current Facility-Administered Medications  Medication Dose Route Frequency Provider Last Rate Last Admin   amLODipine (NORVASC) tablet 10 mg  10 mg Oral Daily Vernetta Honey, PA-C   10 mg at 05/15/23 8469   bisacodyl (DULCOLAX) suppository 10 mg  10 mg Rectal Daily PRN Vernetta Honey, PA-C       calcitRIOL (ROCALTROL) capsule 0.5 mcg  0.5 mcg Oral Daily Vernetta Honey, PA-C   0.5 mcg at 05/15/23 0933   diphenhydrAMINE (BENADRYL) 12.5 MG/5ML elixir 12.5-25 mg  12.5-25 mg Oral Q4H PRN McBane, Jerald Kief, PA-C       docusate sodium (COLACE) capsule 100 mg  100 mg Oral BID Alfonse Alpers  N, PA-C   100 mg at 05/14/23 2132   ferrous sulfate tablet 325 mg  325 mg Oral Q breakfast Vernetta Honey, PA-C   325 mg at 05/15/23 0750   furosemide (LASIX) tablet 40 mg  40 mg Oral Daily Vernetta Honey, PA-C   40 mg at 05/15/23 6948   HYDROmorphone (DILAUDID) injection 0.5 mg  0.5 mg Intravenous Q4H PRN McBane, Jerald Kief, PA-C       labetalol (NORMODYNE) tablet 200 mg  200 mg Oral BID Vernetta Honey, PA-C   200 mg at 05/15/23 5462   menthol-cetylpyridinium (CEPACOL) lozenge 3 mg  1 lozenge Oral PRN McBane, Jerald Kief,  PA-C       Or   phenol (CHLORASEPTIC) mouth spray 1 spray  1 spray Mouth/Throat PRN McBane, Jerald Kief, PA-C       methocarbamol (ROBAXIN) tablet 500 mg  500 mg Oral Q6H PRN Vernetta Honey, PA-C   500 mg at 05/15/23 0932   Or   methocarbamol (ROBAXIN) 500 mg in dextrose 5 % 50 mL IVPB  500 mg Intravenous Q6H PRN McBane, Jerald Kief, PA-C       nitroGLYCERIN (NITROSTAT) SL tablet 0.4 mg  0.4 mg Sublingual Q5 min PRN McBane, Caroline N, PA-C       ondansetron (ZOFRAN) tablet 4 mg  4 mg Oral Q6H PRN McBane, Jerald Kief, PA-C       Or   ondansetron (ZOFRAN) injection 4 mg  4 mg Intravenous Q6H PRN McBane, Jerald Kief, PA-C       oxyCODONE (Oxy IR/ROXICODONE) immediate release tablet 10 mg  10 mg Oral Q4H PRN Vernetta Honey, PA-C   10 mg at 05/14/23 2058   oxyCODONE (Oxy IR/ROXICODONE) immediate release tablet 5 mg  5 mg Oral Q4H PRN Vernetta Honey, PA-C   5 mg at 05/15/23 0932   polyethylene glycol (MIRALAX / GLYCOLAX) packet 17 g  17 g Oral Daily PRN Vernetta Honey, PA-C       sevelamer carbonate (RENVELA) tablet 1,600 mg  1,600 mg Oral TID WC McBane, Caroline N, PA-C   1,600 mg at 05/15/23 0750   sodium bicarbonate tablet 650 mg  650 mg Oral TID Vernetta Honey, PA-C   650 mg at 05/15/23 1035     Discharge Medications: Please see discharge summary for a list of discharge medications.  Relevant Imaging Results:  Relevant Lab Results:   Additional Information SS# [id number]  Amada Jupiter, LCSW

## 2023-05-15 NOTE — TOC Progression Note (Signed)
Transition of Care [facility]) - Progression Note    Patient Details  Name: Melene Schrauben MRN: [id number] Date of Birth: 1951/02/09  Transition of [facility] CM/SW Contact  Amada Jupiter, LCSW Phone Number: 05/15/2023, 11:36 AM  Clinical Narrative:     Met with pt today to review dc recommendations per therapy for SNF rehab.  Pt is in full agreement with this plan. CSW has begun SNF bed search.  Expected Discharge Plan: Home w Home Health Services Barriers to Discharge: No Barriers Identified  Expected Discharge Plan and Services In-house Referral: Clinical Social Work   Post Acute Care Choice: Home Health Living arrangements for the past 2 months: Single Family Home Expected Discharge Date: 05/14/23               DME Arranged: N/A DME Agency: NA       HH Arranged: PT, OT, Nurse's Aide HH Agency: [facility] Date HH Agency Contacted: 05/14/23 Time HH Agency Contacted: 1513 Representative spoke with at [facility] Agency: Morrie Sheldon   Social Determinants of Health (SDOH) Interventions SDOH Screenings   Food Insecurity: No Food Insecurity (05/15/2023)  Housing: Patient Declined (05/15/2023)  Transportation Needs: No Transportation Needs (05/15/2023)  Utilities: Not At Risk (05/15/2023)  Tobacco Use: Low Risk  (05/13/2023)    Readmission Risk Interventions     No data to display

## 2023-05-15 NOTE — Progress Notes (Signed)
Physical Therapy Treatment Patient Details Name: Colleen Gray MRN: [id number] DOB: [birth year] Today's Date: 05/15/2023   History of Present Illness Pt is a 72 yo female s/p right reverse lateralized total Shoulder Arthroplasty. She is s/p L RTSA 8 years ago. PHMx: enlarged heart, HTN, sleep apnea, OA, and anemia    PT Comments     Pt admitted with above diagnosis.  Pt currently with functional limitations due to the deficits listed below (see PT Problem List). Pt seated in recliner when PT returned for PM session. Aunt present and indicated pt had recently finished with OT session. Pt reported 6/10 R UE pain at rest and increased to 7/10 with mobility tasks and nurse aware. Pt agreeable to therapy session. Pt required mod A for STS from recliner with cues for scooting to edge of chair, pt required cues for use of cane- personal with rubber quad tip for stability vs reaching for objects and cues t/o intervention to maintain R UE restrictions. Pt required min guard for gait tasks with cane, lateral sway noted with B knee dysfunction and pt reporting needing B TKA gait tolerance 30 feet in hallway. Pt reported need to void bladder, pt required cues for safety with transfer tasks and demonstrated absent eccentric control to elevated commode seat. Pt required min A for STS from elevated commode seat with cues for safety and L UE for push to stand. Pt required min guard for gait from bathroom to bed. Mod A for sit to supine. Pt positioned with pillows to support R UE, all needs in place and family present. PT continues to recommend SNF for short term rehab at this time. Pt will require increased S and A at time of d/c. Pt will benefit from acute skilled PT to increase their independence and safety with mobility to allow discharge.     Recommendations for follow up therapy are one component of a multi-disciplinary discharge planning process, led by the attending physician.  Recommendations may be updated  based on patient status, additional functional criteria and insurance authorization.  Follow Up Recommendations  Can patient physically be transported by private vehicle: No    Assistance Recommended at Discharge Frequent or constant Supervision/Assistance  Patient can return home with the following A little help with walking and/or transfers;A little help with bathing/dressing/bathroom;Assistance with cooking/housework;Assist for transportation;Help with stairs or ramp for entrance   Equipment Recommendations  None recommended by PT    Recommendations for Other Services       Precautions / Restrictions Precautions Precautions: Shoulder Type of Shoulder Precautions: Sling at all times except ADL/exercise Yes   Non weight bearing Yes   AROM elbow, wrist and hand to tolerance Yes   PROM of shoulder No   AROM of shoulder No   OT Consult Special Instructions Sling training in addition Shoulder Interventions: Shoulder sling/immobilizer Precaution Booklet Issued: Yes (comment) Precaution Comments: Per MD, ok to remove sling to help with balance when up ambulating Required Braces or Orthoses: Sling Restrictions Weight Bearing Restrictions: Yes RUE Weight Bearing: Non weight bearing Other Position/Activity Restrictions: PILLOW BEHIND RT UPPER ARM WHENEVER IN BED OR CHAIR     Mobility  Bed Mobility Overal bed mobility: Needs Assistance Bed Mobility: Sit to Supine       Sit to supine: Mod assist   General bed mobility comments: pt seated in recliner when PT arrived, mod A for LEs for sit to supine and max A for scooting to HOB, total A for positioning R UE in  bed    Transfers Overall transfer level: Needs assistance Equipment used: Quad cane (personal cane with rubber quad tip vs prongs) Transfers: Sit to/from Stand Sit to Stand: Mod assist (recliner with cues for push to stand with use of L UE, min A from elevated commode seat)           General transfer comment: rocking  momentum, B knee deficits impacing LE placement for power up    Ambulation/Gait Ambulation/Gait assistance: Min guard Gait Distance (Feet): 30 Feet Assistive device: Quad cane Gait Pattern/deviations: Step-to pattern, Wide base of support Gait velocity: decr     General Gait Details: patient demonstrates guarded gait, noted knee deformities. lateral sway   Stairs Stairs: Yes Stairs assistance: Mod assist Stair Management: One rail Right Number of Stairs: 2 General stair comments: L UE support on R handrail with pt ascending with LLE and step to pattern with noted difficulty with placement of LLE on step with circumduction, constant cues for maintaining R UE NWB in sling. pt required increased time for motor processing and planning, pt descended steps with R LE and reported she normally uses her LLE   Wheelchair Mobility    Modified Rankin (Stroke Patients Only)       Balance Overall balance assessment: Needs assistance Sitting-balance support: No upper extremity supported, Feet supported Sitting balance-Leahy Scale: Fair     Standing balance support: Single extremity supported, During functional activity Standing balance-Leahy Scale: Poor Standing balance comment: patient intermittently had to steady self when standing and manipulating clothes.                            Cognition Arousal/Alertness: Awake/alert Behavior During Therapy: WFL for tasks assessed/performed Overall Cognitive Status: Impaired/Different from baseline Area of Impairment: Memory                     Memory: Decreased recall of precautions, Decreased short-term memory         General Comments: Required review of HEP and all precautions.        Exercises      General Comments General comments (skin integrity, edema, etc.): pts Aunt present during PT intervention      Pertinent Vitals/Pain Pain Assessment Pain Assessment: 0-10 Pain Score: 7  Pain Location: right  shoulder. 5/10 at rest. Noted no pillow behind RT upper arm. Pt with facial grimace with placement of pillow and adjustment of sling for proper fit. Pain Descriptors / Indicators: Sore, Guarding, Grimacing Pain Intervention(s): Limited activity within patient's tolerance, Monitored during session, Premedicated before session, Ice applied (icman machine to R shoulder, pt reported pain 6/10 at rest and increasd to 7/10 wtih mobility)    Home Living                          Prior Function            PT Goals (current goals can now be found in the care plan section) Acute Rehab PT Goals Patient Stated Goal: to  be able to take care of myself, PT Goal Formulation: With patient Time For Goal Achievement: 05/21/23 Potential to Achieve Goals: Good Progress towards PT goals: Progressing toward goals    Frequency    Min 5X/week      PT Plan      Co-evaluation              AM-PAC PT "6 Clicks"  Mobility   Outcome Measure  Help needed turning from your back to your side while in a flat bed without using bedrails?: A Little Help needed moving from lying on your back to sitting on the side of a flat bed without using bedrails?: A Little Help needed moving to and from a bed to a chair (including a wheelchair)?: A Lot Help needed standing up from a chair using your arms (e.g., wheelchair or bedside chair)?: A Lot Help needed to walk in hospital room?: A Little Help needed climbing 3-5 steps with a railing? : A Lot 6 Click Score: 15    End of Session Equipment Utilized During Treatment: Gait belt Activity Tolerance: Patient tolerated treatment well Patient left: with call bell/phone within reach;with bed alarm set;with family/visitor present Nurse Communication: Mobility status PT Visit Diagnosis: Unsteadiness on feet (R26.81);Difficulty in walking, not elsewhere classified (R26.2)     Time: 8119-1478 PT Time Calculation (min) (ACUTE ONLY): 26 min  Charges:  $Gait  Training: 8-22 mins $Therapeutic Activity: 8-22 mins                     Johnny Bridge, PT Acute Rehab    Jacqualyn Posey 05/15/2023, 3:10 PM

## 2023-05-15 NOTE — Progress Notes (Signed)
Occupational Therapy Treatment Patient Details Name: Colleen Gray MRN: [id number] DOB: 13-Oct-1951 Today's Date: 05/15/2023   History of present illness Pt is a 72 yo female s/p right reverse lateralized total Shoulder Arthroplasty. She is s/p L RTSA 8 years ago. PHMx: enlarged heart, HTN, sleep apnea, OA, and anemia   OT comments  Patient progress is slow due to pt difficulty recalling precautions and requiring reinforcement of all education in relation to RUE precautions, sling positioning and need of pillow behind RT upper arm. Pt received in chair with RT hand dropped at wrist due to no wrist support and no pillow support to RUE. Pt educated on all corrections and reason for use of pillows behind upper arm and under RT forearm for support and give her neck rests.  Pt was shown all exercises again and added LT UE theraband exercises to help with strengthening as pt will need her LUE to push up from toilet and to use her cane.  Patient remains limited by memory impairments, post-op pain and restrictions, generalized weakness and decreased activity tolerance along with deficits noted below. Pt continues to demonstrate good rehab potential and would benefit from continued skilled OT to increase safety and independence with ADLs and functional transfers to allow pt to return home safely and reduce caregiver burden and fall risk.    Recommendations for follow up therapy are one component of a multi-disciplinary discharge planning process, led by the attending physician.  Recommendations may be updated based on patient status, additional functional criteria and insurance authorization.    Assistance Recommended at Discharge Frequent or constant Supervision/Assistance  Patient can return home with the following  A little help with walking and/or transfers;A lot of help with bathing/dressing/bathroom;Assistance with cooking/housework;Help with stairs or ramp for entrance;Assist for transportation    Equipment Recommendations   (Bidet may not work with [facility]. May need grab bars around toilet without riser.)    Recommendations for Other Services      Precautions / Restrictions Precautions Precautions: Shoulder Type of Shoulder Precautions: Sling at all times except ADL/exercise Yes   Non weight bearing Yes   AROM elbow, wrist and hand to tolerance Yes   PROM of shoulder No   AROM of shoulder No   OT Consult Special Instructions Sling training in addition Shoulder Interventions: Shoulder sling/immobilizer Precaution Comments: Per MD, ok to remove sling to help with balance when up ambulating Required Braces or Orthoses: Sling Restrictions Weight Bearing Restrictions: Yes RUE Weight Bearing: Non weight bearing Other Position/Activity Restrictions: PILLOW BEHIND RT UPPER ARM WHENEVER IN BED OR CHAIR       Mobility Bed Mobility                    Transfers                         Balance                                           ADL either performed or assessed with clinical judgement   ADL                               Toileting- Clothing Manipulation and Hygiene:  (Education) Toileting - Clothing Manipulation Details (indicate cue type and reason): Pt was educated on  adaptive options for hygiene stating that she is unable to perform adequate peri care with LUE. Discussed toileting wand but pt more interested in bidet. Pt shown short video of bidet attachment to her comfort heigh toilet at home and OT found bidet with LT controls for ease. Pt shown features for anterior and posterior peri hygiene with discussion of importance of preventing any kind of infection from UTI while healing. Pt with sevearl questions re: use and all questions answered.            Extremity/Trunk Assessment              Vision Baseline Vision/History: 1 Wears glasses Patient Visual Report: No change from baseline Vision Assessment?: No  apparent visual deficits   Perception     Praxis      Cognition Arousal/Alertness: Awake/alert Behavior During Therapy: WFL for tasks assessed/performed Overall Cognitive Status: Impaired/Different from baseline Area of Impairment: Memory                     Memory: Decreased recall of precautions, Decreased short-term memory         General Comments: Required review of HEP and all precautions.        Exercises General Exercises - Upper Extremity Shoulder Extension: Left, Seated, AROM, Strengthening, Theraband Theraband Level (Shoulder Extension): Level 3 (Green) Elbow Extension: AROM, Seated, Strengthening, Left, Theraband Theraband Level (Elbow Extension): Level 3 (Green) Hand Exercises Forearm Supination: AROM, Right, Seated, 10 reps Forearm Pronation: 10 reps, Right, AROM, Seated Wrist Flexion: AROM, 10 reps, Right Wrist Extension: AROM, 10 reps, Seated Digit Composite Flexion: AROM, Right, 10 reps, Seated Composite Extension: AROM, Seated, 10 reps, Right    Shoulder Instructions       General Comments      Pertinent Vitals/ Pain       Pain Assessment Pain Assessment: Faces Faces Pain Scale: Hurts whole lot Pain Location: right shoulder. 5/10 at rest. Noted no pillow behind RT upper arm. Pt with facial grimace with placement of pillow and adjustment of sling for proper fit. Pain Descriptors / Indicators: Sore, Guarding, Grimacing Pain Intervention(s): Limited activity within patient's tolerance, Monitored during session, Repositioned, Ice applied, Relaxation  Home Living                                          Prior Functioning/Environment              Frequency  Min 2X/week        Progress Toward Goals  OT Goals(current goals can now be found in the care plan section)  Progress towards OT goals: Not progressing toward goals - comment (Continues to need cues and assistance for precautions, positioning of sling and  exercises. Repition will be beneficial for pt.)  Acute Rehab OT Goals OT Goal Formulation: With patient Time For Goal Achievement: 05/28/23 Potential to Achieve Goals: Good ADL Goals Pt Will Perform Grooming: with modified independence;standing (at sink) Pt Will Perform Upper Body Bathing: with min assist;standing (at sink) Pt Will Perform Lower Body Bathing: with modified independence;sit to/from stand;with adaptive equipment Pt Will Perform Upper Body Dressing: with min assist;sitting Pt Will Perform Lower Body Dressing: with min assist;with adaptive equipment;sit to/from stand Pt Will Transfer to Toilet: with modified independence;ambulating;bedside commode (over toilet) Pt Will Perform Toileting - Clothing Manipulation and hygiene: with mod assist (Mod I sit<>stand) Pt/caregiver will Perform  Home Exercise Program: Increased ROM;Increased strength;Right Upper extremity;With written HEP provided;Independently (elbow, forearm, wrist, hand) Additional ADL Goal #1: Pt will be Mod I in and OOB for ADLs  Plan Discharge plan remains appropriate    Co-evaluation                 AM-PAC OT "6 Clicks" Daily Activity     Outcome Measure   Help from another person eating meals?: A Little Help from another person taking care of personal grooming?: A Lot Help from another person toileting, which includes using toliet, bedpan, or urinal?: A Lot Help from another person bathing (including washing, rinsing, drying)?: A Lot Help from another person to put on and taking off regular upper body clothing?: A Lot Help from another person to put on and taking off regular lower body clothing?: A Lot 6 Click Score: 13    End of Session Equipment Utilized During Treatment: Other (comment);Oxygen (Sling adjusted with thumb loop in place and elbow in corner ppocket.)  OT Visit Diagnosis: Unsteadiness on feet (R26.81);Other abnormalities of gait and mobility (R26.89);Muscle weakness (generalized)  (M62.81);Pain Pain - Right/Left: Right Pain - part of body: Shoulder   Activity Tolerance Patient limited by pain   Patient Left in chair;with call bell/phone within reach;with chair alarm set;with nursing/sitter in room;with family/visitor present   Nurse Communication          Time: 1610-9604 OT Time Calculation (min): 33 min  Charges: OT General Charges $OT Visit: 1 Visit OT Treatments $Self Care/Home Management : 8-22 mins $Therapeutic Exercise: 8-22 mins  Victorino Dike, OT Acute Rehab Services Office: [phone] 05/15/2023  Theodoro Clock 05/15/2023, 2:11 PM

## 2023-05-15 NOTE — Progress Notes (Signed)
Physical Therapy Treatment Patient Details Name: Colleen Gray MRN: [id number] DOB: 1951-07-03 Today's Date: 05/15/2023   History of Present Illness Pt is a 72 yo female s/p right reverse lateralized total Shoulder Arthroplasty. She is s/p L RTSA 8 years ago. PHMx: enlarged heart, HTN, sleep apnea, OA, and anemia    PT Comments     Pt admitted with above diagnosis.  Pt currently with functional limitations due to the deficits listed below (see PT Problem List). Pt seated in recliner when PT arrived. Pt agreeable to therapy intervention. Pt reported her pain was a little better today. Pt assisted pt to reposition R UE in sling. Pt required cues, increased time and max A for STS from recliner with pt using rocking/momentum, once standing pt required min guard for balance and for gait 15 feet to steps. Pt required increased time for motor processing and planning due to use of R UE on handrail PLOF, pt has R handrail in home setting. PT guided pt through use of L UE on R handrail and ascending with L LE with circumduction due to apparent ROM deficits, pt required mod A for navigating steps, step to pattern and descending with R LE, cues for safety, technique t/o pt is fearful of falling. Pt is not currently at baseline, or safe to transition home alone at this time. PT recommends continued therapy services in skilled rehab setting and pt will require nonemergency transport. Pt will benefit from acute skilled PT to increase their independence and safety with mobility to allow discharge.     Recommendations for follow up therapy are one component of a multi-disciplinary discharge planning process, led by the attending physician.  Recommendations may be updated based on patient status, additional functional criteria and insurance authorization.  Follow Up Recommendations  Can patient physically be transported by private vehicle: No    Assistance Recommended at Discharge Frequent or constant  Supervision/Assistance  Patient can return home with the following A little help with walking and/or transfers;A little help with bathing/dressing/bathroom;Assistance with cooking/housework;Assist for transportation;Help with stairs or ramp for entrance   Equipment Recommendations  None recommended by PT    Recommendations for Other Services       Precautions / Restrictions Precautions Precautions: Shoulder Type of Shoulder Precautions: Sling at all times except ADL/exercise Yes   Non weight bearing Yes   AROM elbow, wrist and hand to tolerance Yes   PROM of shoulder No   AROM of shoulder No   OT Consult Special Instructions Sling training in addition Shoulder Interventions: Shoulder sling/immobilizer Precaution Booklet Issued: Yes (comment) Precaution Comments: Per MD, ok to remove sling to help with balance when up ambulating Required Braces or Orthoses: Sling Restrictions Weight Bearing Restrictions: Yes RUE Weight Bearing: Non weight bearing     Mobility  Bed Mobility               General bed mobility comments: pt seated in recliner when PT arrived    Transfers Overall transfer level: Needs assistance Equipment used: Quad cane Transfers: Sit to/from Stand Sit to Stand: Max assist (recliner with cues for push to stand with use of L UE)           General transfer comment: rocking momentum, B knee deficits impacing LE placement for power up    Ambulation/Gait Ambulation/Gait assistance: Min guard Gait Distance (Feet): 15 Feet Assistive device: Quad cane Gait Pattern/deviations: Step-to pattern, Wide base of support Gait velocity: decr     General Gait Details:  patient demonstrates guarded gait, noted knee deformities. lateral sway   Stairs Stairs: Yes Stairs assistance: Mod assist Stair Management: One rail Right Number of Stairs: 2 General stair comments: L UE support on R handrail with pt ascending with LLE and step to pattern with noted difficulty  with placement of LLE on step with circumduction, constant cues for maintaining R UE NWB in sling. pt required increased time for motor processing and planning, pt descended steps with R LE and reported she normally uses her LLE   Wheelchair Mobility    Modified Rankin (Stroke Patients Only)       Balance Overall balance assessment: Needs assistance Sitting-balance support: No upper extremity supported, Feet supported Sitting balance-Leahy Scale: Fair     Standing balance support: Single extremity supported, During functional activity Standing balance-Leahy Scale: Poor                              Cognition Arousal/Alertness: Awake/alert Behavior During Therapy: WFL for tasks assessed/performed Overall Cognitive Status: Impaired/Different from baseline Area of Impairment: Memory                     Memory: Decreased recall of precautions, Decreased short-term memory         General Comments: frequent  reminder to not use the right arm        Exercises      General Comments        Pertinent Vitals/Pain Pain Assessment Pain Assessment: 0-10 Pain Score: 6  Pain Location: right shoulder Pain Descriptors / Indicators: Aching, Discomfort Pain Intervention(s): Monitored during session, Limited activity within patient's tolerance, Repositioned, Ice applied    Home Living                          Prior Function            PT Goals (current goals can now be found in the care plan section) Acute Rehab PT Goals Patient Stated Goal: to  be able to take care of myself, PT Goal Formulation: With patient Time For Goal Achievement: 05/21/23 Potential to Achieve Goals: Good Progress towards PT goals:  (pt is slow to progress toward goals)    Frequency    Min 5X/week      PT Plan      Co-evaluation              AM-PAC PT "6 Clicks" Mobility   Outcome Measure  Help needed turning from your back to your side while in a  flat bed without using bedrails?: A Little Help needed moving from lying on your back to sitting on the side of a flat bed without using bedrails?: A Little Help needed moving to and from a bed to a chair (including a wheelchair)?: A Lot Help needed standing up from a chair using your arms (e.g., wheelchair or bedside chair)?: A Lot Help needed to walk in hospital room?: A Little Help needed climbing 3-5 steps with a railing? : A Lot 6 Click Score: 15    End of Session Equipment Utilized During Treatment: Gait belt Activity Tolerance: Patient tolerated treatment well Patient left: in chair;with call bell/phone within reach;with chair alarm set Nurse Communication: Mobility status PT Visit Diagnosis: Unsteadiness on feet (R26.81);Difficulty in walking, not elsewhere classified (R26.2)     Time: 4098-1191 PT Time Calculation (min) (ACUTE ONLY): 21 min  Charges:  $Gait  Training: 8-22 mins                     Johnny Bridge, PT Acute Rehab    Jacqualyn Posey 05/15/2023, 10:42 AM

## 2023-05-15 NOTE — Progress Notes (Signed)
   ORTHOPAEDIC PROGRESS NOTE  s/p Procedure(s): REVERSE SHOULDER ARTHROPLASTY on 05/14/23  SUBJECTIVE: Reports moderate pain about operative site. She feels better than she did yesterday. She is still struggling with ambulation. She is nervous about going home. She has stairs to get into the house with limited railing. Plan is to work with therapy again today.   No chest pain. No SOB. No nausea/vomiting. No other complaints.  OBJECTIVE: PE: General: sitting up in hospital bed, NAD Cardiac: regular rate Pulmonary: no increased work of breathing RUE: Dressing CDI and sling well fitting.. + Motor in  AIN, PIN, Ulnar distributions. Axillary nerve sensation preserved and symmetric.  Distal sensation grossly intact.  Well perfused digits.    Vitals:   05/14/23 2131 05/15/23 0605  BP: 129/60 (!) 151/62  Pulse: 83 83  Resp: 18 15  Temp: 98.8 F (37.1 C) 98.1 F (36.7 C)  SpO2: 94% 94%   Stable post-op images.   ASSESSMENT: Colleen Gray is a 72 y.o. female doing well postoperatively. POD#2  PLAN: Weightbearing: NWB RUE Insicional and dressing care: Reinforce dressings as needed Orthopedic device(s):  Sling Showering: post-op day #2 VTE prophylaxis: Xarelto Pain control: PRN pain medication, minimize narcotics as able Follow - up plan: 2 weeks Dispo: TBD. She has had a lot of difficulty with ambulation. She lives at home alone with limited care. We will see how she does with therapy today. Will plan for either discharge home with home health versus SNF (but we did discuss she may not get approved for SNF).  Contact information:  Weekdays 8-5 Dr. Ramond Marrow, Alfonse Alpers PA-C , After hours and holidays please check [email or web address] for group call information for [facility]   Alfonse Alpers, PA-C 05/15/2023

## 2023-05-16 MED ORDER — ACETAMINOPHEN 500 MG PO TABS: 1000.0000 mg | ORAL_TABLET | Freq: Three times a day (TID) | ORAL | 0 refills | Status: AC

## 2023-05-16 MED ORDER — GABAPENTIN 100 MG PO CAPS
100.0000 mg | ORAL_CAPSULE | Freq: Two times a day (BID) | ORAL | Status: DC
  Administered 2023-05-16 – 2023-05-19 (×7): 100 mg via ORAL
  Filled 2023-05-16 (×7): qty 1

## 2023-05-16 MED ORDER — OXYCODONE HCL 5 MG PO TABS: ORAL_TABLET | ORAL | 0 refills | Status: AC

## 2023-05-16 MED ORDER — MENTHOL 3 MG MT LOZG: 1.0000 | LOZENGE | OROMUCOSAL | Status: DC | PRN

## 2023-05-16 MED ORDER — OXYCODONE HCL 5 MG PO TABS: 5.0000 mg | ORAL_TABLET | Freq: Four times a day (QID) | ORAL | Status: DC | PRN

## 2023-05-16 MED ORDER — APIXABAN 2.5 MG PO TABS: 2.5000 mg | ORAL_TABLET | Freq: Two times a day (BID) | ORAL | 0 refills | Status: DC

## 2023-05-16 MED ORDER — OXYCODONE HCL 5 MG PO TABS
10.0000 mg | ORAL_TABLET | Freq: Four times a day (QID) | ORAL | Status: DC | PRN
  Administered 2023-05-16 – 2023-05-17 (×4): 10 mg via ORAL
  Filled 2023-05-16 (×4): qty 2

## 2023-05-16 NOTE — Progress Notes (Signed)
Physical Therapy Treatment Patient Details Name: Colleen Gray MRN: [id number] DOB: 1951-12-06 Today's Date: 05/16/2023   History of Present Illness Pt is a 72 yo female s/p right reverse lateralized total Shoulder Arthroplasty. She is s/p L RTSA 8 years ago. PHMx: enlarged heart, HTN, sleep apnea, OA, and anemia    PT Comments    Pt progressing with PT, tol incr amb distance and demonstrated improved stability with single L hand RW  push. D/c plan remains appropriate.  Recommendations for follow up therapy are one component of a multi-disciplinary discharge planning process, led by the attending physician.  Recommendations may be updated based on patient status, additional functional criteria and insurance authorization.  Follow Up Recommendations  Can patient physically be transported by private vehicle: No    Assistance Recommended at Discharge Frequent or constant Supervision/Assistance  Patient can return home with the following A little help with walking and/or transfers;A little help with bathing/dressing/bathroom;Assistance with cooking/housework;Assist for transportation;Help with stairs or ramp for entrance   Equipment Recommendations  None recommended by PT    Recommendations for Other Services       Precautions / Restrictions Precautions Precautions: Shoulder Type of Shoulder Precautions: Sling at all times except ADL/exercise Yes   Non weight bearing Yes   AROM elbow, wrist and hand to tolerance Yes   PROM of shoulder No   AROM of shoulder No; OT Consult Special Instructions Sling training in addition; Shoulder Interventions: Shoulder sling/immobilizer Precaution Booklet Issued: Yes (comment) Precaution Comments: Per MD, ok to remove sling to help with balance when up ambulating Required Braces or Orthoses: Sling Restrictions Weight Bearing Restrictions: Yes RUE Weight Bearing: Non weight bearing Other Position/Activity Restrictions: PILLOW BEHIND RT UPPER ARM  WHENEVER IN BED OR CHAIR     Mobility  Bed Mobility Overal bed mobility: Needs Assistance Bed Mobility: Supine to Sit     Supine to sit: HOB elevated, Min assist     General bed mobility comments: cues to self assist, min assist to elevate trunk, incr time to scoot to EOB    Transfers Overall transfer level: Needs assistance Equipment used: Rolling walker (2 wheels) Transfers: Sit to/from Stand Sit to Stand: Min assist           General transfer comment: pt uses momentum to stand, bed ht elevated, cues to power up through LEs and use L UE    Ambulation/Gait Ambulation/Gait assistance: Min guard, Min assist Gait Distance (Feet): 50 Feet Assistive device: Rolling walker (2 wheels) Gait Pattern/deviations: Wide base of support, Step-through pattern, Decreased stride length Gait velocity: decr     General Gait Details: single L hand drive RW, occasional assist to steer RW an steady; overall min/guard with improved stability using RW   Stairs             Wheelchair Mobility    Modified Rankin (Stroke Patients Only)       Balance Overall balance assessment: Needs assistance Sitting-balance support: No upper extremity supported, Feet supported Sitting balance-Leahy Scale: Fair     Standing balance support: Single extremity supported, During functional activity Standing balance-Leahy Scale: Poor Standing balance comment: patient intermittently had to steady self when standing and manipulating clothes.                            Cognition Arousal/Alertness: Awake/alert Behavior During Therapy: WFL for tasks assessed/performed Overall Cognitive Status: Impaired/Different from baseline Area of Impairment: Memory  Memory: Decreased recall of precautions, Decreased short-term memory                  Exercises      General Comments        Pertinent Vitals/Pain Pain Assessment Pain Assessment: 0-10 Pain  Score: 5  Pain Descriptors / Indicators: Sore, Guarding, Grimacing Pain Intervention(s): Limited activity within patient's tolerance, Monitored during session, Premedicated before session, Repositioned    Home Living                          Prior Function            PT Goals (current goals can now be found in the care plan section) Acute Rehab PT Goals Patient Stated Goal: to  be able to take care of myself, PT Goal Formulation: With patient Time For Goal Achievement: 05/21/23 Potential to Achieve Goals: Good Progress towards PT goals: Progressing toward goals    Frequency    Min 5X/week      PT Plan Current plan remains appropriate    Co-evaluation              AM-PAC PT "6 Clicks" Mobility   Outcome Measure  Help needed turning from your back to your side while in a flat bed without using bedrails?: A Little Help needed moving from lying on your back to sitting on the side of a flat bed without using bedrails?: A Little Help needed moving to and from a bed to a chair (including a wheelchair)?: A Lot Help needed standing up from a chair using your arms (e.g., wheelchair or bedside chair)?: A Lot Help needed to walk in hospital room?: A Little Help needed climbing 3-5 steps with a railing? : A Lot 6 Click Score: 15    End of Session Equipment Utilized During Treatment: Gait belt Activity Tolerance: Patient tolerated treatment well Patient left: with call bell/phone within reach;with bed alarm set;with family/visitor present Nurse Communication: Mobility status PT Visit Diagnosis: Unsteadiness on feet (R26.81);Difficulty in walking, not elsewhere classified (R26.2)     Time: 1610-9604 PT Time Calculation (min) (ACUTE ONLY): 18 min  Charges:  $Gait Training: 8-22 mins                     Delice Bison, PT  Acute Rehab Dept ([facility]) [phone]  05/16/2023    [facility] 05/16/2023, 1:07 PM

## 2023-05-16 NOTE — Plan of Care (Signed)
  Problem: Education: Goal: Knowledge of the prescribed therapeutic regimen will improve Outcome: Progressing   Problem: Activity: Goal: Ability to tolerate increased activity will improve Outcome: Progressing   Problem: Pain Management: Goal: Pain level will decrease with appropriate interventions Outcome: Progressing   

## 2023-05-16 NOTE — Progress Notes (Addendum)
   ORTHOPAEDIC PROGRESS NOTE  s/p Procedure(s): REVERSE SHOULDER ARTHROPLASTY on 05/14/23  SUBJECTIVE: Patient having some more burning in her armpit today. Plan is for SNF. Waiting on bed availability.   No chest pain. No SOB. No nausea/vomiting. No other complaints.  OBJECTIVE: PE: General: sitting up in hospital bed, NAD Cardiac: regular rate Pulmonary: no increased work of breathing RUE: Dressing CDI and sling well fitting.. + Motor in  AIN, PIN, Ulnar distributions. Axillary nerve sensation preserved and symmetric.  Distal sensation grossly intact.  Well perfused digits.    Vitals:   05/15/23 2149 05/16/23 0536  BP: (!) 148/71 138/74  Pulse: 86 81  Resp: 19 18  Temp: 98.2 F (36.8 C) 98.9 F (37.2 C)  SpO2: 96% 95%   Stable post-op images.   ASSESSMENT: Anetta Vigo is a 72 y.o. female doing well postoperatively. POD#3  PLAN: Weightbearing: NWB RUE  - okay for ROM elbow/wrist/hand  - no passive or active ROM of the right shoulder at this time  - okay to use right arm for balance when weight bearing Insicional and dressing care: Reinforce dressings as needed Orthopedic device(s):  Sling Showering: post-op day #2 VTE prophylaxis: Heparin while inpatient transition to orals at discharge.  Pain control: PRN pain medication, minimize narcotics as able  - will discontinue IV pain medication.  Follow - up plan: 2 weeks Dispo: TBD. She has had a lot of difficulty with ambulation. She lives at home alone with limited care. Plan to discharge to SNF waiting on bed availability. Okay to discharge once bed is available. Medications printed and placed in patient's chart for discharge.  Contact information:  Weekdays 8-5 Dr. Ramond Marrow, Alfonse Alpers PA-C , After hours and holidays please check [email or web address] for group call information for [facility]   Alfonse Alpers, PA-C 05/16/2023

## 2023-05-17 NOTE — Progress Notes (Signed)
Occupational Therapy Treatment Patient Details Name: Colleen Gray MRN: [id number] DOB: 31-Jan-1951 Today's Date: 05/17/2023   History of present illness Pt is a 72 yo female s/p right reverse lateralized total Shoulder Arthroplasty. She is s/p L RTSA 8 years ago. PHMx: enlarged heart, HTN, sleep apnea, OA, and anemia   OT comments  Patient continues to need 24/7 supervision and consistent cues for maintaining RUE shoulder restrictions. Patient was noted to have improved ability to engage in transfers from bed to recliner compared to last session. Patient continues to have decreased caregiver assistance than needed at home. Patient would benefit from ST SNF to improve independence and increase safety in next level of care.    Recommendations for follow up therapy are one component of a multi-disciplinary discharge planning process, led by the attending physician.  Recommendations may be updated based on patient status, additional functional criteria and insurance authorization.    Assistance Recommended at Discharge Frequent or constant Supervision/Assistance  Patient can return home with the following  A little help with walking and/or transfers;A lot of help with bathing/dressing/bathroom;Assistance with cooking/housework;Help with stairs or ramp for entrance;Assist for transportation         Precautions / Restrictions Precautions Precautions: Shoulder Type of Shoulder Precautions: Sling at all times except ADL/exercise Yes   Non weight bearing Yes   AROM elbow, wrist and hand to tolerance Yes   PROM of shoulder No   AROM of shoulder No; OT Consult Special Instructions Sling training in addition; Shoulder Interventions: Shoulder sling/immobilizer Precaution Booklet Issued: Yes (comment) Precaution Comments: Per MD, ok to remove sling to help with balance when up ambulating Required Braces or Orthoses: Sling Restrictions Weight Bearing Restrictions: Yes RUE Weight Bearing: Non weight  bearing Other Position/Activity Restrictions: PILLOW BEHIND RT UPPER ARM WHENEVER IN BED OR CHAIR       Mobility Bed Mobility Overal bed mobility: Needs Assistance Bed Mobility: Supine to Sit     Supine to sit: HOB elevated, Min assist     General bed mobility comments: with increased time cues to maintain shoulder restrictions         Balance Overall balance assessment: Needs assistance Sitting-balance support: No upper extremity supported, Feet supported Sitting balance-Leahy Scale: Fair     Standing balance support: Single extremity supported, During functional activity Standing balance-Leahy Scale: Poor         ADL either performed or assessed with clinical judgement   ADL Overall ADL's : Needs assistance/impaired     Grooming: Sitting;Set up Grooming Details (indicate cue type and reason): with LUE and cues not to use RUE Upper Body Bathing: Sitting;Moderate assistance Upper Body Bathing Details (indicate cue type and reason): constant cues for management of RUE and how to maintain shoulder restrictions. patient needed 24/7 supervision with patient automatically attempting to abduct arm to complete hygiene underarm after being asked to wait while therapist was collecting new rag to demonstrate how to wash underarm. patient was max A for see saw method with constant cues for positioning and not to move RUE.     Upper Body Dressing : Maximal assistance;Sitting Upper Body Dressing Details (indicate cue type and reason): TD to adjust sling and don/doff hospital gown with education provided on process.     Toilet Transfer: Tax adviser Details (indicate cue type and reason): with personal cane to recliner in room with increased time and cues to keep cane with her to back up to chair prior to attempting to sit down.  Cognition Arousal/Alertness: Awake/alert Behavior During Therapy: WFL for tasks assessed/performed Overall  Cognitive Status: Impaired/Different from baseline       General Comments: patient was noted to have no carryover of education on what she was and was not allowed to do with R shoulder. needed constant cues during session to maintain precautions.                   Pertinent Vitals/ Pain       Pain Assessment Pain Assessment: 0-10 Faces Pain Scale: Hurts little more Pain Location: R shoulder Pain Descriptors / Indicators: Grimacing, Discomfort Pain Intervention(s): Limited activity within patient's tolerance, Monitored during session, Premedicated before session, Ice applied   Frequency  Min 2X/week        Progress Toward Goals  OT Goals(current goals can now be found in the care plan section)  Progress towards OT goals: Progressing toward goals (patients level of assistance to complete transfers has slightly improved with patient continuing to need cues for proper management of RUE.)     Plan Discharge plan remains appropriate       AM-PAC OT "6 Clicks" Daily Activity     Outcome Measure   Help from another person eating meals?: A Little Help from another person taking care of personal grooming?: A Lot Help from another person toileting, which includes using toliet, bedpan, or urinal?: A Lot Help from another person bathing (including washing, rinsing, drying)?: A Lot Help from another person to put on and taking off regular upper body clothing?: A Lot Help from another person to put on and taking off regular lower body clothing?: A Lot 6 Click Score: 13    End of Session Equipment Utilized During Treatment: Other (comment) (sling)  OT Visit Diagnosis: Unsteadiness on feet (R26.81);Other abnormalities of gait and mobility (R26.89);Muscle weakness (generalized) (M62.81);Pain Pain - Right/Left: Right Pain - part of body: Shoulder   Activity Tolerance Patient tolerated treatment well   Patient Left in chair;with call bell/phone within reach;with chair alarm set    Nurse Communication Mobility status        Time: 4098-1191 OT Time Calculation (min): 21 min  Charges: OT General Charges $OT Visit: 1 Visit OT Treatments $Self Care/Home Management : 8-22 mins Rosalio Loud, MS Acute Rehabilitation Department Office# [phone]    Selinda Flavin 05/17/2023, 9:59 AM

## 2023-05-17 NOTE — Plan of Care (Signed)
  Problem: Education: Goal: Knowledge of the prescribed therapeutic regimen will improve Outcome: Progressing   Problem: Pain Management: Goal: Pain level will decrease with appropriate interventions Outcome: Progressing   Problem: Activity: Goal: Risk for activity intolerance will decrease Outcome: Progressing   

## 2023-05-17 NOTE — Plan of Care (Signed)
  Problem: Pain Management: Goal: Pain level will decrease with appropriate interventions Outcome: Progressing   Problem: Coping: Goal: Level of anxiety will decrease Outcome: Progressing   

## 2023-05-17 NOTE — Progress Notes (Signed)
Subjective: Patient reports pain as moderate. Was worried about some redness in her anterior armpit area. Didn't know if irritation from bandage. Tolerating diet.  Urinating.  No CP, SOB.  Has mobilized more OOB with PT. Reports some muscle cramping at nighttime. Waiting on SNF placement.  Objective:   VITALS:   Vitals:   05/16/23 1402 05/16/23 2032 05/16/23 2120 05/17/23 0555  BP: (!) 127/54  (!) 143/67 (!) 127/59  Pulse: 68 74 75 89  Resp: 15  17 20   Temp: 98.4 F (36.9 C)  99.1 F (37.3 C) 99.6 F (37.6 C)  TempSrc: Oral  Oral Oral  SpO2: 95%  96% 97%  Weight:      Height:          Latest Ref Rng & Units 05/01/2023   11:43 AM 01/23/2023    8:07 AM 09/19/2022    8:20 AM  CBC  WBC 4.0 - 10.5 K/uL 5.6     Hemoglobin 12.0 - 15.0 g/dL 16.1  09.6  04.5   Hematocrit 36.0 - 46.0 % 36.8  36.0  36.0   Platelets 150 - 400 K/uL 197         Latest Ref Rng & Units 05/01/2023   11:43 AM 01/23/2023    8:07 AM 09/19/2022    8:20 AM  BMP  Glucose 70 - 99 mg/dL 409  811  914   BUN 8 - 23 mg/dL 782  77  956   Creatinine 0.44 - 1.00 mg/dL 2.13  0.86  5.78   Sodium 135 - 145 mmol/L 141  140  142   Potassium 3.5 - 5.1 mmol/L 4.2  3.9  3.8   Chloride 98 - 111 mmol/L 104  102  107   CO2 22 - 32 mmol/L 25     Calcium 8.9 - 10.3 mg/dL 9.0      Intake/Output      05/25 0701 05/26 0700 05/26 0701 05/27 0700   P.O. 1080    Total Intake(mL/kg) 1080 (10.1)    Urine (mL/kg/hr) 1540 (0.6)    Stool 0    Total Output 1540    Net -460         Urine Occurrence 5 x    Stool Occurrence 0 x       Physical Exam: General: NAD.  Laying in bed, dozing off, calm, comfortable Resp: No increased wob Cardio: regular rate and rhythm ABD soft Neurologically intact MSK Neurovascularly intact Sensation intact distally Intact pulses distally Wrist & hand ROM intact Edema to R hand  Incision: dressing C/D/I Sling RUE, Ice to R shoulder No erythema seen currently, also checked the axilla which  is clean and dry  Assessment: 4 Days Post-Op  S/P Procedure(s) (LRB): REVERSE SHOULDER ARTHROPLASTY (Right) by Dr. Everardo Pacific on 05/13/23  Principal Problem:   Status post reverse total arthroplasty of right shoulder Active Problems:   S/p reverse total shoulder arthroplasty   Plan: Weightbearing: NWB RUE             - okay for ROM elbow/wrist/hand             - no passive or active ROM of the right shoulder at this time             - okay to use right arm for balance when weight bearing Insicional and dressing care: Reinforce dressings as needed Orthopedic device(s):  Sling Showering: post-op day #2 VTE prophylaxis: Heparin while inpatient transition to orals at discharge.  Pain control: PRN pain medication, minimize narcotics as able             - will discontinue IV pain medication.  Follow - up plan: 2 weeks Dispo: She has had a lot of difficulty with ambulation. She lives at home alone with limited care. Plan to discharge to SNF waiting on bed availability. Okay to discharge once bed is available. Medications printed and placed in patient's chart for discharge.   Jenne Pane, PA-C Office [phone] 05/17/2023, 7:49 AM

## 2023-05-17 NOTE — Progress Notes (Signed)
Physical Therapy Treatment Patient Details Name: Colleen Gray MRN: [id number] DOB: 27-Jan-1951 Today's Date: 05/17/2023   History of Present Illness Pt is a 72 yo female s/p right reverse lateralized total Shoulder Arthroplasty. She is s/p L RTSA 8 years ago. PHMx: enlarged heart, HTN, sleep apnea, OA, and anemia    PT Comments    Pt demonstrates incr activity tol, able to amb greater distance with one brief standing rest d/t fatgiue. requires cues to adhere to NWB RUE with bed mobility.  D/c plan remains appropriate  Recommendations for follow up therapy are one component of a multi-disciplinary discharge planning process, led by the attending physician.  Recommendations may be updated based on patient status, additional functional criteria and insurance authorization.  Follow Up Recommendations  Can patient physically be transported by private vehicle: No    Assistance Recommended at Discharge Frequent or constant Supervision/Assistance  Patient can return home with the following A little help with walking and/or transfers;A little help with bathing/dressing/bathroom;Assistance with cooking/housework;Assist for transportation;Help with stairs or ramp for entrance   Equipment Recommendations  None recommended by PT    Recommendations for Other Services       Precautions / Restrictions Precautions Precautions: Shoulder Type of Shoulder Precautions: Sling at all times except ADL/exercise Yes   Non weight bearing Yes   AROM elbow, wrist and hand to tolerance Yes   PROM of shoulder No   AROM of shoulder No; OT Consult Special Instructions Sling training in addition; Shoulder Interventions: Shoulder sling/immobilizer Precaution Booklet Issued: Yes (comment) Precaution Comments: Per MD, ok to remove sling to help with balance when up ambulating Required Braces or Orthoses: Sling Restrictions Weight Bearing Restrictions: Yes RUE Weight Bearing: Non weight bearing Other  Position/Activity Restrictions: PILLOW BEHIND RT UPPER ARM WHENEVER IN BED OR CHAIR     Mobility  Bed Mobility Overal bed mobility: Needs Assistance Bed Mobility: Supine to Sit, Sit to Supine     Supine to sit: HOB elevated, Min guard Sit to supine: Min assist   General bed mobility comments: cues to self assist, min assist to elevate trunk, incr time to scoot to EOB; assist to bring bil LEs on to bed, good pt effort, cues for NWB R elbow/shoulder    Transfers Overall transfer level: Needs assistance Equipment used: Rolling walker (2 wheels) Transfers: Sit to/from Stand Sit to Stand: Min assist           General transfer comment: pt uses momentum to stand, bed ht elevated, cues to power up through LEs and use L UE    Ambulation/Gait Ambulation/Gait assistance: Min guard, Min assist Gait Distance (Feet): 65 Feet Assistive device: Rolling walker (2 wheels) Gait Pattern/deviations: Wide base of support, Step-through pattern, Decreased stride length Gait velocity: decr     General Gait Details: single L hand drive RW, occasional assist to steer RW an steady; overall min/guard with improved stability using RW; one brief standing rest   Stairs             Wheelchair Mobility    Modified Rankin (Stroke Patients Only)       Balance Overall balance assessment: Needs assistance Sitting-balance support: No upper extremity supported, Feet supported Sitting balance-Leahy Scale: Fair     Standing balance support: Single extremity supported, During functional activity Standing balance-Leahy Scale: Poor Standing balance comment: compensatory wide BOS  Cognition Arousal/Alertness: Awake/alert Behavior During Therapy: WFL for tasks assessed/performed Overall Cognitive Status: Impaired/Different from baseline Area of Impairment: Memory                     Memory: Decreased recall of precautions, Decreased short-term  memory                  Exercises      General Comments        Pertinent Vitals/Pain Pain Assessment Pain Score: 2  Pain Location: R shoulder Pain Descriptors / Indicators: Sore, Guarding, Grimacing Pain Intervention(s): Limited activity within patient's tolerance, Monitored during session, Ice applied    Home Living                          Prior Function            PT Goals (current goals can now be found in the care plan section) Acute Rehab PT Goals Patient Stated Goal: to  be able to take care of myself, PT Goal Formulation: With patient Time For Goal Achievement: 05/21/23 Potential to Achieve Goals: Good Progress towards PT goals: Progressing toward goals    Frequency    Min 5X/week      PT Plan Current plan remains appropriate    Co-evaluation              AM-PAC PT "6 Clicks" Mobility   Outcome Measure  Help needed turning from your back to your side while in a flat bed without using bedrails?: A Little Help needed moving from lying on your back to sitting on the side of a flat bed without using bedrails?: A Little Help needed moving to and from a bed to a chair (including a wheelchair)?: A Lot Help needed standing up from a chair using your arms (e.g., wheelchair or bedside chair)?: A Lot Help needed to walk in hospital room?: A Little Help needed climbing 3-5 steps with a railing? : A Lot 6 Click Score: 15    End of Session Equipment Utilized During Treatment: Gait belt Activity Tolerance: Patient tolerated treatment well Patient left: with call bell/phone within reach;with bed alarm set;with family/visitor present;in bed Nurse Communication: Mobility status PT Visit Diagnosis: Unsteadiness on feet (R26.81);Difficulty in walking, not elsewhere classified (R26.2)     Time: 1308-6578 PT Time Calculation (min) (ACUTE ONLY): 18 min  Charges:  $Gait Training: 8-22 mins                     Delice Bison, PT  Acute Rehab Dept  [facility]) [phone]  05/17/2023    [facility] 05/17/2023, 2:55 PM

## 2023-05-18 NOTE — Progress Notes (Signed)
Patient with oral temperature of 101.4, noted to be extremely hot in the room, heat turned down, iceman refilled, cool washcloth given for head, patient instructed to utilize incentive spirometer, no prn order for tylenol at this time, will have dayshift recheck temperature and will notify dayshift RN to obtain prn order from rounding provider this morning.

## 2023-05-18 NOTE — Plan of Care (Signed)
  Problem: Education: Goal: Understanding of activity limitations/precautions following surgery will improve Outcome: Progressing   Problem: Activity: Goal: Ability to tolerate increased activity will improve Outcome: Progressing   Problem: Pain Management: Goal: Pain level will decrease with appropriate interventions Outcome: Progressing   

## 2023-05-18 NOTE — Discharge Summary (Cosign Needed Addendum)
Patient ID: Colleen Gray MRN: [id number] DOB/AGE: 1951-07-06 72 y.o.  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 Jacinto Halim   Heart murmur    due to rheumatic fever - Sees Dr Jacinto Halim   Hemorrhoid    History of kidney stones    History of rheumatic fever    with a heart murmer   Hypertension    Ovarian cyst 1985   removed   Pneumonia    Pre-diabetes    no meds   Psoriasis    Sleep apnea    does not use Cpap (after weight loss no longer needed)     Procedures Performed: Right reverse lateralized total Shoulder Arthroplasty on 5/22 with Dr. Everardo Pacific  Discharged Condition: stable  Hospital Course: Patient brought in as an outpatient for surgery.  She tolerated procedure well.  She was kept for monitoring overnight for pain control, medical monitoring postop, and discharge planning. It was recommended from PT/OT that the safest option for the patient's discharge was SNF. She had a difficult time progressing with her mobility. She lives at home with limited support. She was found to be stable for DC to SNF on 05/19/23.  Patient was instructed on specific activity restrictions and all questions were answered.  Consults: PT/OT  Significant Diagnostic Studies: No additional pertinent studies  Treatments: Surgery  Discharge Exam:  General: NAD.  Laying in bed, calm, comfortable Resp: No increased wob Cardio: regular rate and  rhythm ABD soft Neurologically intact MSK Neurovascularly intact Sensation intact distally Intact pulses distally Wrist & hand ROM intact Edema to R hand  Incision: dressing C/D/I Sling RUE, Ice to R shoulder No erythema seen currently, also checked the axilla which is clean and dry  Disposition: Discharge disposition: 03-Skilled Nursing Facility       Discharge Instructions     Call MD for:  redness, tenderness, or signs of infection (pain, swelling, redness, odor or green/yellow discharge around incision site)   Complete by: As directed    Call MD for:  redness, tenderness, or signs of infection (pain, swelling, redness, odor or green/yellow discharge around incision site)   Complete by: As directed    Call MD for:  severe uncontrolled pain   Complete by: As directed    Call MD for:  severe uncontrolled pain   Complete by: As directed    Call MD for:  temperature >100.4   Complete by: As directed    Call MD for:  temperature >100.4   Complete by: As directed    Diet - low sodium heart healthy   Complete by: As directed    Diet - low sodium heart healthy   Complete by: As directed       Allergies as of 05/18/2023       Reactions   Nifedipine Swelling   Other    Surgery Glue caused blisters  Penicillins Rash        Medication List     TAKE these medications    acetaminophen 500 MG tablet Commonly known as: TYLENOL Take 2 tablets (1,000 mg total) by mouth every 8 (eight) hours for 14 days.   Alpha-Lipoic Acid 300 MG Caps Take 300 mg by mouth daily.   amLODipine 10 MG tablet Commonly known as: NORVASC Take 10 mg by mouth daily.   apixaban 2.5 MG Tabs tablet Commonly known as: Eliquis Take 1 tablet (2.5 mg total) by mouth 2 (two) times daily.   ascorbic acid 500 MG tablet Commonly known as: VITAMIN C Take 500 mg by mouth daily.   calcitRIOL 0.5 MCG capsule Commonly known as: ROCALTROL Take 0.5 mcg by mouth daily.   cyanocobalamin 1000 MCG  tablet Commonly known as: VITAMIN B12 Take 1,000 mcg by mouth every Monday, Wednesday, and Friday.   ferrous sulfate 325 (65 FE) MG tablet Take 325 mg by mouth daily with breakfast.   folic acid 1 MG tablet Commonly known as: FOLVITE Take 1 mg by mouth daily.   furosemide 40 MG tablet Commonly known as: LASIX Take 40 mg by mouth daily.   GlucoCom Blood Glucose Monitor Devi Use daily to check blood sugar.   labetalol 200 MG tablet Commonly known as: NORMODYNE Take 200 mg by mouth 2 (two) times daily.   multivitamin with minerals Tabs tablet Take 1 tablet by mouth daily.   nitroGLYCERIN 0.4 MG SL tablet Commonly known as: NITROSTAT Place 0.4 mg under the tongue every 5 (five) minutes as needed for chest pain.   Omega 3 1000 MG Caps Take 1,000 mg by mouth daily.   ondansetron 4 MG tablet Commonly known as: Zofran Take 1 tablet (4 mg total) by mouth every 8 (eight) hours as needed for up to 7 days for nausea or vomiting.   OVER THE COUNTER MEDICATION Take 1 capsule by mouth daily. Fruit and Vegetable Blend   oxyCODONE 5 MG immediate release tablet Commonly known as: Oxy IR/ROXICODONE Take 1 - 2 tablets by mouth every 6 hours as needed for severe pain, no more than 6 tablets per day   sevelamer carbonate 800 MG tablet Commonly known as: RENVELA Take 1,600 mg by mouth 3 (three) times daily with meals.   sodium bicarbonate 650 MG tablet Take 650 mg by mouth 3 (three) times daily.   TUMS ULTRA 1000 PO Take 1,000 mg by mouth 3 (three) times daily.   Vitamin D3 250 MCG (10000 UT) capsule Take 10,000 Units by mouth every Tuesday.               Durable Medical Equipment  (From admission, onward)           Start     Ordered   05/14/23 1356  For home use only DME 3 n 1  Once        05/14/23 1355            Follow-up Information     [facility] Follow up.   Why: [facility] will provide PT, OT, and aide services in the home after  discharge.               Alfonse Alpers, PA-C 05/18/23

## 2023-05-18 NOTE — Plan of Care (Signed)
  Problem: Pain Management: Goal: Pain level will decrease with appropriate interventions Outcome: Progressing   Problem: Coping: Goal: Level of anxiety will decrease Outcome: Progressing   

## 2023-05-18 NOTE — Progress Notes (Signed)
Physical Therapy Treatment Patient Details Name: Colleen Gray MRN: [id number] DOB: [birth year] Today's Date: 05/18/2023   History of Present Illness Pt is a 72 yo female s/p right reverse lateralized total Shoulder Arthroplasty. She is s/p L RTSA 8 years ago. PHMx: enlarged heart, HTN, sleep apnea, OA, and anemia    PT Comments    Pt continues to progress with PT; tol incr amb distance, minimal DOE however did have LOB x1 and needed 2 standing rest breaks d/t fatigue;  decr assist needed with bed mobility. D/c plan remains appropriate.  Recommendations for follow up therapy are one component of a multi-disciplinary discharge planning process, led by the attending physician.  Recommendations may be updated based on patient status, additional functional criteria and insurance authorization.  Follow Up Recommendations  Can patient physically be transported by private vehicle: No    Assistance Recommended at Discharge Frequent or constant Supervision/Assistance  Patient can return home with the following A little help with walking and/or transfers;A little help with bathing/dressing/bathroom;Assistance with cooking/housework;Assist for transportation;Help with stairs or ramp for entrance   Equipment Recommendations  None recommended by PT    Recommendations for Other Services       Precautions / Restrictions Precautions Precautions: Shoulder Type of Shoulder Precautions: Sling at all times except ADL/exercise Yes   Non weight bearing Yes   AROM elbow, wrist and hand to tolerance Yes   PROM of shoulder No   AROM of shoulder No; OT Consult Special Instructions Sling training in addition; Shoulder Interventions: Shoulder sling/immobilizer Precaution Booklet Issued: Yes (comment) Precaution Comments: Per MD, ok to remove sling to help with balance when up ambulating Required Braces or Orthoses: Sling Restrictions Weight Bearing Restrictions: Yes RUE Weight Bearing: Non weight  bearing Other Position/Activity Restrictions: PILLOW BEHIND RT UPPER ARM WHENEVER IN BED OR CHAIR     Mobility  Bed Mobility Overal bed mobility: Needs Assistance Bed Mobility: Supine to Sit, Sit to Supine     Supine to sit: HOB elevated, Min guard     General bed mobility comments: cues to self assist,  incr time to scoot to EOB; good pt effort, min cues for NWB R elbow/shoulder    Transfers Overall transfer level: Needs assistance Equipment used: Rolling walker (2 wheels) Transfers: Sit to/from Stand Sit to Stand: Min assist           General transfer comment: pt uses momentum to stand, bed ht elevated, cues to power up through LEs and use of  L UE; LOB x1 with min assist to recover    Ambulation/Gait Ambulation/Gait assistance: Min guard, Min assist Gait Distance (Feet): 80 Feet Assistive device: Rolling walker (2 wheels) Gait Pattern/deviations: Wide base of support, Step-through pattern, Decreased stride length Gait velocity: decr     General Gait Details: single L hand drive RW, occasional assist to steer RW and steady; overall min/guard with improved stability using RW; 2  brief standing rest   Stairs             Wheelchair Mobility    Modified Rankin (Stroke Patients Only)       Balance Overall balance assessment: Needs assistance Sitting-balance support: No upper extremity supported, Feet supported Sitting balance-Leahy Scale: Fair     Standing balance support: Single extremity supported, During functional activity Standing balance-Leahy Scale: Poor Standing balance comment: compensatory wide BOS  Cognition Arousal/Alertness: Awake/alert Behavior During Therapy: WFL for tasks assessed/performed Overall Cognitive Status: Impaired/Different from baseline                                 General Comments: overall demonstrates carryover for NWB RUE with bed mobility        Exercises       General Comments        Pertinent Vitals/Pain Pain Assessment Pain Assessment: 0-10 Pain Score: 1  Pain Location: R shoulder Pain Descriptors / Indicators: Sore, Guarding, Grimacing Pain Intervention(s): Limited activity within patient's tolerance, Monitored during session, Premedicated before session, Repositioned    Home Living                          Prior Function            PT Goals (current goals can now be found in the care plan section) Acute Rehab PT Goals Patient Stated Goal: to  be able to take care of myself, PT Goal Formulation: With patient Time For Goal Achievement: 05/21/23 Potential to Achieve Goals: Good Progress towards PT goals: Progressing toward goals    Frequency    Min 5X/week      PT Plan Current plan remains appropriate    Co-evaluation              AM-PAC PT "6 Clicks" Mobility   Outcome Measure  Help needed turning from your back to your side while in a flat bed without using bedrails?: A Little Help needed moving from lying on your back to sitting on the side of a flat bed without using bedrails?: A Little Help needed moving to and from a bed to a chair (including a wheelchair)?: A Lot Help needed standing up from a chair using your arms (e.g., wheelchair or bedside chair)?: A Lot Help needed to walk in hospital room?: A Little Help needed climbing 3-5 steps with a railing? : A Lot 6 Click Score: 15    End of Session Equipment Utilized During Treatment: Gait belt Activity Tolerance: Patient tolerated treatment well Patient left: with call bell/phone within reach;in chair;with chair alarm set Nurse Communication: Mobility status PT Visit Diagnosis: Unsteadiness on feet (R26.81);Difficulty in walking, not elsewhere classified (R26.2)     Time: 4098-1191 PT Time Calculation (min) (ACUTE ONLY): 25 min  Charges:  $Gait Training: 23-37 mins                     Kairi Tufo, PT  Acute Rehab Dept [facility])  [phone]  05/18/2023    [facility] 05/18/2023, 11:43 AM

## 2023-05-18 NOTE — TOC Progression Note (Signed)
Transition of Care [facility]) - Progression Note   Patient Details  Name: Colleen Gray MRN: [id number] Date of Birth: [birth year]  Transition of Care [facility]) CM/SW Contact  Ewing Schlein, LCSW Phone Number: 05/18/2023, 10:49 AM  Clinical Narrative: CSW provided bed offers to patient and she chose [facility] due to higher Medicare star rating. CSW confirmed bed with [facility] in admissions, but the facility cannot accept the patient until tomorrow. CSW updated LPN and patient. Ortho aware patient can now discharge tomorrow instead of today.    Expected Discharge Plan: Home w Home Health Services Barriers to Discharge: No Barriers Identified  Expected Discharge Plan and Services In-house Referral: Clinical Social Work Post Acute Care Choice: Home Health Living arrangements for the past 2 months: Single Family Home Expected Discharge Date: 05/18/23               DME Arranged: N/A DME Agency: NA HH Arranged: PT, OT, Nurse's Aide HH Agency: [facility] Date HH Agency Contacted: 05/14/23 Time HH Agency Contacted: 1513 Representative spoke with at [facility] Agency: Morrie Sheldon  Social Determinants of Health (SDOH) Interventions SDOH Screenings   Food Insecurity: No Food Insecurity (05/15/2023)  Housing: Patient Declined (05/15/2023)  Transportation Needs: No Transportation Needs (05/15/2023)  Utilities: Not At Risk (05/15/2023)  Tobacco Use: Low Risk  (05/15/2023)   Readmission Risk Interventions     No data to display

## 2023-05-19 NOTE — Plan of Care (Signed)
Problem: Education: Goal: Knowledge of the prescribed therapeutic regimen will improve Outcome: Progressing   Problem: Activity: Goal: Ability to tolerate increased activity will improve Outcome: Progressing   Problem: Pain Management: Goal: Pain level will decrease with appropriate interventions Outcome: Progressing   Problem: Safety: Goal: Ability to remain free from injury will improve Outcome: Progressing   Haydee Salter, RN 05/19/23 9:28 AM

## 2023-05-19 NOTE — Care Management Important Message (Signed)
Important Message  Patient Details IM Letter given Name: Colleen Gray MRN: [id number] Date of Birth: 08-16-1951   Medicare Important Message Given:  Yes     Caren Macadam 05/19/2023, 9:48 AM

## 2023-05-19 NOTE — TOC Transition Note (Signed)
Transition of Care [facility]) - CM/SW Discharge Note  Patient Details  Name: Colleen Gray MRN: [id number] Date of Birth: 06/10/1951  Transition of Care [facility]) CM/SW Contact:  Ewing Schlein, LCSW Phone Number: 05/19/2023, 11:04 AM  Clinical Narrative: Patient will go to room 902P and the number for report is [phone] at [facility]. Discharge summary, discharge orders, and SNF transfer report faxed to facility in hub. Medical necessity form done; PTAR scheduled. Discharge packet completed. CSW notified Morrie Sheldon with [facility] of discharge so HH can start after rehab. RN updated. TOC signing off.   Final next level of care: Skilled Nursing Facility Barriers to Discharge: Barriers Resolved  Patient Goals and CMS Choice CMS Medicare.gov Compare Post Acute Care list provided to:: Patient Choice offered to / list presented to : Patient  Discharge Placement PASRR number recieved: 05/15/23      Patient chooses bed at: [facility] Patient to be transferred to facility by: PTAR Patient and family notified of of transfer: 05/19/23  Discharge Plan and Services Additional resources added to the After Visit Summary for   In-house Referral: Clinical Social Work Post Acute Care Choice: Home Health          DME Arranged: N/A DME Agency: NA HH Arranged: PT, OT, Nurse's Aide HH Agency: [facility] Date HH Agency Contacted: 05/14/23 Time HH Agency Contacted: 1513 Representative spoke with at [facility] Agency: Morrie Sheldon  Social Determinants of Health (SDOH) Interventions SDOH Screenings   Food Insecurity: No Food Insecurity (05/15/2023)  Housing: Patient Declined (05/15/2023)  Transportation Needs: No Transportation Needs (05/15/2023)  Utilities: Not At Risk (05/15/2023)  Tobacco Use: Low Risk  (05/15/2023)   Readmission Risk Interventions     No data to display

## 2023-05-19 NOTE — Progress Notes (Signed)
Physical Therapy Treatment Patient Details Name: Colleen Gray MRN: [id number] DOB: [birth year] Today's Date: 05/19/2023   History of Present Illness Pt is a 72 yo female s/p right reverse lateralized total Shoulder Arthroplasty. She is s/p L RTSA 8 years ago. PHMx: enlarged heart, HTN, sleep apnea, OA, and anemia    PT Comments    Pt progressing toward goals. Pt is pleasant, cooperative and always willing to work with PT; reports shoulder pain improved, having some incr pain L knee with mobility; overall supervision with bed mobility, min assist with transfers and gait. Pt tol R hand on RW for balance without incr pain this session, gait stability improved.    Recommendations for follow up therapy are one component of a multi-disciplinary discharge planning process, led by the attending physician.  Recommendations may be updated based on patient status, additional functional criteria and insurance authorization.  Follow Up Recommendations  Can patient physically be transported by private vehicle: No    Assistance Recommended at Discharge Frequent or constant Supervision/Assistance  Patient can return home with the following A little help with walking and/or transfers;A little help with bathing/dressing/bathroom;Assistance with cooking/housework;Assist for transportation;Help with stairs or ramp for entrance   Equipment Recommendations  None recommended by PT    Recommendations for Other Services       Precautions / Restrictions Precautions Precautions: Shoulder Type of Shoulder Precautions: Sling at all times except ADL/exercise Yes   Non weight bearing Yes   AROM elbow, wrist and hand to tolerance Yes   PROM of shoulder No   AROM of shoulder No; OT Consult Special Instructions Sling training in addition; Shoulder Interventions: Shoulder sling/immobilizer Precaution Booklet Issued: Yes (comment) Precaution Comments: Per MD, ok to remove sling to help with balance when up  ambulating Required Braces or Orthoses: Sling Restrictions Weight Bearing Restrictions: Yes RUE Weight Bearing: Non weight bearing Other Position/Activity Restrictions: PILLOW BEHIND RT UPPER ARM WHENEVER IN BED OR CHAIR     Mobility  Bed Mobility Overal bed mobility: Needs Assistance Bed Mobility: Supine to Sit     Supine to sit: HOB elevated, Supervision     General bed mobility comments: cues to self assist,  incr time to scoot to EOB; good pt effort, min cues for NWB R elbow/shoulder; supervision for safety; cues to complete scooting to EOB in sitting    Transfers Overall transfer level: Needs assistance Equipment used: Rolling walker (2 wheels) Transfers: Sit to/from Stand Sit to Stand: Min assist, From elevated surface           General transfer comment: pt uses momentum to stand, bed ht elevated, cues to power up through LEs and use of  L UE; LOB with initial standing d/t pt's personal; cane not locked into position; pt able to maintain standing while PT adjusted/corrected pt cane    Ambulation/Gait Ambulation/Gait assistance: Min assist Gait Distance (Feet): 80 Feet Assistive device: Rolling walker (2 wheels), Straight cane Gait Pattern/deviations: Wide base of support, Step-through pattern, Decreased stride length Gait velocity: decr     General Gait Details: attempted with cane however pt very unsteady, having incr L knee pain so transitioned to RW, allowed pt to rest R hand on RW for balance (OK per Dr Everardo Pacific). incr stability with RW, 2 standing rests d/t fatigue; LOB x 2 with min assist to recover   Stairs             Wheelchair Mobility    Modified Rankin (Stroke Patients Only)  Balance Overall balance assessment: Needs assistance Sitting-balance support: No upper extremity supported, Feet supported Sitting balance-Leahy Scale: Fair     Standing balance support: Single extremity supported, During functional activity Standing  balance-Leahy Scale: Poor Standing balance comment: compensatory wide BOS                            Cognition Arousal/Alertness: Awake/alert Behavior During Therapy: WFL for tasks assessed/performed Overall Cognitive Status: Impaired/Different from baseline                                 General Comments: demonstrates carryover for NWB RUE with bed mobility        Exercises General Exercises - Lower Extremity Ankle Circles/Pumps: AROM, Both, 10 reps Quad Sets: AROM, Both, 10 reps    General Comments        Pertinent Vitals/Pain Pain Assessment Pain Assessment: Faces Faces Pain Scale: Hurts a little bit Pain Location: R shoulder and L knee Pain Descriptors / Indicators: Sore, Guarding, Grimacing Pain Intervention(s): Limited activity within patient's tolerance, Monitored during session, Repositioned, Ice applied    Home Living                          Prior Function            PT Goals (current goals can now be found in the care plan section) Acute Rehab PT Goals Patient Stated Goal: to  be able to take care of myself, PT Goal Formulation: With patient Time For Goal Achievement: 05/21/23 Potential to Achieve Goals: Good Progress towards PT goals: Progressing toward goals    Frequency    Min 5X/week      PT Plan Current plan remains appropriate    Co-evaluation              AM-PAC PT "6 Clicks" Mobility   Outcome Measure  Help needed turning from your back to your side while in a flat bed without using bedrails?: A Little Help needed moving from lying on your back to sitting on the side of a flat bed without using bedrails?: A Little Help needed moving to and from a bed to a chair (including a wheelchair)?: A Lot Help needed standing up from a chair using your arms (e.g., wheelchair or bedside chair)?: A Lot Help needed to walk in hospital room?: A Little Help needed climbing 3-5 steps with a railing? : A  Lot 6 Click Score: 15    End of Session Equipment Utilized During Treatment: Gait belt Activity Tolerance: Patient tolerated treatment well Patient left: with call bell/phone within reach;in chair;with chair alarm set Nurse Communication: Mobility status PT Visit Diagnosis: Unsteadiness on feet (R26.81);Difficulty in walking, not elsewhere classified (R26.2)     Time: 1610-9604 PT Time Calculation (min) (ACUTE ONLY): 26 min  Charges:  $Gait Training: 23-37 mins                     Colleen Gray, PT  Acute Rehab Dept [facility]) [phone]  05/19/2023    [facility] 05/19/2023, 10:42 AM

## 2023-05-19 NOTE — Plan of Care (Signed)
Patient discharging to [facility] via [place]. Report called to Patsy Lager, Nurse at facility. Haydee Salter, RN 05/19/23 11:45 AM

## 2023-07-02 ENCOUNTER — Encounter (HOSPITAL_COMMUNITY)
Admission: RE | Admit: 2023-07-02 | Discharge: 2023-07-02 | Disposition: A | Discharge status: Home / Self Care | Payer: Medicare Other | Source: Ambulatory Visit | Attending: Nephrology | Admitting: Nephrology

## 2023-07-02 VITALS — BP 158/67 | HR 60 | Resp 17

## 2023-07-02 DIAGNOSIS — D631 Anemia in chronic kidney disease: Secondary | ICD-10-CM | POA: Insufficient documentation

## 2023-07-02 DIAGNOSIS — N185 Chronic kidney disease, stage 5: Secondary | ICD-10-CM | POA: Diagnosis present

## 2023-07-02 LAB — POCT HEMOGLOBIN-HEMACUE: Hemoglobin: 9.6 g/dL — ABNORMAL LOW (ref 12.0–15.0)

## 2023-07-02 MED ORDER — EPOETIN ALFA-EPBX 10000 UNIT/ML IJ SOLN
INTRAMUSCULAR | Status: AC
  Filled 2023-07-02: qty 1

## 2023-07-02 MED ORDER — EPOETIN ALFA-EPBX 10000 UNIT/ML IJ SOLN
10000.0000 [IU] | INTRAMUSCULAR | Status: DC
  Administered 2023-07-02: 10000 [IU] via SUBCUTANEOUS

## 2023-07-16 ENCOUNTER — Encounter (HOSPITAL_COMMUNITY)
Admission: RE | Admit: 2023-07-16 | Discharge: 2023-07-16 | Disposition: A | Discharge status: Home / Self Care | Payer: Medicare Other | Source: Ambulatory Visit | Attending: Nephrology | Admitting: Nephrology

## 2023-07-16 VITALS — BP 136/53 | HR 58 | Temp 97.3°F | Resp 18

## 2023-07-16 DIAGNOSIS — N185 Chronic kidney disease, stage 5: Secondary | ICD-10-CM

## 2023-07-16 LAB — POCT HEMOGLOBIN-HEMACUE: Hemoglobin: 9.6 g/dL — ABNORMAL LOW (ref 12.0–15.0)

## 2023-07-16 MED ORDER — EPOETIN ALFA-EPBX 10000 UNIT/ML IJ SOLN
INTRAMUSCULAR | Status: AC
  Filled 2023-07-16: qty 1

## 2023-07-16 MED ORDER — EPOETIN ALFA-EPBX 10000 UNIT/ML IJ SOLN
10000.0000 [IU] | INTRAMUSCULAR | Status: DC
  Administered 2023-07-16: 10000 [IU] via SUBCUTANEOUS

## 2023-07-30 ENCOUNTER — Ambulatory Visit (HOSPITAL_COMMUNITY)
Admission: RE | Admit: 2023-07-30 | Discharge: 2023-07-30 | Disposition: A | Discharge status: Home / Self Care | Payer: Medicare Other | Source: Ambulatory Visit | Attending: Nephrology | Admitting: Nephrology

## 2023-07-30 VITALS — BP 161/66 | HR 58 | Temp 97.3°F | Resp 18

## 2023-07-30 DIAGNOSIS — D631 Anemia in chronic kidney disease: Secondary | ICD-10-CM | POA: Diagnosis not present

## 2023-07-30 DIAGNOSIS — N185 Chronic kidney disease, stage 5: Secondary | ICD-10-CM | POA: Insufficient documentation

## 2023-07-30 LAB — IRON AND TIBC
Iron: 32 ug/dL (ref 28–170)
Saturation Ratios: 8 % — ABNORMAL LOW (ref 10.4–31.8)
TIBC: 381 ug/dL (ref 250–450)
UIBC: 349 ug/dL

## 2023-07-30 LAB — POCT HEMOGLOBIN-HEMACUE: Hemoglobin: 9.8 g/dL — ABNORMAL LOW (ref 12.0–15.0)

## 2023-07-30 LAB — FERRITIN: Ferritin: 39 ng/mL (ref 11–307)

## 2023-07-30 MED ORDER — EPOETIN ALFA-EPBX 10000 UNIT/ML IJ SOLN
INTRAMUSCULAR | Status: AC
  Administered 2023-07-30: 10000 [IU] via SUBCUTANEOUS
  Filled 2023-07-30: qty 1

## 2023-07-30 MED ORDER — EPOETIN ALFA-EPBX 10000 UNIT/ML IJ SOLN: 10000.0000 [IU] | INTRAMUSCULAR | Status: DC

## 2023-08-13 ENCOUNTER — Other Ambulatory Visit (HOSPITAL_COMMUNITY): Payer: Self-pay

## 2023-08-13 ENCOUNTER — Encounter (HOSPITAL_COMMUNITY): Payer: Medicare Other

## 2023-08-13 ENCOUNTER — Other Ambulatory Visit (HOSPITAL_BASED_OUTPATIENT_CLINIC_OR_DEPARTMENT_OTHER): Payer: Self-pay

## 2023-08-13 ENCOUNTER — Encounter (HOSPITAL_COMMUNITY)
Admission: RE | Admit: 2023-08-13 | Discharge: 2023-08-13 | Disposition: A | Discharge status: Home / Self Care | Payer: Medicare Other | Source: Ambulatory Visit | Attending: Nephrology | Admitting: Nephrology

## 2023-08-13 VITALS — BP 155/53 | HR 57 | Temp 97.0°F | Resp 17

## 2023-08-13 DIAGNOSIS — N185 Chronic kidney disease, stage 5: Secondary | ICD-10-CM | POA: Insufficient documentation

## 2023-08-13 DIAGNOSIS — D631 Anemia in chronic kidney disease: Secondary | ICD-10-CM | POA: Insufficient documentation

## 2023-08-13 LAB — POCT HEMOGLOBIN-HEMACUE: Hemoglobin: 10.1 g/dL — ABNORMAL LOW (ref 12.0–15.0)

## 2023-08-13 MED ORDER — EPOETIN ALFA-EPBX 10000 UNIT/ML IJ SOLN
INTRAMUSCULAR | Status: AC
  Filled 2023-08-13: qty 1

## 2023-08-13 MED ORDER — EPOETIN ALFA-EPBX 10000 UNIT/ML IJ SOLN
10000.0000 [IU] | INTRAMUSCULAR | Status: DC
  Administered 2023-08-13: 10000 [IU] via SUBCUTANEOUS

## 2023-08-14 ENCOUNTER — Other Ambulatory Visit (HOSPITAL_COMMUNITY): Payer: Self-pay

## 2023-08-27 ENCOUNTER — Encounter (HOSPITAL_COMMUNITY)
Admission: RE | Admit: 2023-08-27 | Discharge: 2023-08-27 | Disposition: A | Discharge status: Home / Self Care | Payer: Medicare Other | Source: Ambulatory Visit | Attending: Nephrology | Admitting: Nephrology

## 2023-08-27 VITALS — BP 132/69 | HR 58 | Temp 97.3°F | Resp 18

## 2023-08-27 DIAGNOSIS — N185 Chronic kidney disease, stage 5: Secondary | ICD-10-CM | POA: Diagnosis present

## 2023-08-27 LAB — IRON AND TIBC
Iron: 47 ug/dL (ref 28–170)
Saturation Ratios: 11 % (ref 10.4–31.8)
TIBC: 420 ug/dL (ref 250–450)
UIBC: 373 ug/dL

## 2023-08-27 LAB — FERRITIN: Ferritin: 37 ng/mL (ref 11–307)

## 2023-08-27 LAB — POCT HEMOGLOBIN-HEMACUE: Hemoglobin: 10.7 g/dL — ABNORMAL LOW (ref 12.0–15.0)

## 2023-08-27 MED ORDER — EPOETIN ALFA-EPBX 10000 UNIT/ML IJ SOLN
INTRAMUSCULAR | Status: AC
  Filled 2023-08-27: qty 1

## 2023-08-27 MED ORDER — EPOETIN ALFA-EPBX 10000 UNIT/ML IJ SOLN
10000.0000 [IU] | INTRAMUSCULAR | Status: DC
  Administered 2023-08-27: 10000 [IU] via SUBCUTANEOUS

## 2023-09-10 ENCOUNTER — Encounter (HOSPITAL_COMMUNITY)
Admission: RE | Admit: 2023-09-10 | Discharge: 2023-09-10 | Disposition: A | Discharge status: Home / Self Care | Payer: Medicare Other | Source: Ambulatory Visit | Attending: Nephrology | Admitting: Nephrology

## 2023-09-10 VITALS — BP 160/67 | HR 59 | Temp 98.2°F | Resp 17

## 2023-09-10 DIAGNOSIS — N185 Chronic kidney disease, stage 5: Secondary | ICD-10-CM

## 2023-09-10 LAB — RENAL FUNCTION PANEL
Albumin: 3.9 g/dL (ref 3.5–5.0)
Anion gap: 11 (ref 5–15)
BUN: 75 mg/dL — ABNORMAL HIGH (ref 8–23)
CO2: 26 mmol/L (ref 22–32)
Calcium: 9 mg/dL (ref 8.9–10.3)
Chloride: 106 mmol/L (ref 98–111)
Creatinine, Ser: 5.36 mg/dL — ABNORMAL HIGH (ref 0.44–1.00)
GFR, Estimated: 8 mL/min — ABNORMAL LOW (ref >60)
Glucose, Bld: 104 mg/dL — ABNORMAL HIGH (ref 70–99)
Phosphorus: 4.8 mg/dL — ABNORMAL HIGH (ref 2.5–4.6)
Potassium: 4.1 mmol/L (ref 3.5–5.1)
Sodium: 143 mmol/L (ref 135–145)

## 2023-09-10 LAB — POCT HEMOGLOBIN-HEMACUE: Hemoglobin: 10.5 g/dL — ABNORMAL LOW (ref 12.0–15.0)

## 2023-09-10 MED ORDER — EPOETIN ALFA-EPBX 10000 UNIT/ML IJ SOLN
10000.0000 [IU] | INTRAMUSCULAR | Status: DC
  Administered 2023-09-10: 10000 [IU] via SUBCUTANEOUS

## 2023-09-10 MED ORDER — EPOETIN ALFA-EPBX 10000 UNIT/ML IJ SOLN
INTRAMUSCULAR | Status: AC
  Filled 2023-09-10: qty 1

## 2023-09-23 ENCOUNTER — Other Ambulatory Visit (HOSPITAL_COMMUNITY): Payer: Self-pay | Admitting: *Deleted

## 2023-09-24 ENCOUNTER — Encounter (HOSPITAL_COMMUNITY)
Admission: RE | Admit: 2023-09-24 | Discharge: 2023-09-24 | Disposition: A | Discharge status: Home / Self Care | Payer: Medicare Other | Source: Ambulatory Visit | Attending: Nephrology | Admitting: Nephrology

## 2023-09-24 VITALS — BP 141/61 | HR 56 | Temp 97.4°F | Resp 17

## 2023-09-24 DIAGNOSIS — Z79890 Hormone replacement therapy (postmenopausal): Secondary | ICD-10-CM | POA: Insufficient documentation

## 2023-09-24 DIAGNOSIS — N185 Chronic kidney disease, stage 5: Secondary | ICD-10-CM | POA: Insufficient documentation

## 2023-09-24 DIAGNOSIS — D631 Anemia in chronic kidney disease: Secondary | ICD-10-CM | POA: Insufficient documentation

## 2023-09-24 LAB — POCT HEMOGLOBIN-HEMACUE: Hemoglobin: 10.4 g/dL — ABNORMAL LOW (ref 12.0–15.0)

## 2023-09-24 MED ORDER — EPOETIN ALFA-EPBX 10000 UNIT/ML IJ SOLN
INTRAMUSCULAR | Status: AC
  Filled 2023-09-24: qty 1

## 2023-09-24 MED ORDER — EPOETIN ALFA-EPBX 10000 UNIT/ML IJ SOLN
10000.0000 [IU] | INTRAMUSCULAR | Status: DC
  Administered 2023-09-24: 10000 [IU] via SUBCUTANEOUS

## 2023-10-08 ENCOUNTER — Encounter (HOSPITAL_COMMUNITY)
Admission: RE | Admit: 2023-10-08 | Discharge: 2023-10-08 | Disposition: A | Discharge status: Home / Self Care | Payer: Medicare Other | Source: Ambulatory Visit | Attending: Nephrology | Admitting: Nephrology

## 2023-10-08 VITALS — BP 149/67 | HR 83 | Temp 98.1°F | Resp 16

## 2023-10-08 DIAGNOSIS — N185 Chronic kidney disease, stage 5: Secondary | ICD-10-CM | POA: Diagnosis not present

## 2023-10-08 LAB — RENAL FUNCTION PANEL
Albumin: 3.7 g/dL (ref 3.5–5.0)
Anion gap: 12 (ref 5–15)
BUN: 92 mg/dL — ABNORMAL HIGH (ref 8–23)
CO2: 24 mmol/L (ref 22–32)
Calcium: 8.2 mg/dL — ABNORMAL LOW (ref 8.9–10.3)
Chloride: 107 mmol/L (ref 98–111)
Creatinine, Ser: 5.84 mg/dL — ABNORMAL HIGH (ref 0.44–1.00)
GFR, Estimated: 7 mL/min — ABNORMAL LOW (ref >60)
Glucose, Bld: 111 mg/dL — ABNORMAL HIGH (ref 70–99)
Phosphorus: 5.3 mg/dL — ABNORMAL HIGH (ref 2.5–4.6)
Potassium: 4.6 mmol/L (ref 3.5–5.1)
Sodium: 143 mmol/L (ref 135–145)

## 2023-10-08 LAB — HEMOGLOBIN AND HEMATOCRIT, BLOOD
HCT: 33.1 % — ABNORMAL LOW (ref 36.0–46.0)
Hemoglobin: 10.3 g/dL — ABNORMAL LOW (ref 12.0–15.0)

## 2023-10-08 MED ORDER — EPOETIN ALFA-EPBX 10000 UNIT/ML IJ SOLN
INTRAMUSCULAR | Status: AC
  Administered 2023-10-08: 10000 [IU]
  Filled 2023-10-08: qty 1

## 2023-10-08 MED ORDER — EPOETIN ALFA-EPBX 10000 UNIT/ML IJ SOLN: 10000.0000 [IU] | INTRAMUSCULAR | Status: DC

## 2023-10-08 MED ORDER — SODIUM CHLORIDE 0.9 % IV SOLN
510.0000 mg | INTRAVENOUS | Status: DC
  Administered 2023-10-08: 510 mg via INTRAVENOUS
  Filled 2023-10-08: qty 510

## 2023-10-09 LAB — POCT HEMOGLOBIN-HEMACUE: Hemoglobin: 10.8 g/dL — ABNORMAL LOW (ref 12.0–15.0)

## 2023-10-10 LAB — PTH, INTACT AND CALCIUM
Calcium, Total (PTH): 8.3 mg/dL — ABNORMAL LOW (ref 8.7–10.3)
PTH: 78 pg/mL — ABNORMAL HIGH (ref 15–65)

## 2023-10-22 ENCOUNTER — Encounter (HOSPITAL_COMMUNITY)
Admission: RE | Admit: 2023-10-22 | Discharge: 2023-10-22 | Disposition: A | Discharge status: Home / Self Care | Payer: Medicare Other | Source: Ambulatory Visit | Attending: Nephrology | Admitting: Nephrology

## 2023-10-22 VITALS — BP 147/65 | HR 103 | Temp 97.7°F | Resp 16 | Wt 234.0 lb

## 2023-10-22 DIAGNOSIS — N185 Chronic kidney disease, stage 5: Secondary | ICD-10-CM | POA: Diagnosis not present

## 2023-10-22 LAB — POCT HEMOGLOBIN-HEMACUE: Hemoglobin: 10.4 g/dL — ABNORMAL LOW (ref 12.0–15.0)

## 2023-10-22 MED ORDER — EPOETIN ALFA-EPBX 10000 UNIT/ML IJ SOLN
INTRAMUSCULAR | Status: AC
  Administered 2023-10-22: 10000 [IU] via SUBCUTANEOUS
  Filled 2023-10-22: qty 1

## 2023-10-22 MED ORDER — SODIUM CHLORIDE 0.9 % IV SOLN
510.0000 mg | INTRAVENOUS | Status: AC
  Administered 2023-10-22: 510 mg via INTRAVENOUS
  Filled 2023-10-22: qty 510

## 2023-10-22 MED ORDER — EPOETIN ALFA-EPBX 10000 UNIT/ML IJ SOLN: 10000.0000 [IU] | INTRAMUSCULAR | Status: DC

## 2023-11-05 ENCOUNTER — Encounter (HOSPITAL_COMMUNITY): Payer: Medicare Other

## 2023-11-05 ENCOUNTER — Ambulatory Visit (HOSPITAL_COMMUNITY)
Admission: RE | Admit: 2023-11-05 | Discharge: 2023-11-05 | Disposition: A | Discharge status: Home / Self Care | Payer: Medicare Other | Source: Ambulatory Visit | Attending: Nephrology | Admitting: Nephrology

## 2023-11-05 VITALS — BP 150/61 | HR 58 | Temp 97.4°F | Resp 16

## 2023-11-05 DIAGNOSIS — N185 Chronic kidney disease, stage 5: Secondary | ICD-10-CM

## 2023-11-05 LAB — RENAL FUNCTION PANEL
Albumin: 3.9 g/dL (ref 3.5–5.0)
Anion gap: 12 (ref 5–15)
BUN: 94 mg/dL — ABNORMAL HIGH (ref 8–23)
CO2: 23 mmol/L (ref 22–32)
Calcium: 8.9 mg/dL (ref 8.9–10.3)
Chloride: 108 mmol/L (ref 98–111)
Creatinine, Ser: 5.89 mg/dL — ABNORMAL HIGH (ref 0.44–1.00)
GFR, Estimated: 7 mL/min — ABNORMAL LOW (ref >60)
Glucose, Bld: 84 mg/dL (ref 70–99)
Phosphorus: 5.9 mg/dL — ABNORMAL HIGH (ref 2.5–4.6)
Potassium: 4.3 mmol/L (ref 3.5–5.1)
Sodium: 143 mmol/L (ref 135–145)

## 2023-11-05 LAB — POCT HEMOGLOBIN-HEMACUE: Hemoglobin: 10.6 g/dL — ABNORMAL LOW (ref 12.0–15.0)

## 2023-11-05 MED ORDER — EPOETIN ALFA-EPBX 10000 UNIT/ML IJ SOLN: 10000.0000 [IU] | INTRAMUSCULAR | Status: DC

## 2023-11-05 MED ORDER — EPOETIN ALFA-EPBX 10000 UNIT/ML IJ SOLN
INTRAMUSCULAR | Status: AC
  Administered 2023-11-05: 10000 [IU] via SUBCUTANEOUS
  Filled 2023-11-05: qty 1

## 2023-11-18 ENCOUNTER — Ambulatory Visit (HOSPITAL_COMMUNITY)
Admission: RE | Admit: 2023-11-18 | Discharge: 2023-11-18 | Disposition: A | Discharge status: Home / Self Care | Payer: Medicare Other | Source: Ambulatory Visit | Attending: Nephrology | Admitting: Nephrology

## 2023-11-18 VITALS — BP 151/62 | HR 58 | Temp 97.5°F | Resp 16

## 2023-11-18 DIAGNOSIS — N185 Chronic kidney disease, stage 5: Secondary | ICD-10-CM | POA: Insufficient documentation

## 2023-11-18 LAB — POCT HEMOGLOBIN-HEMACUE: Hemoglobin: 11.4 g/dL — ABNORMAL LOW (ref 12.0–15.0)

## 2023-11-18 LAB — COMPREHENSIVE METABOLIC PANEL
ALT: 21 U/L (ref 0–44)
AST: 18 U/L (ref 15–41)
Albumin: 4 g/dL (ref 3.5–5.0)
Alkaline Phosphatase: 69 U/L (ref 38–126)
Anion gap: 10 (ref 5–15)
BUN: 89 mg/dL — ABNORMAL HIGH (ref 8–23)
CO2: 25 mmol/L (ref 22–32)
Calcium: 8.9 mg/dL (ref 8.9–10.3)
Chloride: 106 mmol/L (ref 98–111)
Creatinine, Ser: 5.51 mg/dL — ABNORMAL HIGH (ref 0.44–1.00)
GFR, Estimated: 8 mL/min — ABNORMAL LOW (ref >60)
Glucose, Bld: 122 mg/dL — ABNORMAL HIGH (ref 70–99)
Potassium: 4.2 mmol/L (ref 3.5–5.1)
Sodium: 141 mmol/L (ref 135–145)
Total Bilirubin: 0.7 mg/dL (ref <1.2)
Total Protein: 6.8 g/dL (ref 6.5–8.1)

## 2023-11-18 LAB — URIC ACID: Uric Acid, Serum: 7.1 mg/dL (ref 2.5–7.1)

## 2023-11-18 MED ORDER — EPOETIN ALFA-EPBX 10000 UNIT/ML IJ SOLN
INTRAMUSCULAR | Status: AC
  Filled 2023-11-18: qty 1

## 2023-11-18 MED ORDER — EPOETIN ALFA-EPBX 10000 UNIT/ML IJ SOLN
10000.0000 [IU] | INTRAMUSCULAR | Status: DC
  Administered 2023-11-18: 10000 [IU] via SUBCUTANEOUS

## 2023-12-02 ENCOUNTER — Encounter (HOSPITAL_COMMUNITY): Payer: Medicare Other

## 2023-12-03 ENCOUNTER — Ambulatory Visit (HOSPITAL_COMMUNITY)
Admission: RE | Admit: 2023-12-03 | Discharge: 2023-12-03 | Disposition: A | Discharge status: Home / Self Care | Payer: Medicare Other | Source: Ambulatory Visit | Attending: Nephrology | Admitting: Nephrology

## 2023-12-03 VITALS — BP 145/63 | HR 59 | Temp 97.4°F | Resp 16

## 2023-12-03 DIAGNOSIS — D631 Anemia in chronic kidney disease: Secondary | ICD-10-CM | POA: Insufficient documentation

## 2023-12-03 DIAGNOSIS — N185 Chronic kidney disease, stage 5: Secondary | ICD-10-CM | POA: Diagnosis present

## 2023-12-03 LAB — RENAL FUNCTION PANEL
Albumin: 3.8 g/dL (ref 3.5–5.0)
Anion gap: 13 (ref 5–15)
BUN: 115 mg/dL — ABNORMAL HIGH (ref 8–23)
CO2: 25 mmol/L (ref 22–32)
Calcium: 8.6 mg/dL — ABNORMAL LOW (ref 8.9–10.3)
Chloride: 104 mmol/L (ref 98–111)
Creatinine, Ser: 6.68 mg/dL — ABNORMAL HIGH (ref 0.44–1.00)
GFR, Estimated: 6 mL/min — ABNORMAL LOW (ref >60)
Glucose, Bld: 122 mg/dL — ABNORMAL HIGH (ref 70–99)
Phosphorus: 5.4 mg/dL — ABNORMAL HIGH (ref 2.5–4.6)
Potassium: 4.6 mmol/L (ref 3.5–5.1)
Sodium: 142 mmol/L (ref 135–145)

## 2023-12-03 LAB — POCT HEMOGLOBIN-HEMACUE: Hemoglobin: 11.5 g/dL — ABNORMAL LOW (ref 12.0–15.0)

## 2023-12-03 MED ORDER — EPOETIN ALFA-EPBX 10000 UNIT/ML IJ SOLN
INTRAMUSCULAR | Status: AC
  Administered 2023-12-03: 10000 [IU] via SUBCUTANEOUS
  Filled 2023-12-03: qty 1

## 2023-12-03 MED ORDER — EPOETIN ALFA-EPBX 10000 UNIT/ML IJ SOLN: 10000.0000 [IU] | INTRAMUSCULAR | Status: DC

## 2023-12-17 ENCOUNTER — Ambulatory Visit (HOSPITAL_COMMUNITY)
Admission: RE | Admit: 2023-12-17 | Discharge: 2023-12-17 | Disposition: A | Discharge status: Home / Self Care | Payer: Medicare Other | Source: Ambulatory Visit | Attending: Nephrology | Admitting: Nephrology

## 2023-12-17 VITALS — BP 144/65 | HR 63 | Temp 97.4°F | Resp 17

## 2023-12-17 DIAGNOSIS — N185 Chronic kidney disease, stage 5: Secondary | ICD-10-CM | POA: Diagnosis not present

## 2023-12-17 LAB — POCT HEMOGLOBIN-HEMACUE: Hemoglobin: 11.9 g/dL — ABNORMAL LOW (ref 12.0–15.0)

## 2023-12-17 MED ORDER — EPOETIN ALFA-EPBX 10000 UNIT/ML IJ SOLN
10000.0000 [IU] | INTRAMUSCULAR | Status: DC
  Administered 2023-12-17: 10000 [IU] via SUBCUTANEOUS

## 2023-12-17 MED ORDER — EPOETIN ALFA-EPBX 10000 UNIT/ML IJ SOLN
INTRAMUSCULAR | Status: AC
  Filled 2023-12-17: qty 1

## 2023-12-31 ENCOUNTER — Ambulatory Visit (HOSPITAL_COMMUNITY)
Admission: RE | Admit: 2023-12-31 | Discharge: 2023-12-31 | Disposition: A | Discharge status: Home / Self Care | Payer: Medicare Other | Source: Ambulatory Visit | Attending: Nephrology | Admitting: Nephrology

## 2023-12-31 VITALS — BP 149/65 | HR 66 | Temp 97.2°F | Resp 17

## 2023-12-31 DIAGNOSIS — N185 Chronic kidney disease, stage 5: Secondary | ICD-10-CM | POA: Diagnosis present

## 2023-12-31 LAB — RENAL FUNCTION PANEL
Albumin: 3.5 g/dL (ref 3.5–5.0)
Anion gap: 12 (ref 5–15)
BUN: 98 mg/dL — ABNORMAL HIGH (ref 8–23)
CO2: 24 mmol/L (ref 22–32)
Calcium: 8.7 mg/dL — ABNORMAL LOW (ref 8.9–10.3)
Chloride: 102 mmol/L (ref 98–111)
Creatinine, Ser: 5.61 mg/dL — ABNORMAL HIGH (ref 0.44–1.00)
GFR, Estimated: 8 mL/min — ABNORMAL LOW (ref >60)
Glucose, Bld: 131 mg/dL — ABNORMAL HIGH (ref 70–99)
Phosphorus: 5.6 mg/dL — ABNORMAL HIGH (ref 2.5–4.6)
Potassium: 4.4 mmol/L (ref 3.5–5.1)
Sodium: 138 mmol/L (ref 135–145)

## 2023-12-31 LAB — POCT HEMOGLOBIN-HEMACUE: Hemoglobin: 11.6 g/dL — ABNORMAL LOW (ref 12.0–15.0)

## 2023-12-31 MED ORDER — EPOETIN ALFA-EPBX 10000 UNIT/ML IJ SOLN
10000.0000 [IU] | INTRAMUSCULAR | Status: DC
  Administered 2023-12-31: 10000 [IU] via SUBCUTANEOUS

## 2023-12-31 MED ORDER — EPOETIN ALFA-EPBX 10000 UNIT/ML IJ SOLN
INTRAMUSCULAR | Status: AC
  Filled 2023-12-31: qty 1

## 2024-01-14 ENCOUNTER — Encounter (HOSPITAL_COMMUNITY)
Admission: RE | Admit: 2024-01-14 | Discharge: 2024-01-14 | Disposition: A | Discharge status: Home / Self Care | Payer: Medicare Other | Source: Ambulatory Visit | Attending: Nephrology | Admitting: Nephrology

## 2024-01-14 VITALS — BP 146/67 | HR 64 | Temp 97.3°F | Resp 17

## 2024-01-14 DIAGNOSIS — N185 Chronic kidney disease, stage 5: Secondary | ICD-10-CM | POA: Diagnosis present

## 2024-01-14 LAB — POCT HEMOGLOBIN-HEMACUE: Hemoglobin: 11.8 g/dL — ABNORMAL LOW (ref 12.0–15.0)

## 2024-01-14 LAB — FERRITIN: Ferritin: 113 ng/mL (ref 11–307)

## 2024-01-14 LAB — IRON AND TIBC
Iron: 79 ug/dL (ref 28–170)
Saturation Ratios: 20 % (ref 10.4–31.8)
TIBC: 388 ug/dL (ref 250–450)
UIBC: 309 ug/dL

## 2024-01-14 MED ORDER — EPOETIN ALFA-EPBX 10000 UNIT/ML IJ SOLN
INTRAMUSCULAR | Status: AC
  Filled 2024-01-14: qty 1

## 2024-01-14 MED ORDER — EPOETIN ALFA-EPBX 10000 UNIT/ML IJ SOLN
10000.0000 [IU] | INTRAMUSCULAR | Status: DC
  Administered 2024-01-14: 10000 [IU] via SUBCUTANEOUS

## 2024-01-15 LAB — PTH, INTACT AND CALCIUM
Calcium, Total (PTH): 8.8 mg/dL (ref 8.7–10.3)
PTH: 88 pg/mL — ABNORMAL HIGH (ref 15–65)

## 2024-01-28 ENCOUNTER — Ambulatory Visit (HOSPITAL_COMMUNITY)
Admission: RE | Admit: 2024-01-28 | Discharge: 2024-01-28 | Disposition: A | Discharge status: Home / Self Care | Payer: Medicare Other | Source: Ambulatory Visit | Attending: Nephrology | Admitting: Nephrology

## 2024-01-28 VITALS — BP 120/56 | HR 61 | Temp 97.3°F | Resp 18

## 2024-01-28 DIAGNOSIS — N185 Chronic kidney disease, stage 5: Secondary | ICD-10-CM | POA: Diagnosis present

## 2024-01-28 LAB — RENAL FUNCTION PANEL
Albumin: 3.6 g/dL (ref 3.5–5.0)
Anion gap: 16 — ABNORMAL HIGH (ref 5–15)
BUN: 98 mg/dL — ABNORMAL HIGH (ref 8–23)
CO2: 26 mmol/L (ref 22–32)
Calcium: 9 mg/dL (ref 8.9–10.3)
Chloride: 102 mmol/L (ref 98–111)
Creatinine, Ser: 5.91 mg/dL — ABNORMAL HIGH (ref 0.44–1.00)
GFR, Estimated: 7 mL/min — ABNORMAL LOW (ref >60)
Glucose, Bld: 105 mg/dL — ABNORMAL HIGH (ref 70–99)
Phosphorus: 6.3 mg/dL — ABNORMAL HIGH (ref 2.5–4.6)
Potassium: 4.4 mmol/L (ref 3.5–5.1)
Sodium: 144 mmol/L (ref 135–145)

## 2024-01-28 LAB — POCT HEMOGLOBIN-HEMACUE: Hemoglobin: 11.2 g/dL — ABNORMAL LOW (ref 12.0–15.0)

## 2024-01-28 MED ORDER — EPOETIN ALFA-EPBX 10000 UNIT/ML IJ SOLN
INTRAMUSCULAR | Status: AC
  Filled 2024-01-28: qty 1

## 2024-01-28 MED ORDER — EPOETIN ALFA-EPBX 10000 UNIT/ML IJ SOLN
10000.0000 [IU] | INTRAMUSCULAR | Status: DC
  Administered 2024-01-28: 10000 [IU] via SUBCUTANEOUS

## 2024-02-11 ENCOUNTER — Encounter (HOSPITAL_COMMUNITY): Payer: Medicare Other

## 2024-02-12 ENCOUNTER — Other Ambulatory Visit: Payer: Self-pay

## 2024-02-12 DIAGNOSIS — N186 End stage renal disease: Secondary | ICD-10-CM

## 2024-02-17 ENCOUNTER — Ambulatory Visit (HOSPITAL_COMMUNITY)
Admission: RE | Admit: 2024-02-17 | Discharge: 2024-02-17 | Disposition: A | Discharge status: Home / Self Care | Payer: Medicare Other | Source: Ambulatory Visit | Attending: Nephrology | Admitting: Nephrology

## 2024-02-17 VITALS — BP 133/65 | HR 52 | Temp 97.6°F | Resp 17

## 2024-02-17 DIAGNOSIS — N185 Chronic kidney disease, stage 5: Secondary | ICD-10-CM | POA: Insufficient documentation

## 2024-02-17 LAB — IRON AND TIBC
Iron: 77 ug/dL (ref 28–170)
Saturation Ratios: 19 % (ref 10.4–31.8)
TIBC: 396 ug/dL (ref 250–450)
UIBC: 319 ug/dL

## 2024-02-17 LAB — FERRITIN: Ferritin: 111 ng/mL (ref 11–307)

## 2024-02-17 LAB — POCT HEMOGLOBIN-HEMACUE: Hemoglobin: 11.3 g/dL — ABNORMAL LOW (ref 12.0–15.0)

## 2024-02-17 MED ORDER — EPOETIN ALFA-EPBX 10000 UNIT/ML IJ SOLN
10000.0000 [IU] | INTRAMUSCULAR | Status: DC
  Administered 2024-02-17: 10000 [IU] via SUBCUTANEOUS

## 2024-02-17 MED ORDER — EPOETIN ALFA-EPBX 10000 UNIT/ML IJ SOLN
INTRAMUSCULAR | Status: AC
  Filled 2024-02-17: qty 1

## 2024-02-18 ENCOUNTER — Encounter (HOSPITAL_COMMUNITY): Payer: Medicare Other

## 2024-02-22 ENCOUNTER — Emergency Department (HOSPITAL_COMMUNITY)

## 2024-02-22 ENCOUNTER — Encounter (HOSPITAL_COMMUNITY): Payer: Self-pay

## 2024-02-22 ENCOUNTER — Other Ambulatory Visit: Payer: Self-pay

## 2024-02-22 ENCOUNTER — Ambulatory Visit: Payer: Medicare Other | Admitting: Surgery

## 2024-02-22 ENCOUNTER — Ambulatory Visit (HOSPITAL_COMMUNITY): Payer: Medicare Other

## 2024-02-22 ENCOUNTER — Emergency Department (HOSPITAL_COMMUNITY)
Admission: EM | Admit: 2024-02-22 | Discharge: 2024-02-23 | Disposition: A | Discharge status: Home / Self Care | Attending: Emergency Medicine | Admitting: Emergency Medicine

## 2024-02-22 DIAGNOSIS — N186 End stage renal disease: Secondary | ICD-10-CM | POA: Diagnosis not present

## 2024-02-22 DIAGNOSIS — R0602 Shortness of breath: Secondary | ICD-10-CM | POA: Insufficient documentation

## 2024-02-22 DIAGNOSIS — S76319A Strain of muscle, fascia and tendon of the posterior muscle group at thigh level, unspecified thigh, initial encounter: Secondary | ICD-10-CM

## 2024-02-22 DIAGNOSIS — Z7901 Long term (current) use of anticoagulants: Secondary | ICD-10-CM | POA: Diagnosis not present

## 2024-02-22 DIAGNOSIS — M25561 Pain in right knee: Secondary | ICD-10-CM | POA: Diagnosis not present

## 2024-02-22 DIAGNOSIS — I120 Hypertensive chronic kidney disease with stage 5 chronic kidney disease or end stage renal disease: Secondary | ICD-10-CM | POA: Diagnosis not present

## 2024-02-22 DIAGNOSIS — M79605 Pain in left leg: Secondary | ICD-10-CM | POA: Diagnosis not present

## 2024-02-22 DIAGNOSIS — M25562 Pain in left knee: Secondary | ICD-10-CM | POA: Diagnosis not present

## 2024-02-22 DIAGNOSIS — Z79899 Other long term (current) drug therapy: Secondary | ICD-10-CM | POA: Diagnosis not present

## 2024-02-22 DIAGNOSIS — M25552 Pain in left hip: Secondary | ICD-10-CM | POA: Insufficient documentation

## 2024-02-22 DIAGNOSIS — J45909 Unspecified asthma, uncomplicated: Secondary | ICD-10-CM | POA: Diagnosis not present

## 2024-02-22 DIAGNOSIS — E1122 Type 2 diabetes mellitus with diabetic chronic kidney disease: Secondary | ICD-10-CM | POA: Insufficient documentation

## 2024-02-22 LAB — CBC WITH DIFFERENTIAL/PLATELET
Abs Immature Granulocytes: 0.02 10*3/uL (ref 0.00–0.07)
Basophils Absolute: 0.1 10*3/uL (ref 0.0–0.1)
Basophils Relative: 1 %
Eosinophils Absolute: 0.3 10*3/uL (ref 0.0–0.5)
Eosinophils Relative: 6 %
HCT: 35.9 % — ABNORMAL LOW (ref 36.0–46.0)
Hemoglobin: 11 g/dL — ABNORMAL LOW (ref 12.0–15.0)
Immature Granulocytes: 0 %
Lymphocytes Relative: 20 %
Lymphs Abs: 1.1 10*3/uL (ref 0.7–4.0)
MCH: 28.4 pg (ref 26.0–34.0)
MCHC: 30.6 g/dL (ref 30.0–36.0)
MCV: 92.5 fL (ref 80.0–100.0)
Monocytes Absolute: 0.5 10*3/uL (ref 0.1–1.0)
Monocytes Relative: 9 %
Neutro Abs: 3.6 10*3/uL (ref 1.7–7.7)
Neutrophils Relative %: 64 %
Platelets: 174 10*3/uL (ref 150–400)
RBC: 3.88 MIL/uL (ref 3.87–5.11)
RDW: 15.2 % (ref 11.5–15.5)
WBC: 5.6 10*3/uL (ref 4.0–10.5)
nRBC: 0 % (ref 0.0–0.2)

## 2024-02-22 LAB — BASIC METABOLIC PANEL
Anion gap: 14 (ref 5–15)
BUN: 107 mg/dL — ABNORMAL HIGH (ref 8–23)
CO2: 24 mmol/L (ref 22–32)
Calcium: 8.5 mg/dL — ABNORMAL LOW (ref 8.9–10.3)
Chloride: 104 mmol/L (ref 98–111)
Creatinine, Ser: 6.13 mg/dL — ABNORMAL HIGH (ref 0.44–1.00)
GFR, Estimated: 7 mL/min — ABNORMAL LOW (ref >60)
Glucose, Bld: 118 mg/dL — ABNORMAL HIGH (ref 70–99)
Potassium: 4.3 mmol/L (ref 3.5–5.1)
Sodium: 142 mmol/L (ref 135–145)

## 2024-02-22 LAB — BRAIN NATRIURETIC PEPTIDE: B Natriuretic Peptide: 55.4 pg/mL (ref 0.0–100.0)

## 2024-02-22 MED ORDER — HYDROCODONE-ACETAMINOPHEN 5-325 MG PO TABS
1.0000 | ORAL_TABLET | Freq: Four times a day (QID) | ORAL | Status: DC | PRN
  Administered 2024-02-23: 1 via ORAL
  Filled 2024-02-22: qty 1

## 2024-02-22 MED ORDER — LORAZEPAM 0.5 MG PO TABS: 0.5000 mg | ORAL_TABLET | Freq: Once | ORAL | Status: DC

## 2024-02-22 MED ORDER — PREDNISONE 20 MG PO TABS
60.0000 mg | ORAL_TABLET | Freq: Once | ORAL | Status: AC
  Administered 2024-02-22: 60 mg via ORAL
  Filled 2024-02-22: qty 3

## 2024-02-22 MED ORDER — ACETAMINOPHEN 325 MG PO TABS: 650.0000 mg | ORAL_TABLET | Freq: Four times a day (QID) | ORAL | Status: DC | PRN

## 2024-02-22 MED ORDER — HYDROCODONE-ACETAMINOPHEN 5-325 MG PO TABS
1.0000 | ORAL_TABLET | Freq: Once | ORAL | Status: AC
  Administered 2024-02-22: 1 via ORAL
  Filled 2024-02-22: qty 1

## 2024-02-22 NOTE — ED Triage Notes (Signed)
 Pt denies any trauma to left hip  Ems vitals 170 palp  Hr 77 Rr 20  98% on ra

## 2024-02-22 NOTE — ED Provider Notes (Signed)
  Physical Exam  BP (!) 181/73 (BP Location: Right Arm)   Pulse 82   Temp 98.6 F (37 C) (Oral)   Resp 20   Ht 5\' 3"  (1.6 m)   Wt 106 kg   LMP 12/23/2007 (Approximate)   SpO2 100%   BMI 41.40 kg/m   Physical Exam  Procedures  Procedures  ED Course / MDM   Clinical Course as of 02/22/24 2037  Mon Feb 22, 2024  1120  IMPRESSION: 1. Multilevel degenerative changes of the lumbar spine as described above. Moderate to severe spinal canal stenosis at L3-L4. 2. 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 3. Marrow heterogeneity is present. Although this can be caused by marrow infiltrative processes, the most common causes include anemia, smoking, obesity, or advancing age.    [DR]  1121  IMPRESSION: 1. Multilevel degenerative changes of the lumbar spine as described above. Moderate to severe spinal canal stenosis at L3-L4. 2. 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 3. Marrow heterogeneity is present. Although this can be caused by marrow infiltrative processes, the most common causes include anemia, smoking, obesity, or advancing age.    [DR]    Clinical Course User Index [DR] Margarita Grizzle, MD   Medical Decision Making Care assumed at 3 PM.  Patient is here with back pain and left hip pain.  MRI showed that she tore L hamstring.  Signout pending physical therapy evaluation and possible placement  8:40 PM Patient was seen by physical therapy and recommended rehab.  Case management will work on rehab facility for her. Will start on vicodin prn pain   Amount and/or Complexity of Data Reviewed Labs: ordered. Radiology: ordered.  Risk OTC drugs. Prescription drug management.          Colleen Pander, MD 02/22/24 [phone]

## 2024-02-22 NOTE — ED Triage Notes (Signed)
 Pt coming in from home with pain in her hip left hip shooting down her left leg. Pt reports this is new 7/10 pain. Pt has stage 5 renal failure. 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

## 2024-02-22 NOTE — ED Notes (Signed)
 Patient transported to MRI

## 2024-02-22 NOTE — Evaluation (Signed)
 Physical Therapy Evaluation Patient Details Name: Colleen Gray MRN: [id number] DOB: 1951/02/28 Today's Date: 02/22/2024  History of Present Illness  Pt roomed in ED 3/3 due to left hip and leg pain.  Denies any fall or injury.  Pain started while she was lying in bed.  She has been having to go to the bathroom many times a day as her Lasix dose was recently increased.  States the pain is severe and she is not able to walk or ambulate.  Pain starts at her left buttock and radiates down to her left side stopping at mid thigh does not go beyond her knee. ONG:EXBMWU, ESRD not on dialysis, hypertension, diabetes  Clinical Impression  Pt admitted with above diagnosis. Pt was able to come to sitting on edge of stretcher however could not stand due to pain and stretcher too far from ground and pt couldn't scoot her feet to edge. PErformed exercises and pt is able to complete 10 reps each LE with incr pain in left LE over right LE.  Pt limited by weakness and pain bil LEs.  Recommend post acute rehab < 3 hours day prior to going back home. Will follow acutely.   Pt currently with functional limitations due to the deficits listed below (see PT Problem List). Pt will benefit from acute skilled PT to increase their independence and safety with mobility to allow discharge.           If plan is discharge home, recommend the following: A lot of help with walking and/or transfers;A lot of help with bathing/dressing/bathroom;Assistance with cooking/housework;Help with stairs or ramp for entrance;Assist for transportation   Can travel by private vehicle   No    Equipment Recommendations Other (comment) (pt would benefit from scooter)  Recommendations for Other Services       Functional Status Assessment Patient has had a recent decline in their functional status and demonstrates the ability to make significant improvements in function in a reasonable and predictable amount of time.     Precautions /  Restrictions Precautions Precautions: Fall Recall of Precautions/Restrictions: Intact Restrictions Weight Bearing Restrictions Per Provider Order: No      Mobility  Bed Mobility Overal bed mobility: Needs Assistance Bed Mobility: Supine to Sit     Supine to sit: Mod assist     General bed mobility comments: Needed assist for LEs due to pain as well as need for assist for elevation of trunk with pt pulling up on PT hand.  Incr difficulty due to pt on stretcher    Transfers                   General transfer comment: Unsafe to stand as pt was elevated on stretcher and it wouldnt move lower.    Ambulation/Gait                  Stairs            Wheelchair Mobility     Tilt Bed    Modified Rankin (Stroke Patients Only)       Balance                                             Pertinent Vitals/Pain Pain Assessment Pain Assessment: Faces Faces Pain Scale: Hurts whole lot Pain Location: bil Knees and left buttocks Pain Descriptors / Indicators: Aching, Discomfort,  Grimacing, Guarding Pain Intervention(s): Limited activity within patient's tolerance, Monitored during session, Patient requesting pain meds-RN notified    Home Living Family/patient expects to be discharged to:: Private residence Living Arrangements: Alone Available Help at Discharge: Family;Available PRN/intermittently Type of Home: House Home Access: Stairs to enter Entrance Stairs-Rails: Right Entrance Stairs-Number of Steps: 3   Home Layout: One level Home Equipment: Tub bench;Cane - quad;Cane - single point;Rollator (4 wheels);BSC/3in1 Additional Comments: lift chair, height of bed 21"    Prior Function Prior Level of Function : Independent/Modified Independent;Driving             Mobility Comments: uses a quad cane most of the time, strt cane at times and rollator when really fatigued ADLs Comments: Difficult to get on and off tub bench per pt  but she was I with B/D PTA;  could not clean and was looking for a cleaning company, does own cooking.     Extremity/Trunk Assessment   Upper Extremity Assessment Upper Extremity Assessment: Defer to OT evaluation    Lower Extremity Assessment Lower Extremity Assessment: RLE deficits/detail;LLE deficits/detail RLE Deficits / Details: grossly 3/5 RLE: Unable to fully assess due to pain LLE Deficits / Details: grossly 3/5 LLE: Unable to fully assess due to pain    Cervical / Trunk Assessment Cervical / Trunk Assessment: Normal  Communication   Communication Communication: No apparent difficulties    Cognition Arousal: Alert Behavior During Therapy: WFL for tasks assessed/performed   PT - Cognitive impairments: No apparent impairments                         Following commands: Intact       Cueing       General Comments General comments (skin integrity, edema, etc.): VSS    Exercises General Exercises - Lower Extremity Ankle Circles/Pumps: AROM, Both, 5 reps, Seated Long Arc Quad: AROM, Both, 10 reps, Seated Heel Slides: AAROM, Both, 5 reps, Supine   Assessment/Plan    PT Assessment Patient needs continued PT services  PT Problem List Decreased activity tolerance;Decreased balance;Decreased mobility;Decreased knowledge of use of DME;Decreased safety awareness;Decreased strength;Decreased range of motion;Pain;Obesity       PT Treatment Interventions DME instruction;Functional mobility training;Therapeutic activities;Gait training;Therapeutic exercise;Balance training;Patient/family education    PT Goals (Current goals can be found in the Care Plan section)  Acute Rehab PT Goals Patient Stated Goal: to go to Rehab and then home PT Goal Formulation: With patient Time For Goal Achievement: 03/07/24 Potential to Achieve Goals: Fair    Frequency Min 1X/week     Co-evaluation               AM-PAC PT "6 Clicks" Mobility  Outcome Measure Help  needed turning from your back to your side while in a flat bed without using bedrails?: A Little Help needed moving from lying on your back to sitting on the side of a flat bed without using bedrails?: A Lot Help needed moving to and from a bed to a chair (including a wheelchair)?: A Lot Help needed standing up from a chair using your arms (e.g., wheelchair or bedside chair)?: A Lot Help needed to walk in hospital room?: Total Help needed climbing 3-5 steps with a railing? : Total 6 Click Score: 11    End of Session   Activity Tolerance: Patient limited by fatigue;Patient limited by pain Patient left: with family/visitor present (on stretcher in hallway of ED) Nurse Communication: Mobility status PT Visit Diagnosis:  Muscle weakness (generalized) (M62.81);Pain Pain - part of body:  (bil LE, bil knees and left buttock)    Time: 1610-9604 PT Time Calculation (min) (ACUTE ONLY): 30 min   Charges:   PT Evaluation $PT Eval Low Complexity: 1 Low PT Treatments $Therapeutic Activity: 8-22 mins PT General Charges $$ ACUTE PT VISIT: 1 Visit         Colleen Gray M,PT Acute Rehab Services [phone]   Colleen Gray 02/22/2024, 2:34 PM

## 2024-02-22 NOTE — [place] FL2 (Signed)
 [place] MEDICAID FL2 LEVEL OF CARE FORM     IDENTIFICATION  Patient Name: Colleen Gray Birthdate: 1951-07-31 Sex: female Admission Date (Current Location): 02/22/2024  [facility] and [place] Number:  Producer, television/film/video and Address:  [facility], [address] [place], [place] [address]      Provider Number: [id number]  Attending Physician Name and Address:  Margarita Grizzle, MD  Relative Name and Phone Number:       Current Level of Care: Hospital Recommended Level of Care: Skilled Nursing Facility Prior Approval Number:    Date Approved/Denied:   PASRR Number: [id number]  Discharge Plan: SNF    Current Diagnoses: Patient Active Problem List   Diagnosis Date Noted   S/p reverse total shoulder arthroplasty 05/14/2023   Status post reverse total arthroplasty of right shoulder 05/13/2023   Pre-op evaluation 11/06/2022   ESRD (end stage renal disease) (HCC) 09/19/2022   ESRD (end stage renal disease) on dialysis (HCC) 07/09/2022   Polyneuropathy due to type 2 diabetes mellitus (HCC) 11/12/2021   CKD (chronic kidney disease) stage 5, GFR less than 15 ml/min (HCC) 04/23/2021   Abnormal weight gain 04/01/2021   Colon cancer screening 04/01/2021   Family history of malignant neoplasm of gastrointestinal tract 04/01/2021   Iron deficiency anemia 04/01/2021   Stage 4 chronic kidney disease (HCC) 03/01/2020   Pain due to onychomycosis of toenails of both feet 07/05/2019   Diabetes mellitus without complication (HCC) 07/05/2019   Essential hypertension 06/29/2019   Carpal tunnel syndrome 02/06/2016   S/P shoulder replacement 11/22/2015    Orientation RESPIRATION BLADDER Height & Weight     Time, Situation, Self, Place  Normal Continent Weight: 233 lb 11 oz (106 kg) Height:  5\' 3"  (160 cm)  BEHAVIORAL SYMPTOMS/MOOD NEUROLOGICAL BOWEL NUTRITION STATUS      Continent Diet (See AVS)  AMBULATORY STATUS COMMUNICATION OF NEEDS Skin   Limited  Assist   Normal                       Personal Care Assistance Level of Assistance  Bathing, Dressing, Feeding Bathing Assistance: Limited assistance Feeding assistance: Independent Dressing Assistance: Limited assistance     Functional Limitations Info  Hearing, Sight, Speech Sight Info: Adequate Hearing Info: Adequate Speech Info: Adequate    SPECIAL CARE FACTORS FREQUENCY  PT (By licensed PT), OT (By licensed OT)     PT Frequency: 5x weekly OT Frequency: 5x weekly            Contractures Contractures Info: Not present    Additional Factors Info  Code Status, Allergies Code Status Info: Full Code Allergies Info: Nifedipine  Other  Penicillins           Current Medications (02/22/2024):  This is the current hospital active medication list Current Facility-Administered Medications  Medication Dose Route Frequency Provider Last Rate Last Admin   LORazepam (ATIVAN) tablet 0.5 mg  0.5 mg Oral Once Rancour, Stephen, MD       Current Outpatient Medications  Medication Sig Dispense Refill   Alpha-Lipoic Acid 300 MG CAPS Take 300 mg by mouth daily.     amLODipine (NORVASC) 10 MG tablet Take 10 mg by mouth daily.     apixaban (ELIQUIS) 2.5 MG TABS tablet Take 1 tablet (2.5 mg total) by mouth 2 (two) times daily. 60 tablet 0   Blood Glucose Monitoring Suppl (GLUCOCOM BLOOD GLUCOSE MONITOR) DEVI Use daily to check blood sugar.  calcitRIOL (ROCALTROL) 0.5 MCG capsule Take 0.5 mcg by mouth daily.     Calcium Carbonate Antacid (TUMS ULTRA 1000 PO) Take 1,000 mg by mouth 3 (three) times daily.     Cholecalciferol (VITAMIN D3) 250 MCG (10000 UT) capsule Take 10,000 Units by mouth every Tuesday.     ferrous sulfate 325 (65 FE) MG tablet Take 325 mg by mouth daily with breakfast.     folic acid (FOLVITE) 1 MG tablet Take 1 mg by mouth daily.     furosemide (LASIX) 40 MG tablet Take 40 mg by mouth daily.     labetalol (NORMODYNE) 200 MG tablet Take 200 mg by mouth 2 (two)  times daily.     Multiple Vitamin (MULTIVITAMIN WITH MINERALS) TABS tablet Take 1 tablet by mouth daily.     nitroGLYCERIN (NITROSTAT) 0.4 MG SL tablet Place 0.4 mg under the tongue every 5 (five) minutes as needed for chest pain.     Omega 3 1000 MG CAPS Take 1,000 mg by mouth daily.     OVER THE COUNTER MEDICATION Take 1 capsule by mouth daily. Fruit and Vegetable Blend     sevelamer carbonate (RENVELA) 800 MG tablet Take 1,600 mg by mouth 3 (three) times daily with meals.     sodium bicarbonate 650 MG tablet Take 650 mg by mouth 3 (three) times daily.     vitamin B-12 (CYANOCOBALAMIN) 1000 MCG tablet Take 1,000 mcg by mouth every Monday, Wednesday, and Friday.     vitamin C (ASCORBIC ACID) 500 MG tablet Take 500 mg by mouth daily.       Discharge Medications: Please see discharge summary for a list of discharge medications.  Relevant Imaging Results:  Relevant Lab Results:   Additional Information SS# [id number]  Inis Sizer, LCSW

## 2024-02-22 NOTE — ED Provider Notes (Signed)
  Physical Exam  BP (!) 158/62 (BP Location: Right Wrist)   Pulse 72   Temp 98.1 F (36.7 C) (Oral)   Resp 18   Ht 1.6 m (5\' 3" )   Wt 106 kg   LMP 12/23/2007 (Approximate)   SpO2 96%   BMI 41.40 kg/m   Physical Exam  Procedures  Procedures  ED Course / MDM   Clinical Course as of 02/22/24 1137  Mon Feb 22, 2024  1120  IMPRESSION: 1. Multilevel degenerative changes of the lumbar spine as described above. Moderate to severe spinal canal stenosis at L3-L4. 2. 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 3. Marrow heterogeneity is present. Although this can be caused by marrow infiltrative processes, the most common causes include anemia, smoking, obesity, or advancing age.    [DR]  1121  IMPRESSION: 1. Multilevel degenerative changes of the lumbar spine as described above. Moderate to severe spinal canal stenosis at L3-L4. 2. 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 3. Marrow heterogeneity is present. Although this can be caused by marrow infiltrative processes, the most common causes include anemia, smoking, obesity, or advancing age.    [DR]    Clinical Course User Index [DR] Margarita Grizzle, MD   Medical Decision Making Amount and/or Complexity of Data Reviewed Labs: ordered. Radiology: ordered.  Risk Prescription drug management.  Received from Dr. Manus Gunning 72 esrd -stable-with left thigh pain No trauma X-Yeriel Mineo negative MRI pending TOC consult  Discussed results of MRI with patient.  She has been having ongoing pain and difficulty with mobility. Plan to consult Cristy Folks, MD 02/22/24 [phone]

## 2024-02-22 NOTE — ED Notes (Signed)
 Called pt x3, no answer.  KM

## 2024-02-22 NOTE — Progress Notes (Addendum)
 1:05pm: CSW received bed offers from both [facility] and [facility], however PT needs to evaluate patient before patient can discharge to either facility. PT evaluation has been ordered.  12:40pm: CSW completed FL2 and faxed patient's clinical information out to the two facilities that are accepting the Medicare Waiver - [facility] and [facility].  Once bed offers are obtained, TOC will present them to patient for choice.  Edwin Dada, MSW, LCSW Transitions of Care  Clinical Social Worker II [phone]

## 2024-02-22 NOTE — Care Management (Signed)
 Transition of Care [facility]) - Emergency Department Mini Assessment   Patient Details  Name: Colleen Gray MRN: [id number] Date of Birth: 04/30/1951  Transition of Care [facility]) CM/SW Contact:    Lockie Pares, RN Phone Number: 02/22/2024, 8:30 AM   Clinical Narrative:  Spoke to patient at bedside regarding dispo discharge planning. She lives alone nad has one step to get inside she has a walker, cane, shower seat and BSCSHe states her primary doctor is working on a scooter. She has someone to call if she needs anything and they check on her also. She states she does need assistance with house cleaning Discussed using reputable services that are insured. She requests home  health, does not want rehab, She had [facility] in the past and would like them back again. Messaged provider for orders.  Clifton Custard from [facility] accepted for services PT OT and social work  ED Mini Assessment: What brought you to the Emergency Department? : Hip pain  Barriers to Discharge: ED No Barriers  Barrier interventions: Home health     Interventions which prevented an admission or readmission: Home Health Consult or Services    Patient Contact and Communications        ,                 Admission diagnosis:  hip pain Patient Active Problem List   Diagnosis Date Noted   S/p reverse total shoulder arthroplasty 05/14/2023   Status post reverse total arthroplasty of right shoulder 05/13/2023   Pre-op evaluation 11/06/2022   ESRD (end stage renal disease) (HCC) 09/19/2022   ESRD (end stage renal disease) on dialysis (HCC) 07/09/2022   Polyneuropathy due to type 2 diabetes mellitus (HCC) 11/12/2021   CKD (chronic kidney disease) stage 5, GFR less than 15 ml/min (HCC) 04/23/2021   Abnormal weight gain 04/01/2021   Colon cancer screening 04/01/2021   Family history of malignant neoplasm of gastrointestinal tract 04/01/2021   Iron deficiency anemia 04/01/2021   Stage 4 chronic kidney disease  (HCC) 03/01/2020   Pain due to onychomycosis of toenails of both feet 07/05/2019   Diabetes mellitus without complication (HCC) 07/05/2019   Essential hypertension 06/29/2019   Carpal tunnel syndrome 02/06/2016   S/P shoulder replacement 11/22/2015   PCP:  Gwenyth Bender, MD Pharmacy:   [phone] [place], [place] - [address] AT CORNER OF [facility] [address] [place] [address] Phone: [phone] Fax: [phone]  [facility] - [place], [place] - [address] [facility] AT Portal to Registered [address] [place] [address] Phone: [phone] Fax: [phone]  [facility] [address] [address] Phone: [phone] Fax: [phone]

## 2024-02-22 NOTE — ED Provider Notes (Signed)
 [place] EMERGENCY DEPARTMENT AT [facility] Provider Note   CSN: [id number] Arrival date & time: 02/22/24  0315     History  Chief Complaint  Patient presents with   Hip Pain    Colleen Gray is a 73 y.o. female.  Patient with a history of asthma, ESRD not on dialysis, hypertension, diabetes presents with left hip and leg pain.  Denies any fall or injury.  Pain started today while she was lying in bed.  She has been trying to go to the bathroom many times a day as her Lasix dose was recently increased.  States the pain is severe and she is not able to walk or ambulate.  Did not taken anything for it at home.  Pain starts at her left buttock and radiates down to her left side stopping at mid thigh does not go beyond her knee.  Some weakness in the leg.  No numbness or tingling.  No bowel or bladder incontinence.  No fever or vomiting.  No chest pain or shortness of breath.  No history of back or hip problems.  Does have chronic knee pain.  No previous back surgeries.  No history of IV drug abuse or cancer.  The history is provided by the patient.  Hip Pain Pertinent negatives include no abdominal pain and no headaches.       Home Medications Prior to Admission medications   Medication Sig Start Date End Date Taking? Authorizing Provider  Alpha-Lipoic Acid 300 MG CAPS Take 300 mg by mouth daily.    [provider]  amLODipine (NORVASC) 10 MG tablet Take 10 mg by mouth daily. 03/18/19   [provider]  apixaban (ELIQUIS) 2.5 MG TABS tablet Take 1 tablet (2.5 mg total) by mouth 2 (two) times daily. 05/16/23   McBane, Jerald Kief, PA-C  Blood Glucose Monitoring Suppl (GLUCOCOM BLOOD GLUCOSE MONITOR) DEVI Use daily to check blood sugar. 06/29/18   [provider]  calcitRIOL (ROCALTROL) 0.5 MCG capsule Take 0.5 mcg by mouth daily. 06/25/19   [provider]  Calcium Carbonate Antacid (TUMS ULTRA 1000 PO) Take 1,000 mg by mouth 3 (three) times  daily.    [provider]  Cholecalciferol (VITAMIN D3) 250 MCG (10000 UT) capsule Take 10,000 Units by mouth every Tuesday.    [provider]  ferrous sulfate 325 (65 FE) MG tablet Take 325 mg by mouth daily with breakfast.    [provider]  folic acid (FOLVITE) 1 MG tablet Take 1 mg by mouth daily. 06/29/19   [provider]  furosemide (LASIX) 40 MG tablet Take 40 mg by mouth daily. 08/11/22   [provider]  labetalol (NORMODYNE) 200 MG tablet Take 200 mg by mouth 2 (two) times daily. 06/09/19   [provider]  Multiple Vitamin (MULTIVITAMIN WITH MINERALS) TABS tablet Take 1 tablet by mouth daily.    [provider]  nitroGLYCERIN (NITROSTAT) 0.4 MG SL tablet Place 0.4 mg under the tongue every 5 (five) minutes as needed for chest pain. 09/22/17   [provider]  Omega 3 1000 MG CAPS Take 1,000 mg by mouth daily.    [provider]  OVER THE COUNTER MEDICATION Take 1 capsule by mouth daily. Fruit and Vegetable Blend    [provider]  sevelamer carbonate (RENVELA) 800 MG tablet Take 1,600 mg by mouth 3 (three) times daily with meals. 04/21/17   [provider]  sodium bicarbonate 650 MG tablet Take  650 mg by mouth 3 (three) times daily.    [provider]  vitamin B-12 (CYANOCOBALAMIN) 1000 MCG tablet Take 1,000 mcg by mouth every Monday, Wednesday, and Friday.    [provider]  vitamin C (ASCORBIC ACID) 500 MG tablet Take 500 mg by mouth daily.    [provider]      Allergies    Nifedipine, Other, and Penicillins    Review of Systems   Review of Systems  Constitutional:  Negative for activity change, appetite change and fever.  HENT:  Negative for congestion and rhinorrhea.   Respiratory:  Negative for cough and chest tightness.   Gastrointestinal:  Negative for abdominal pain, nausea and vomiting.  Genitourinary:  Negative for dysuria and hematuria.   Musculoskeletal:  Positive for arthralgias and myalgias.  Neurological:  Negative for dizziness, weakness and headaches.   all other systems are negative except as noted in the HPI and PMH.    Physical Exam Updated Vital Signs BP (!) 158/62 (BP Location: Right Wrist)   Pulse 72   Temp 98.1 F (36.7 C) (Oral)   Resp 18   Ht 5\' 3"  (1.6 m)   Wt 106 kg   LMP 12/23/2007 (Approximate)   SpO2 96%   BMI 41.40 kg/m  Physical Exam Vitals and nursing note reviewed.  Constitutional:      General: She is not in acute distress.    Appearance: She is well-developed.  HENT:     Head: Normocephalic and atraumatic.     Mouth/Throat:     Pharynx: No oropharyngeal exudate.  Eyes:     Conjunctiva/sclera: Conjunctivae normal.     Pupils: Pupils are equal, round, and reactive to light.  Neck:     Comments: No meningismus. Cardiovascular:     Rate and Rhythm: Normal rate and regular rhythm.     Heart sounds: Normal heart sounds. No murmur heard. Pulmonary:     Effort: Pulmonary effort is normal. No respiratory distress.     Breath sounds: Normal breath sounds.  Abdominal:     Palpations: Abdomen is soft.     Tenderness: There is no abdominal tenderness. There is no guarding or rebound.  Musculoskeletal:        General: Tenderness present. Normal range of motion.     Cervical back: Normal range of motion and neck supple.     Comments: Pain with range of motion of left hip.  5/5 strength in bilateral lower extremities. Ankle plantar and dorsiflexion intact. Great toe extension intact bilaterally. +2 DP and PT pulses with doppler.  Gait not tested. Reflexes not tested  Skin:    General: Skin is warm.  Neurological:     Mental Status: She is alert and oriented to person, place, and time.     Cranial Nerves: No cranial nerve deficit.     Motor: No abnormal muscle tone.     Coordination: Coordination normal.     Comments:  5/5 strength throughout. CN 2-12 intact.Equal grip strength.    Psychiatric:        Behavior: Behavior normal.     ED Results / Procedures / Treatments   Labs (all labs ordered are listed, but only abnormal results are displayed) Labs Reviewed  CBC WITH DIFFERENTIAL/PLATELET - Abnormal; Notable for the following components:      Result Value   Hemoglobin 11.0 (*)    HCT 35.9 (*)    All other components within normal limits  BASIC METABOLIC PANEL - Abnormal; Notable  for the following components:   Glucose, Bld 118 (*)    BUN 107 (*)    Creatinine, Ser 6.13 (*)    Calcium 8.5 (*)    GFR, Estimated 7 (*)    All other components within normal limits  BRAIN NATRIURETIC PEPTIDE    EKG None  Radiology DG Hip Unilat W or Wo Pelvis 2-3 Views Left Result Date: 02/22/2024 CLINICAL DATA:  Shortness of breath.  Left hip pain. EXAM: DG HIP (WITH OR WITHOUT PELVIS) 2-3V LEFT COMPARISON:  None Available. FINDINGS: Films are under penetrated. Within this limitation, no acute fracture evident. No evidence for hip dislocation. Minimal degenerative changes are noted in the SI joints. Symphysis pubis unremarkable. IMPRESSION: 1. No acute bony findings. 2. Minimal degenerative changes in the SI joints. Electronically Signed   By: Kennith Center M.D.   On: 02/22/2024 05:21   DG Chest 2 View Result Date: 02/22/2024 CLINICAL DATA:  Shortness of breath EXAM: CHEST - 2 VIEW COMPARISON:  03/04/2021 FINDINGS: Cardiomegaly. Diffuse interstitial coarsening. There is no edema, consolidation, effusion, or pneumothorax. Bilateral shoulder arthroplasty. Extensive degenerative spurring in the thoracic spine. IMPRESSION: No acute or interval finding.  Chronic cardiomegaly. Electronically Signed   By: Tiburcio Pea M.D.   On: 02/22/2024 05:20    Procedures Procedures    Medications Ordered in ED Medications  HYDROcodone-acetaminophen (NORCO/VICODIN) 5-325 MG per tablet 1 tablet (has no administration in time range)    ED Course/ Medical Decision Making/ A&P                                  Medical Decision Making Amount and/or Complexity of Data Reviewed Labs: ordered. Decision-making details documented in ED Course. Radiology: ordered and independent interpretation performed. Decision-making details documented in ED Course. ECG/medicine tests: ordered and independent interpretation performed. Decision-making details documented in ED Course.  Risk Prescription drug management.   Atraumatic left hip and leg pain.  Concern for possible sciatica.  Does have distal strength and pulses intact but severe pain.  Toe extension intact bilaterally.  Distal sensation intact and distal pulses intact.  Will check basic labs given her ESRD history as well as chest x-ray and left hip x-ray.  No hypoxia on room air. No increased work of breathing.  Creatinine is at baseline. Potassium within normal limits.  No significant leukocytosis.  X-ray of left hip is negative for fracture or dislocation.  Results reviewed interpreted by me.  Concern for possible sciatica.  MRI will be obtained.  Patient will be given additional pain medications and steroids.  She has severe pain and states unable to walk. She does live alone. TOC consult placed for the AM to determine home health versus rehab needs.   MRI pending at shift change. Concern for possible sciatica or musculoskeletal pathology of L hip.  Dr. Rosalia Hammers to assume care.       Final Clinical Impression(s) / ED Diagnoses Final diagnoses:  None    Rx / DC Orders ED Discharge Orders     None         Hadlei Stitt, Jeannett Senior, MD 02/22/24 [phone]

## 2024-02-23 ENCOUNTER — Emergency Department (HOSPITAL_BASED_OUTPATIENT_CLINIC_OR_DEPARTMENT_OTHER)

## 2024-02-23 DIAGNOSIS — M25552 Pain in left hip: Secondary | ICD-10-CM | POA: Diagnosis not present

## 2024-02-23 DIAGNOSIS — R2232 Localized swelling, mass and lump, left upper limb: Secondary | ICD-10-CM | POA: Diagnosis not present

## 2024-02-23 MED ORDER — FUROSEMIDE 20 MG PO TABS
40.0000 mg | ORAL_TABLET | Freq: Every day | ORAL | Status: DC
  Administered 2024-02-23: 40 mg via ORAL
  Filled 2024-02-23: qty 2

## 2024-02-23 MED ORDER — VITAMIN C 500 MG PO TABS: 500.0000 mg | ORAL_TABLET | Freq: Every day | ORAL | Status: DC

## 2024-02-23 MED ORDER — LABETALOL HCL 200 MG PO TABS
200.0000 mg | ORAL_TABLET | Freq: Two times a day (BID) | ORAL | Status: DC
  Administered 2024-02-23: 200 mg via ORAL
  Filled 2024-02-23: qty 1

## 2024-02-23 MED ORDER — ADULT MULTIVITAMIN W/MINERALS CH
1.0000 | ORAL_TABLET | Freq: Every day | ORAL | Status: DC
  Administered 2024-02-23: 1 via ORAL
  Filled 2024-02-23: qty 1

## 2024-02-23 MED ORDER — OMEGA-3-ACID ETHYL ESTERS 1 G PO CAPS
1000.0000 mg | ORAL_CAPSULE | Freq: Every day | ORAL | Status: DC
  Administered 2024-02-23: 1000 mg via ORAL
  Filled 2024-02-23: qty 1

## 2024-02-23 MED ORDER — VITAMIN B-12 1000 MCG PO TABS: 1000.0000 ug | ORAL_TABLET | ORAL | Status: DC

## 2024-02-23 MED ORDER — AMLODIPINE BESYLATE 5 MG PO TABS
10.0000 mg | ORAL_TABLET | Freq: Every day | ORAL | Status: DC
  Administered 2024-02-23: 10 mg via ORAL
  Filled 2024-02-23: qty 2

## 2024-02-23 MED ORDER — SEVELAMER CARBONATE 800 MG PO TABS: 1600.0000 mg | ORAL_TABLET | Freq: Three times a day (TID) | ORAL | Status: DC

## 2024-02-23 MED ORDER — SODIUM BICARBONATE 650 MG PO TABS: 650.0000 mg | ORAL_TABLET | Freq: Three times a day (TID) | ORAL | Status: DC

## 2024-02-23 MED ORDER — CALCITRIOL 0.5 MCG PO CAPS: 0.5000 ug | ORAL_CAPSULE | ORAL | Status: DC

## 2024-02-23 NOTE — Progress Notes (Addendum)
 9:15am: Patient will go to room 3205 at [facility]. Patient will be transported via [facility] - RN to call when ready. The number to call for report is [phone], ext. [phone].  8am: CSW spoke with patient at bedside to present her with bed offers. Patient agreeable to accept bed offer from [facility]. Patient states she has two doctors appointments on 03/02/24 - [facility] @ 10:30am and [facility] at 1pm. CSW will inform facility representative of information.  CSW notified RN of information.  CSW spoke with Marylene Land at [facility] to inform her of information.  Edwin Dada, MSW, LCSW Transitions of Care  Clinical Social Worker II [phone]

## 2024-02-23 NOTE — ED Provider Notes (Signed)
 Emergency Medicine Observation Re-evaluation Note  Colleen Gray is a 73 y.o. female, seen on rounds today.  Pt initially presented to the ED for complaints of Hip Pain Currently, the patient is resting comfortably after eating some breakfast.  Physical Exam  BP (!) 162/65   Pulse 76   Temp 98.4 F (36.9 C) (Oral)   Resp 15   Ht 5\' 3"  (1.6 m)   Wt 106 kg   LMP 12/23/2007 (Approximate)   SpO2 96%   BMI 41.40 kg/m  Physical Exam General: Resting without agitation Cardiac: No murmur on my exam Lungs: Lungs clear bilaterally Psych: No agitation at this time  ED Course / MDM  EKG:   I have reviewed the labs performed to date as well as medications administered while in observation.  Recent changes in the last 24 hours include none reported.  Plan  Current plan is for placement to [facility] likely today however patient reported that she was post to have an ultrasound to rule out DVT in her left upper arm and the forearm.  It did not appear to been ordered yet so I will order it now.  Anticipate placement after ultrasound is completed.  12:13 PM Ultrasound preliminary read was negative for clot.  Will order home meds and await placement for her.  2:52 PM Was informed patient is now able to go to [place] for rehab placement.  Will discharge to go to the rehab facility.   Colleen Gray, Canary Brim, MD 02/23/24 1452

## 2024-02-25 ENCOUNTER — Encounter (HOSPITAL_COMMUNITY): Payer: Medicare Other

## 2024-03-02 ENCOUNTER — Encounter (HOSPITAL_COMMUNITY): Payer: Medicare Other

## 2024-04-15 ENCOUNTER — Other Ambulatory Visit: Payer: Self-pay

## 2024-04-15 DIAGNOSIS — N186 End stage renal disease: Secondary | ICD-10-CM

## 2024-04-18 ENCOUNTER — Ambulatory Visit (HOSPITAL_COMMUNITY)
Admission: RE | Admit: 2024-04-18 | Discharge: 2024-04-18 | Disposition: A | Discharge status: Home / Self Care | Source: Ambulatory Visit | Attending: Surgery | Admitting: Surgery

## 2024-04-18 DIAGNOSIS — N186 End stage renal disease: Secondary | ICD-10-CM | POA: Diagnosis not present

## 2024-06-06 ENCOUNTER — Telehealth: Payer: Self-pay

## 2024-06-06 ENCOUNTER — Ambulatory Visit: Attending: Surgery | Admitting: Surgery

## 2024-06-06 ENCOUNTER — Encounter: Payer: Self-pay | Admitting: Surgery

## 2024-06-06 VITALS — BP 109/73 | HR 71 | Temp 98.0°F

## 2024-06-06 DIAGNOSIS — N186 End stage renal disease: Secondary | ICD-10-CM | POA: Diagnosis present

## 2024-06-06 DIAGNOSIS — Z992 Dependence on renal dialysis: Secondary | ICD-10-CM | POA: Diagnosis present

## 2024-06-06 NOTE — Telephone Encounter (Signed)
 Left arm fistulogram request from Dr. Charlotte Cookey faxed to renal lab.

## 2024-06-06 NOTE — Progress Notes (Signed)
 [facility]  Patient name: Colleen Gray MRN: [id number] DOB: 1951-05-18 Sex: female   REASON FOR VISIT:    Follow-up  HISOTRY OF PRESENT ILLNESS:    Shyne Resch is a 73 y.o. female who is status post second stage left basilic vein fistula creation on 09/19/2022 she was seen for her follow-up visit on 10/13/2022 and was doing well without signs of steal.  She is now complaining that her left arm is cooler than the right.  She will also get the hand to lock up.  She does not have any wounds.  The patient does say they have difficulty cannulating the top part of her fistula.  PAST MEDICAL HISTORY:   Past Medical History:  Diagnosis Date   Anemia    Arthritis    Asthma    Bradycardia    Chronic renal failure, stage 4 (severe) (HCC) 2017   no dialysis at this time, ESRD but not on HD yet   Complication of anesthesia    during anesthesia heart rate is low    Enlarged heart    Sees Dr. Berry Bristol   Heart murmur    due to rheumatic fever - Sees Dr Berry Bristol   Hemorrhoid    History of kidney stones    History of rheumatic fever    with a heart murmer   Hypertension    Ovarian cyst 1985   removed   Pneumonia    Pre-diabetes    no meds   Psoriasis    Sleep apnea    does not use Cpap (after weight loss no longer needed)     FAMILY HISTORY:   Family History  Problem Relation Age of Onset   Colon cancer Mother 61   Diabetes Mother    Hypertension Mother    Diabetes Father    Hypertension Father    Colon cancer Maternal Grandmother 94   Diabetes Paternal Grandmother    Diabetes Paternal Grandfather    Breast cancer Neg Hx     SOCIAL HISTORY:   Social History   Tobacco Use   Smoking status: Never   Smokeless tobacco: Never  Substance Use Topics   Alcohol use: No    Alcohol/week: 0.0 standard drinks of alcohol     ALLERGIES:   Allergies  Allergen Reactions   Nifedipine Swelling    Other     Surgery Glue caused blisters   Penicillins Rash     CURRENT MEDICATIONS:   Current Outpatient Medications  Medication Sig Dispense Refill   Alpha-Lipoic Acid 300 MG CAPS Take 300 mg by mouth daily.     amLODipine  (NORVASC ) 10 MG tablet Take 10 mg by mouth daily.     Blood Glucose Monitoring Suppl (GLUCOCOM BLOOD GLUCOSE MONITOR) DEVI Use daily to check blood sugar.     calcitRIOL  (ROCALTROL ) 0.5 MCG capsule Take 0.5 mcg by mouth every Monday, Wednesday, and Friday.     Calcium  Carbonate Antacid (TUMS ULTRA 1000 PO) Take 1,000 mg by mouth 3 (three) times daily.     Cholecalciferol (VITAMIN D3) 250 MCG (10000 UT) capsule Take 10,000 Units by mouth every Tuesday.     ferrous sulfate  325 (65 FE) MG tablet Take 325 mg by mouth daily with breakfast.     folic acid (FOLVITE) 1 MG tablet Take 1 mg by mouth daily.     furosemide  (LASIX ) 40 MG tablet Take 40-80 mg by mouth daily. Take 2 tablets by mouth daily for 2 weeks,  then resume taking 1 tablet daily afterwards     labetalol  (NORMODYNE ) 200 MG tablet Take 200 mg by mouth 2 (two) times daily.     Multiple Vitamin (MULTIVITAMIN WITH MINERALS) TABS tablet Take 1 tablet by mouth daily.     nitroGLYCERIN  (NITROSTAT ) 0.4 MG SL tablet Place 0.4 mg under the tongue every 5 (five) minutes as needed for chest pain.     Omega 3 1000 MG CAPS Take 1,000 mg by mouth daily.     OVER THE COUNTER MEDICATION Take 1 capsule by mouth daily. Fruit and Vegetable Blend     sevelamer  carbonate (RENVELA ) 800 MG tablet Take 1,600 mg by mouth 3 (three) times daily with meals.     sodium bicarbonate  650 MG tablet Take 650 mg by mouth 3 (three) times daily.     vitamin B-12 (CYANOCOBALAMIN) 1000 MCG tablet Take 1,000 mcg by mouth every Monday, Wednesday, and Friday.     vitamin C  (ASCORBIC ACID) 500 MG tablet Take 500 mg by mouth daily.     No current facility-administered medications for this visit.    REVIEW OF SYSTEMS:   [X]  denotes positive finding, [  ] denotes negative finding Cardiac  Comments:  Chest pain or chest pressure:    Shortness of breath upon exertion:    Short of breath when lying flat:    Irregular heart rhythm:        Vascular    Pain in calf, thigh, or hip brought on by ambulation:    Pain in feet at night that wakes you up from your sleep:     Blood clot in your veins:    Leg swelling:         Pulmonary    Oxygen at home:    Productive cough:     Wheezing:         Neurologic    Sudden weakness in arms or legs:     Sudden numbness in arms or legs:     Sudden onset of difficulty speaking or slurred speech:    Temporary loss of vision in one eye:     Problems with dizziness:         Gastrointestinal    Blood in stool:     Vomited blood:         Genitourinary    Burning when urinating:     Blood in urine:        Psychiatric    Major depression:         Hematologic    Bleeding problems:    Problems with blood clotting too easily:        Skin    Rashes or ulcers:        Constitutional    Fever or chills:      PHYSICAL EXAM:   Vitals:   06/06/24 1421  BP: 109/73  Pulse: 71  Temp: 98 F (36.7 C)  SpO2: 98%    GENERAL: The patient is a well-nourished female, in no acute distress. The vital signs are documented above. CARDIAC: There is a regular rate and rhythm.  VASCULAR: Excellent thrill within fistula.  Palpable left radial pulse PULMONARY: Non-labored respirations MUSCULOSKELETAL: There are no major deformities or cyanosis. NEUROLOGIC: No focal weakness or paresthesias are detected. SKIN: There are no ulcers or rashes noted. PSYCHIATRIC: The patient has a normal affect.  STUDIES:   I have reviewed the following ultrasound: The left radial PSV has no change with compression.  No significant  change with fistula compression  Duplex imaging of the fistula demonstrates two areas of narrowing in the  mid  upper arm and distal lupper arm.  Aaron Aas  MEDICAL ISSUES:   ESRD: I do not feel  that the patient has steal syndrome.  Her ultrasound did not show any significant changes with fistula compression in her radial artery.  In addition she has palpable radial pulse.  She was found to have 2 areas of stenosis in her basilic vein fistula which likely explains why they are having difficulty with cannulating this at the top.  I would recommend fistulogram.  Access can be near the antecubital crease and anticipated intervention in the basilic vein up near the axilla.  This will be scheduled in the near future on a nondialysis day in the dialysis access center    Gareld June, IV, MD, FACS [facility] [phone] Pager [phone]

## 2024-06-10 ENCOUNTER — Encounter (HOSPITAL_COMMUNITY)
Admission: RE | Disposition: A | Discharge status: Home / Self Care | Payer: Self-pay | Source: Home / Self Care | Attending: Vascular Surgery

## 2024-06-10 ENCOUNTER — Ambulatory Visit (HOSPITAL_COMMUNITY)
Admission: RE | Admit: 2024-06-10 | Discharge: 2024-06-10 | Disposition: A | Discharge status: Home / Self Care | Attending: Vascular Surgery | Admitting: Vascular Surgery

## 2024-06-10 ENCOUNTER — Other Ambulatory Visit: Payer: Self-pay

## 2024-06-10 DIAGNOSIS — Y832 Surgical operation with anastomosis, bypass or graft as the cause of abnormal reaction of the patient, or of later complication, without mention of misadventure at the time of the procedure: Secondary | ICD-10-CM | POA: Diagnosis not present

## 2024-06-10 DIAGNOSIS — N186 End stage renal disease: Secondary | ICD-10-CM | POA: Diagnosis not present

## 2024-06-10 DIAGNOSIS — Z992 Dependence on renal dialysis: Secondary | ICD-10-CM | POA: Insufficient documentation

## 2024-06-10 DIAGNOSIS — T82858A Stenosis of vascular prosthetic devices, implants and grafts, initial encounter: Secondary | ICD-10-CM

## 2024-06-10 DIAGNOSIS — I120 Hypertensive chronic kidney disease with stage 5 chronic kidney disease or end stage renal disease: Secondary | ICD-10-CM | POA: Diagnosis not present

## 2024-06-10 HISTORY — PX: A/V SHUNT INTERVENTION: CATH118220

## 2024-06-10 HISTORY — PX: VENOUS ANGIOPLASTY: CATH118376

## 2024-06-10 LAB — GLUCOSE, CAPILLARY: Glucose-Capillary: 134 mg/dL — ABNORMAL HIGH (ref 70–99)

## 2024-06-10 SURGERY — A/V SHUNT INTERVENTION
Anesthesia: LOCAL | Laterality: Left

## 2024-06-10 MED ORDER — IODIXANOL 320 MG/ML IV SOLN
INTRAVENOUS | Status: DC | PRN
  Administered 2024-06-10: 22 mL via INTRAVENOUS

## 2024-06-10 MED ORDER — LIDOCAINE HCL (PF) 1 % IJ SOLN
INTRAMUSCULAR | Status: DC | PRN
  Administered 2024-06-10: 5 mL

## 2024-06-10 MED ORDER — HEPARIN (PORCINE) IN NACL 1000-0.9 UT/500ML-% IV SOLN
INTRAVENOUS | Status: DC | PRN
  Administered 2024-06-10: 500 mL

## 2024-06-10 MED ORDER — LIDOCAINE HCL (PF) 1 % IJ SOLN
INTRAMUSCULAR | Status: AC
Start: 2024-06-10 — End: 2024-06-10
  Filled 2024-06-10: qty 30

## 2024-06-10 SURGICAL SUPPLY — 9 items
BALLOON ATHLETIS 7.0X80X75 (BALLOONS) IMPLANT
BALLOON MUSTANG 8X80X75 (BALLOONS) IMPLANT
KIT MICROPUNCTURE NIT STIFF (SHEATH) IMPLANT
MAT PREVALON FULL STRYKER (MISCELLANEOUS) IMPLANT
SHEATH PINNACLE R/O II 6F 4CM (SHEATH) IMPLANT
SHEATH PROBE COVER 6X72 (BAG) IMPLANT
TRAY PV CATH (CUSTOM PROCEDURE TRAY) ×2 IMPLANT
TUBING CIL FLEX 10 FLL-RA (TUBING) IMPLANT
WIRE BENTSON .035X145CM (WIRE) IMPLANT

## 2024-06-10 NOTE — Op Note (Signed)
    Patient name: Colleen Gray MRN: [id number] DOB: 1951/11/10 Sex: female  06/10/2024 Pre-operative Diagnosis: Malfunction of left brachiobasilic AV fistula Post-operative diagnosis:  Same Surgeon:  Young Hensen, MD Procedure Performed: 1.  Ultrasound-guided access left brachiobasilic AV fistula 2.  Left upper extremity fistulogram including central venogram 3.  Left peripheral angioplasty basilic vein fistula (7 mm x 80 mm Athletis and 8 mm x 80 mm Mustang)  Indications: 73 year old female with ESRD using a left brachiobasilic AV fistula.  She presents for left upper extremity fistulogram and possible invention after risks benefits discussed.  Findings:   Ultrasound-guided access left brachiobasilic AV fistula.  No evidence of central venous stenosis.  In the upper arm proximally she had 2 tandem stenoses about 60% each.  Reflux shot showed that the vein is small by the arterial anastomosis but there is no focal stenosis.  The two left upper arm stenosis within the basilic vein was treated with a 7 mm and 8 mm angioplasty balloon.  Widely patent at completion.  Much better thrill.   Procedure:  The patient was identified in the holding area and taken to [facility] lab.  Placed on the table in supine position.  The left arm was prepped draped standard sterile fashion.  Timeout was performed.  Ultimately ultrasound was used to access the left brachiobasilic fistula at antecubitum on the left under arm.  This was then accessed with a micro access needle and placed a microwire and a micro sheath.  I did inject 1% lidocaine  without epinephrine .  Left upper extremity fistulogram was performed with pertinent findings noted above.  The stenosis in the upper arm basilic vein fistula was then treated after we upsized to a 6 French sheath over a Bentson wire and this was treated with a 7 mm x 80 mm Athletis to nominal pressure for 2 minutes and then an 8 mm x 80 mm Mustang to nominal pressure for 2  minutes.  Widely patent at completion.  Better thrill and sheath was removed and tied down the pursestring.     Young Hensen, MD [facility] [place] Office: [phone]

## 2024-06-10 NOTE — H&P (Signed)
 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  The various methods of treatment have been discussed with the patient and family. After consideration of risks, benefits and other options for treatment, the patient has consented to  Procedure(s): A/V SHUNT INTERVENTION (Left) as a surgical intervention.  The patient's history has been reviewed, patient examined, no change in status, stable for surgery.  I have reviewed the patient's chart and labs.  Questions were answered to the patient's satisfaction.     Colleen Gray                           [facility] [place]   Patient name: Colleen Gray       MRN: [id number]        DOB: Sep 10, 1951        Sex: female     REASON FOR VISIT:      Follow-up   HISOTRY OF PRESENT ILLNESS:      Colleen Gray is a 73 y.o. female who is status post second stage left basilic vein fistula creation on 09/19/2022 she was seen for her follow-up visit on 10/13/2022 and was doing well without signs of steal.  She is now complaining that her left arm is cooler than the right.  She will also get the hand to lock up.  She does not have any wounds.   The patient does say they have difficulty cannulating the top part of her fistula.   PAST MEDICAL HISTORY:        Past Medical History:  Diagnosis Date   Anemia     Arthritis     Asthma     Bradycardia     Chronic renal failure, stage 4 (severe) (HCC) 2017    no dialysis at this time, ESRD but not on HD yet   Complication of anesthesia      during anesthesia heart rate is low    Enlarged heart      Sees Dr. Berry Bristol   Heart murmur      due to rheumatic fever - Sees Dr Berry Bristol   Hemorrhoid     History of kidney stones     History of rheumatic fever      with a heart murmer   Hypertension     Ovarian cyst 1985    removed   Pneumonia     Pre-diabetes      no meds    Psoriasis     Sleep apnea      does not use Cpap (after weight loss no longer needed)            FAMILY HISTORY:         Family History  Problem Relation Age of Onset   Colon cancer Mother 72   Diabetes Mother     Hypertension Mother     Diabetes Father     Hypertension Father     Colon cancer Maternal Grandmother 93   Diabetes Paternal Grandmother     Diabetes Paternal Grandfather     Breast cancer Neg Hx            SOCIAL HISTORY:    Social History         Tobacco Use   Smoking status: Never   Smokeless tobacco: Never  Substance Use Topics   Alcohol  use: No      Alcohol/week: 0.0 standard drinks of alcohol        ALLERGIES:    Allergies       Allergies  Allergen Reactions   Nifedipine Swelling   Other        Surgery Glue caused blisters   Penicillins Rash          CURRENT MEDICATIONS:          Current Outpatient Medications  Medication Sig Dispense Refill   Alpha-Lipoic Acid 300 MG CAPS Take 300 mg by mouth daily.       amLODipine  (NORVASC ) 10 MG tablet Take 10 mg by mouth daily.       Blood Glucose Monitoring Suppl (GLUCOCOM BLOOD GLUCOSE MONITOR) DEVI Use daily to check blood sugar.       calcitRIOL  (ROCALTROL ) 0.5 MCG capsule Take 0.5 mcg by mouth every Monday, Wednesday, and Friday.       Calcium  Carbonate Antacid (TUMS ULTRA 1000 PO) Take 1,000 mg by mouth 3 (three) times daily.       Cholecalciferol (VITAMIN D3) 250 MCG (10000 UT) capsule Take 10,000 Units by mouth every Tuesday.       ferrous sulfate  325 (65 FE) MG tablet Take 325 mg by mouth daily with breakfast.       folic acid (FOLVITE) 1 MG tablet Take 1 mg by mouth daily.       furosemide  (LASIX ) 40 MG tablet Take 40-80 mg by mouth daily. Take 2 tablets by mouth daily for 2 weeks, then resume taking 1 tablet daily afterwards       labetalol  (NORMODYNE ) 200 MG tablet Take 200 mg by mouth 2 (two) times daily.       Multiple Vitamin (MULTIVITAMIN WITH MINERALS) TABS tablet Take 1  tablet by mouth daily.       nitroGLYCERIN  (NITROSTAT ) 0.4 MG SL tablet Place 0.4 mg under the tongue every 5 (five) minutes as needed for chest pain.       Omega 3 1000 MG CAPS Take 1,000 mg by mouth daily.       OVER THE COUNTER MEDICATION Take 1 capsule by mouth daily. Fruit and Vegetable Blend       sevelamer  carbonate (RENVELA ) 800 MG tablet Take 1,600 mg by mouth 3 (three) times daily with meals.       sodium bicarbonate  650 MG tablet Take 650 mg by mouth 3 (three) times daily.       vitamin B-12 (CYANOCOBALAMIN) 1000 MCG tablet Take 1,000 mcg by mouth every Monday, Wednesday, and Friday.       vitamin C  (ASCORBIC ACID) 500 MG tablet Take 500 mg by mouth daily.          No current facility-administered medications for this visit.        REVIEW OF SYSTEMS:    [X]  denotes positive finding, [ ]  denotes negative finding Cardiac   Comments:  Chest pain or chest pressure:      Shortness of breath upon exertion:      Short of breath when lying flat:      Irregular heart rhythm:             Vascular      Pain in calf, thigh, or hip brought on by ambulation:      Pain in feet at night that wakes you up from your sleep:       Blood clot in your veins:      Leg swelling:  Pulmonary      Oxygen at home:      Productive cough:       Wheezing:              Neurologic      Sudden weakness in arms or legs:       Sudden numbness in arms or legs:       Sudden onset of difficulty speaking or slurred speech:      Temporary loss of vision in one eye:       Problems with dizziness:              Gastrointestinal      Blood in stool:       Vomited blood:              Genitourinary      Burning when urinating:       Blood in urine:             Psychiatric      Major depression:              Hematologic      Bleeding problems:      Problems with blood clotting too easily:             Skin      Rashes or ulcers:             Constitutional      Fever or chills:           PHYSICAL EXAM:       Vitals:    06/06/24 1421  BP: 109/73  Pulse: 71  Temp: 98 F (36.7 C)  SpO2: 98%      GENERAL: The patient is a well-nourished female, in no acute distress. The vital signs are documented above. CARDIAC: There is a regular rate and rhythm.  VASCULAR: Excellent thrill within fistula.  Palpable left radial pulse PULMONARY: Non-labored respirations MUSCULOSKELETAL: There are no major deformities or cyanosis. NEUROLOGIC: No focal weakness or paresthesias are detected. SKIN: There are no ulcers or rashes noted. PSYCHIATRIC: The patient has a normal affect.   STUDIES:    I have reviewed the following ultrasound: The left radial PSV has no change with compression.  No significant change with fistula compression  Duplex imaging of the fistula demonstrates two areas of narrowing in the  mid  upper arm and distal lupper arm.  Aaron Aas  MEDICAL ISSUES:    ESRD: I do not feel that the patient has steal syndrome.  Her ultrasound did not show any significant changes with fistula compression in her radial artery.  In addition she has palpable radial pulse.  She was found to have 2 areas of stenosis in her basilic vein fistula which likely explains why they are having difficulty with cannulating this at the top.  I would recommend fistulogram.  Access can be near the antecubital crease and anticipated intervention in the basilic vein up near the axilla.  This will be scheduled in the near future on a nondialysis day in the dialysis access center       Gareld June, IV, MD, FACS [facility] [phone] Pager [phone]

## 2024-06-13 ENCOUNTER — Ambulatory Visit (INDEPENDENT_AMBULATORY_CARE_PROVIDER_SITE_OTHER): Admitting: Podiatry

## 2024-06-13 ENCOUNTER — Encounter (HOSPITAL_COMMUNITY): Payer: Self-pay

## 2024-06-13 ENCOUNTER — Encounter (HOSPITAL_COMMUNITY): Payer: Self-pay | Admitting: Vascular Surgery

## 2024-06-13 DIAGNOSIS — M216X2 Other acquired deformities of left foot: Secondary | ICD-10-CM

## 2024-06-13 DIAGNOSIS — L84 Corns and callosities: Secondary | ICD-10-CM | POA: Diagnosis not present

## 2024-06-13 DIAGNOSIS — M2041 Other hammer toe(s) (acquired), right foot: Secondary | ICD-10-CM | POA: Diagnosis not present

## 2024-06-13 DIAGNOSIS — M216X1 Other acquired deformities of right foot: Secondary | ICD-10-CM | POA: Diagnosis not present

## 2024-06-13 DIAGNOSIS — E1142 Type 2 diabetes mellitus with diabetic polyneuropathy: Secondary | ICD-10-CM | POA: Diagnosis not present

## 2024-06-13 DIAGNOSIS — M21619 Bunion of unspecified foot: Secondary | ICD-10-CM

## 2024-06-13 MED ORDER — CICLOPIROX 8 % EX SOLN: Freq: Every day | CUTANEOUS | 2 refills | Status: DC

## 2024-06-13 NOTE — Patient Instructions (Signed)

## 2024-06-14 NOTE — Progress Notes (Signed)
  Subjective:  Patient ID: Colleen Gray, female    DOB: 10-Feb-1951,  MRN: [id number]  Chief Complaint  Patient presents with   [facility]    RM#11 Bottom of both feet callouses/Diabetic foot exam/ referral for diabetic shoes at [facility]    Discussed the use of AI scribe software for clinical note transcription with the patient, who gave verbal consent to proceed.  History of Present Illness Colleen Gray is a 73 year old female with diabetes who presents for a diabetic foot exam and callus management.  She has persistent calluses on both feet, particularly on the left foot, which are difficult to manage despite regular trimming. Moisturizer is used regularly, but none was applied on the day of the visit. There are no open wounds, injuries, numbness, or tingling in her feet.  She is concerned about potential fungal issues on her big toe and fourth toe due to changes in nail appearance. A blackened pinky toenail is present, attributed to trauma about a month ago, with a new nail starting to grow.  Her blood sugar levels have been higher than usual, attributed to a steroid injection received a month ago, with a current level of 148 mg/dL, previously reaching the 160s, but now slowly decreasing.  She has previously been diagnosed with neuropathy.      Objective:    Physical Exam General: AAO x3, NAD  Dermatological: Thick hyperkeratotic lesions are noted on bilateral heels left side worse than the right.  There is no underlying ulceration, drainage or signs of infection.  Dried blood present in the left foot digit toenail there is no extension of hyperpigmentation in the surrounding skin.  No edema, erythema around the toenail.  No open lesions otherwise.  Some of the nails are hypertrophic, dystrophic yellow discoloration.  No edema, erythema.  Vascular: Dorsalis Pedis artery and Posterior Tibial artery pedal pulses are palpable bilateral with immedate capillary fill time.  There is no pain with calf compression, swelling, warmth, erythema.   Neruologic: Sensation decreased with [employer] Speed monofilament  Musculoskeletal: Bony, hammertoes are present.  Gait: Unassisted, Nonantalgic.     Results    Assessment:     Callus    Type II diabetes mellitus with polyneuropathy  3.      Bunion 4.      Hammertoes  5.      Onychomycosis    Plan:  Patient was evaluated and treated and all questions answered.  Assessment and Plan Assessment & Plan Diabetes mellitus with foot complications Blood sugar levels elevated post-steroid injection but decreasing she does have a history of neuropathy with preulcerative calluses and I do think she will benefit from diabetic shoes, inserts.  Order to be faxed to [facility]. - Refer to [facility] for diabetic shoes and inserts.  Calluses on feet Thick calluses on both feet, especially left, due to pressure and altered foot positioning. Persistent despite reduction attempts. - Debrided the any complications or bleeding. - Refer to [facility] for diabetic shoes and inserts to reduce pressure. - Schedule follow-up every three months for callus management.  Fungal infection of toenails Possible fungal infection on big toe and both fifth toes. Big toenail thickened and loosening. New nail growth on left fifth toe with dry blood. - Prescribe Penlac for antifungal treatment on the big toe and both fifth toes.    Return in about 3 months (around 09/13/2024) for calluses, fungus.   [name] Fees DPM

## 2024-06-15 ENCOUNTER — Encounter (INDEPENDENT_AMBULATORY_CARE_PROVIDER_SITE_OTHER): Payer: Self-pay

## 2024-06-27 ENCOUNTER — Ambulatory Visit (HOSPITAL_COMMUNITY)
Admission: RE | Admit: 2024-06-27 | Discharge: 2024-06-27 | Disposition: A | Discharge status: Home / Self Care | Attending: Vascular Surgery | Admitting: Vascular Surgery

## 2024-06-27 ENCOUNTER — Other Ambulatory Visit: Payer: Self-pay

## 2024-06-27 ENCOUNTER — Encounter (HOSPITAL_COMMUNITY)
Admission: RE | Disposition: A | Discharge status: Home / Self Care | Payer: Self-pay | Source: Home / Self Care | Attending: Vascular Surgery

## 2024-06-27 DIAGNOSIS — Y832 Surgical operation with anastomosis, bypass or graft as the cause of abnormal reaction of the patient, or of later complication, without mention of misadventure at the time of the procedure: Secondary | ICD-10-CM | POA: Insufficient documentation

## 2024-06-27 DIAGNOSIS — I120 Hypertensive chronic kidney disease with stage 5 chronic kidney disease or end stage renal disease: Secondary | ICD-10-CM | POA: Insufficient documentation

## 2024-06-27 DIAGNOSIS — N186 End stage renal disease: Secondary | ICD-10-CM

## 2024-06-27 DIAGNOSIS — T82858A Stenosis of vascular prosthetic devices, implants and grafts, initial encounter: Secondary | ICD-10-CM | POA: Diagnosis not present

## 2024-06-27 DIAGNOSIS — Z992 Dependence on renal dialysis: Secondary | ICD-10-CM | POA: Diagnosis not present

## 2024-06-27 HISTORY — PX: VENOUS ANGIOPLASTY: CATH118376

## 2024-06-27 HISTORY — PX: A/V SHUNT INTERVENTION: CATH118220

## 2024-06-27 SURGERY — A/V SHUNT INTERVENTION
Anesthesia: LOCAL

## 2024-06-27 MED ORDER — LIDOCAINE HCL (PF) 1 % IJ SOLN
INTRAMUSCULAR | Status: DC | PRN
  Administered 2024-06-27: 2 mL

## 2024-06-27 MED ORDER — LIDOCAINE HCL (PF) 1 % IJ SOLN
INTRAMUSCULAR | Status: AC
  Filled 2024-06-27: qty 30

## 2024-06-27 MED ORDER — IODIXANOL 320 MG/ML IV SOLN
INTRAVENOUS | Status: DC | PRN
  Administered 2024-06-27: 35 mL via INTRAVENOUS

## 2024-06-27 MED ORDER — HEPARIN (PORCINE) IN NACL 1000-0.9 UT/500ML-% IV SOLN
INTRAVENOUS | Status: DC | PRN
  Administered 2024-06-27: 500 mL

## 2024-06-27 SURGICAL SUPPLY — 11 items
BALLOON MUSTANG 5X80X75 (BALLOONS) IMPLANT
BALLOON MUSTANG 6X80X75 (BALLOONS) IMPLANT
GUIDEWIRE ANGLED .035 180CM (WIRE) IMPLANT
KIT ENCORE 26 ADVANTAGE (KITS) IMPLANT
KIT MICROPUNCTURE NIT STIFF (SHEATH) IMPLANT
MAT PREVALON FULL STRYKER (MISCELLANEOUS) IMPLANT
SHEATH PINNACLE R/O II 6F 4CM (SHEATH) IMPLANT
SHEATH PROBE COVER 6X72 (BAG) IMPLANT
STOPCOCK MORSE 400PSI 3WAY (MISCELLANEOUS) IMPLANT
TRAY PV CATH (CUSTOM PROCEDURE TRAY) ×2 IMPLANT
TUBING CIL FLEX 10 FLL-RA (TUBING) IMPLANT

## 2024-06-27 NOTE — H&P (Signed)
 See below for H&P details. Plan fistulagram for continued issues with fistula at dialysis.  Debby SAILOR. Magda, MD [facility] Phone Number: [phone] 06/27/2024 8:45 AM                                       [facility] [place]  Patient name: Colleen Gray MRN: [id number] DOB: June 30, 1951 Sex: female   REASON FOR VISIT:    Follow-up  HISOTRY OF PRESENT ILLNESS:    Colleen Gray is a 73 y.o. female who is status post second stage left basilic vein fistula creation on 09/19/2022 she was seen for her follow-up visit on 10/13/2022 and was doing well without signs of steal.  She is now complaining that her left arm is cooler than the right.  She will also get the hand to lock up.  She does not have any wounds.  The patient does say they have difficulty cannulating the top part of her fistula.  PAST MEDICAL HISTORY:   Past Medical History:  Diagnosis Date   Anemia    Arthritis    Asthma    Bradycardia    Chronic renal failure, stage 4 (severe) (HCC) 2017   no dialysis at this time, ESRD but not on HD yet   Complication of anesthesia    during anesthesia heart rate is low    Enlarged heart    Sees Dr. Ladona   Heart murmur    due to rheumatic fever - Sees Dr Ladona   Hemorrhoid    History of kidney stones    History of rheumatic fever    with a heart murmer   Hypertension    Ovarian cyst 1985   removed   Pneumonia    Pre-diabetes    no meds   Psoriasis    Sleep apnea    does not use Cpap (after weight loss no longer needed)     FAMILY HISTORY:   Family History  Problem Relation Age of Onset   Colon cancer Mother 95   Diabetes Mother    Hypertension Mother    Diabetes Father    Hypertension Father    Colon cancer Maternal Grandmother 31   Diabetes Paternal Grandmother    Diabetes Paternal Grandfather    Breast cancer Neg Hx     SOCIAL HISTORY:   Social History   Tobacco Use    Smoking status: Never   Smokeless tobacco: Never  Substance Use Topics   Alcohol use: No    Alcohol/week: 0.0 standard drinks of alcohol     ALLERGIES:   Allergies  Allergen Reactions   Nifedipine Swelling   Other     Surgery Glue caused blisters   Penicillins Rash     CURRENT MEDICATIONS:   No current facility-administered medications for this encounter.   Current Outpatient Medications  Medication Sig Dispense Refill   Alpha-Lipoic Acid 300 MG CAPS Take 300 mg by mouth daily.     amLODipine  (NORVASC ) 10 MG tablet Take 10 mg by mouth daily.     Blood Glucose Monitoring Suppl (GLUCOCOM BLOOD GLUCOSE MONITOR) DEVI Use daily to check blood sugar.     calcitRIOL  (ROCALTROL ) 0.5 MCG capsule Take 0.5 mcg by mouth every Monday, Wednesday, and Friday.     Calcium  Carbonate Antacid (TUMS ULTRA 1000 PO) Take 1,000 mg by mouth 3 (three)  times daily.     Cholecalciferol (VITAMIN D3) 250 MCG (10000 UT) capsule Take 10,000 Units by mouth every Tuesday.     ciclopirox  (PENLAC ) 8 % solution Apply topically at bedtime. Apply over nail and surrounding skin. Apply daily over previous coat. After seven (7) days, may remove with alcohol and continue cycle. 6.6 mL 2   ferrous sulfate  325 (65 FE) MG tablet Take 325 mg by mouth daily with breakfast.     folic acid (FOLVITE) 1 MG tablet Take 1 mg by mouth daily.     furosemide  (LASIX ) 40 MG tablet Take 40-80 mg by mouth daily. Take 2 tablets by mouth daily for 2 weeks, then resume taking 1 tablet daily afterwards     labetalol  (NORMODYNE ) 200 MG tablet Take 200 mg by mouth 2 (two) times daily.     Multiple Vitamin (MULTIVITAMIN WITH MINERALS) TABS tablet Take 1 tablet by mouth daily.     nitroGLYCERIN  (NITROSTAT ) 0.4 MG SL tablet Place 0.4 mg under the tongue every 5 (five) minutes as needed for chest pain.     Omega 3 1000 MG CAPS Take 1,000 mg by mouth daily.     OVER THE COUNTER MEDICATION Take 1 capsule by mouth daily. Fruit and Vegetable Blend      sevelamer  carbonate (RENVELA ) 800 MG tablet Take 1,600 mg by mouth 3 (three) times daily with meals.     sodium bicarbonate  650 MG tablet Take 650 mg by mouth 3 (three) times daily.     vitamin B-12 (CYANOCOBALAMIN) 1000 MCG tablet Take 1,000 mcg by mouth every Monday, Wednesday, and Friday.     vitamin C  (ASCORBIC ACID) 500 MG tablet Take 500 mg by mouth daily.      REVIEW OF SYSTEMS:   [X]  denotes positive finding, [ ]  denotes negative finding Cardiac  Comments:  Chest pain or chest pressure:    Shortness of breath upon exertion:    Short of breath when lying flat:    Irregular heart rhythm:        Vascular    Pain in calf, thigh, or hip brought on by ambulation:    Pain in feet at night that wakes you up from your sleep:     Blood clot in your veins:    Leg swelling:         Pulmonary    Oxygen at home:    Productive cough:     Wheezing:         Neurologic    Sudden weakness in arms or legs:     Sudden numbness in arms or legs:     Sudden onset of difficulty speaking or slurred speech:    Temporary loss of vision in one eye:     Problems with dizziness:         Gastrointestinal    Blood in stool:     Vomited blood:         Genitourinary    Burning when urinating:     Blood in urine:        Psychiatric    Major depression:         Hematologic    Bleeding problems:    Problems with blood clotting too easily:        Skin    Rashes or ulcers:        Constitutional    Fever or chills:      PHYSICAL EXAM:   Vitals:   06/27/24 9276 06/27/24 0825  BP: 115/67 (!) 134/56  Pulse: 67 (!) 59  Resp: 12 12  SpO2: 99% 97%    GENERAL: The patient is a well-nourished female, in no acute distress. The vital signs are documented above. CARDIAC: There is a regular rate and rhythm.  VASCULAR: Excellent thrill within fistula.  Palpable left radial pulse PULMONARY: Non-labored respirations MUSCULOSKELETAL: There are no major deformities or cyanosis. NEUROLOGIC:  No focal weakness or paresthesias are detected. SKIN: There are no ulcers or rashes noted. PSYCHIATRIC: The patient has a normal affect.  STUDIES:   I have reviewed the following ultrasound: The left radial PSV has no change with compression.  No significant change with fistula compression  Duplex imaging of the fistula demonstrates two areas of narrowing in the  mid  upper arm and distal lupper arm.  SABRA  MEDICAL ISSUES:   ESRD: I do not feel that the patient has steal syndrome.  Her ultrasound did not show any significant changes with fistula compression in her radial artery.  In addition she has palpable radial pulse.  She was found to have 2 areas of stenosis in her basilic vein fistula which likely explains why they are having difficulty with cannulating this at the top.  I would recommend fistulogram.  Access can be near the antecubital crease and anticipated intervention in the basilic vein up near the axilla.  This will be scheduled in the near future on a nondialysis day in the dialysis access center    Malvina New, IV, MD, FACS [facility] [phone] Pager [phone]

## 2024-06-27 NOTE — Op Note (Signed)
 DATE OF SERVICE: 06/27/2024  PATIENT:  Colleen Gray  73 y.o. female  PRE-OPERATIVE DIAGNOSIS:  ESRD  POST-OPERATIVE DIAGNOSIS:  Same  PROCEDURE:   1) ultrasound guided left arm brachiobasilic AVF access (CPT [phone]) 2) left arm fistulagram with angioplasty of anastomosis and peripheral fistula (CPT [phone]) 3) outpatient established patient evaluation and management (CPT [phone])  SURGEON:  Surgeons and Role:    * Magda Debby SAILOR, MD - Primary  ASSISTANT: none  ANESTHESIA:   local  EBL: minimal  BLOOD ADMINISTERED:none  DRAINS: none   LOCAL MEDICATIONS USED:  LIDOCAINE    SPECIMEN:  none  COUNTS: confirmed correct.  TOURNIQUET:  none  PATIENT DISPOSITION:  PACU - hemodynamically stable.   Delay start of Pharmacological VTE agent (>24hrs) due to surgical blood loss or risk of bleeding: no  INDICATION FOR PROCEDURE: Colleen Gray is a 73 y.o. female with ESRD on HD. She has a difficult to use left arm AVF. She recently underwent angioplasty of her fistula with improvement in performance, but she continues to have issues. After careful discussion of risks, benefits, and alternatives the patient was offered fistulagram. The patient understood and wished to proceed.  OPERATIVE FINDINGS: severe stenosis through anastomosis and peripheral fistula. Mild residual stenosis (~50%) in fistula in swing segment). No axillary, subclavian, innominate, or central venous stenosis. Improved thrill at completion.   DESCRIPTION OF PROCEDURE: After identification of the patient in the pre-operative holding area, the patient was transferred to the operating room. The patient was positioned supine on the operating room table. Anesthesia was induced. The left arm was prepped and draped in standard fashion. A surgical pause was performed confirming correct patient, procedure, and operative location.  Duplex ultrasound was used to access the left arm fistula towards the anastomosis with  micropuncture technique. Microsheath was introduced into the fistula. Fistulagram was performed in stations. See above for details. The lesion was crossed with a glidewire. Access was upsized to 45F. Angioplasty was performed of the anastomosis and peripheral fistula. Good result was noted on follow up angiogram. All endovascular equipment was removed. A figure of eight stitch was applied to the access site with good hemostasis. A bandage was applied.  Upon completion of the case instrument and sharps counts were confirmed correct. The patient was transferred to the PACU in good condition. I was present for all portions of the procedure.  FOLLOW UP PLAN: OK to use fistula. If she continues to have trouble with dialysis, she will need appointment to discuss new access creation.   Debby SAILOR. Magda, MD [facility] Phone Number: [phone] 06/27/2024 8:24 AM

## 2024-06-28 ENCOUNTER — Encounter (HOSPITAL_COMMUNITY): Payer: Self-pay | Admitting: Vascular Surgery

## 2024-07-13 ENCOUNTER — Ambulatory Visit: Attending: Internal Medicine

## 2024-07-13 DIAGNOSIS — R2689 Other abnormalities of gait and mobility: Secondary | ICD-10-CM | POA: Diagnosis present

## 2024-07-13 DIAGNOSIS — M6281 Muscle weakness (generalized): Secondary | ICD-10-CM | POA: Diagnosis present

## 2024-07-13 NOTE — Therapy (Signed)
 OUTPATIENT PHYSICAL THERAPY WHEELCHAIR EVALUATION   Patient Name: Colleen Gray MRN: [id number] DOB:September 07, 1951, 73 y.o., female Today's Date: 07/13/2024  END OF SESSION:  PT End of Session - 07/13/24 0928     Visit Number 1    Number of Visits 1    PT Start Time 0800    PT Stop Time 0925    PT Time Calculation (min) 85 min    Equipment Utilized During Treatment Gait belt    Activity Tolerance Patient tolerated treatment well    Behavior During Therapy WFL for tasks assessed/performed          Past Medical History:  Diagnosis Date   Anemia    Arthritis    Asthma    Bradycardia    Chronic renal failure, stage 4 (severe) (HCC) 2017   no dialysis at this time, ESRD but not on HD yet   Complication of anesthesia    during anesthesia heart rate is low    Enlarged heart    Sees Dr. Ladona   Heart murmur    due to rheumatic fever - Sees Dr Ladona   Hemorrhoid    History of kidney stones    History of rheumatic fever    with a heart murmer   Hypertension    Ovarian cyst 1985   removed   Pneumonia    Pre-diabetes    no meds   Psoriasis    Sleep apnea    does not use Cpap (after weight loss no longer needed)   Past Surgical History:  Procedure Laterality Date   A/V SHUNT INTERVENTION Left 06/10/2024   Procedure: A/V SHUNT INTERVENTION;  Surgeon: Gretta Lonni PARAS, MD;  Location: [facility];  Service: Cardiovascular;  Laterality: Left;   A/V SHUNT INTERVENTION N/A 06/27/2024   Procedure: A/V SHUNT INTERVENTION;  Surgeon: Magda Debby SAILOR, MD;  Location: [facility];  Service: Cardiovascular;  Laterality: N/A;   AV FISTULA PLACEMENT Left 05/24/2021   Procedure: LEFT ARM BRACHIOCEPHALIC ARTERIOVENOUS (AV) FISTULA CREATION;  Surgeon: Gretta Lonni PARAS, MD;  Location: [facility];  Service: Vascular;  Laterality: Left;   BASCILIC VEIN TRANSPOSITION Left 07/09/2022   Procedure: LEFT ARM  ARTERIOVENOUS FISTULA  CREATION;  Surgeon: Serene Gaile ORN, MD;  Location: [facility];   Service: Vascular;  Laterality: Left;   BASCILIC VEIN TRANSPOSITION Left 09/19/2022   Procedure: SECOND STAGE LEFT BASILIC VEIN TRANSPOSITION;  Surgeon: Serene Gaile ORN, MD;  Location: [facility];  Service: Vascular;  Laterality: Left;   BUNIONECTOMY WITH HAMMERTOE RECONSTRUCTION Bilateral 1980's; 1999; 2009    left; right; left 'w/plates and screws   COLONOSCOPY  2016   [facility] of colon cancer   COLONOSCOPY WITH PROPOFOL  N/A 01/23/2023   Procedure: COLONOSCOPY WITH PROPOFOL ;  Surgeon: Rollin Dover, MD;  Location: [facility];  Service: Gastroenterology;  Laterality: N/A;   DILATION AND CURETTAGE OF UTERUS  early 1980's   for missed AB   FISTULA SUPERFICIALIZATION Left 09/27/2021   Procedure: LEFT ARM ARTERIOVENOUS FISTULA REVISION AND SUPERFICIALIZATION;  Surgeon: Gretta Lonni PARAS, MD;  Location: [facility];  Service: Vascular;  Laterality: Left;   LAPAROSCOPIC GASTRIC BYPASS  2007   LAPAROSCOPIC OVARIAN CYSTECTOMY Right 1985   POLYPECTOMY  01/23/2023   Procedure: POLYPECTOMY;  Surgeon: Rollin Dover, MD;  Location: [facility];  Service: Gastroenterology;;   REVERSE SHOULDER ARTHROPLASTY Left 11/22/2015   Procedure: LEFT REVERSE SHOULDER ARTHROPLASTY;  Surgeon: Franky Pointer, MD;  Location: [facility];  Service: Orthopedics;  Laterality: Left;  REVERSE SHOULDER ARTHROPLASTY Right 05/13/2023   Procedure: REVERSE SHOULDER ARTHROPLASTY;  Surgeon: Cristy Bonner DASEN, MD;  Location: [facility];  Service: Orthopedics;  Laterality: Right;   ROTATOR CUFF REPAIR Right 2006   VENOUS ANGIOPLASTY  06/10/2024   Procedure: VENOUS ANGIOPLASTY;  Surgeon: Gretta Lonni PARAS, MD;  Location: [facility];  Service: Cardiovascular;;   VENOUS ANGIOPLASTY  06/27/2024   Procedure: VENOUS ANGIOPLASTY;  Surgeon: Magda Debby SAILOR, MD;  Location: [facility];  Service: Cardiovascular;;   Patient Active Problem List   Diagnosis Date Noted   S/p reverse total shoulder arthroplasty 05/14/2023   Status post reverse total arthroplasty of  right shoulder 05/13/2023   Pre-op  evaluation 11/06/2022   ESRD (end stage renal disease) (HCC) 09/19/2022   ESRD (end stage renal disease) on dialysis (HCC) 07/09/2022   Polyneuropathy due to type 2 diabetes mellitus (HCC) 11/12/2021   CKD (chronic kidney disease) stage 5, GFR less than 15 ml/min (HCC) 04/23/2021   Abnormal weight gain 04/01/2021   Colon cancer screening 04/01/2021   Family history of malignant neoplasm of gastrointestinal tract 04/01/2021   Iron deficiency anemia 04/01/2021   Stage 4 chronic kidney disease (HCC) 03/01/2020   Pain due to onychomycosis of toenails of both feet 07/05/2019   Diabetes mellitus without complication (HCC) 07/05/2019   Essential hypertension 06/29/2019   Carpal tunnel syndrome 02/06/2016   S/P shoulder replacement 11/22/2015    PCP: Dr. Camellia LITTIE Hutchinson   REFERRING PROVIDER: Dr. Lafe LITTIE Hutchinson  THERAPY DIAG:  Muscle weakness (generalized)  Other abnormalities of gait and mobility  Rationale for Evaluation and Treatment Rehabilitation  SUBJECTIVE:                                                                                                                                                                                           SUBJECTIVE STATEMENT: Pt presents for wheelchair evaluation. Pt reports she had bil shoulders replaced (L UE 2017 and R UE 2024). She is currently in dialysis 3 days a week and has fistula in L hand. Patient reports she wants to walk as much as she can but her left hip pain limits her from standing and walking which makes it difficult for her to live independently. Pt reports of moderate to severe L and R hip pain and lower back pain. Patient has hx of falls where she reports she had bil patella fractures.   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 Ladona   Heart murmur    due to rheumatic fever - Sees Dr Ladona   Hemorrhoid    History of kidney stones    History of rheumatic fever    with a heart murmer   Hypertension    Ovarian cyst 1985   removed   Pneumonia    Pre-diabetes    no meds   Psoriasis    Sleep apnea    does not use Cpap (after weight loss no longer needed)       Cardio Status:  Functional Limitations:   [x] Intact  []  Impaired      Respiratory Status:  Functional Limitations:   [] Intact  [x] Impaired   [] SOB [] COPD [] O2 Dependent ______LPM  [] Ventilator Dependent  Resp equip:                                                     Objective Measure(s): asthma  Orthotics:   [] Amputee:                                                             [] Prosthesis:      Imaging:  02/22/24 MRI of lumbar spine: IMPRESSION: 1. No acute osseous abnormality. 2. Partial-thickness undersurface tear of the left hamstring tendon origin, predominantly involving the conjoint tendon. 3. Partial-thickness insertional tear of the left gluteus minimus cuff insertion with tendinosis and chronic background tensile changes. 4. Mild narrowing  of the left ischiofemoral space with edema of the traversing quadratus femoris muscle, which can be seen in the setting of ischiofemoral impingement. 5. Mild degenerative changes of the left hip. 6. Partial-thickness undersurface tear of the right conjoint hamstring tendon origin.  02/22/24: Xray of Lumbar spine: IMPRESSION: 1. Multilevel degenerative changes of the lumbar spine as described above. Moderate to severe spinal canal stenosis at L3-L4. 2. 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 3. Marrow heterogeneity is present. Although this can be caused by marrow infiltrative processes, the most common causes include anemia, smoking, obesity, or advancing age.  HOME ENVIRONMENT:  [x] House [] Condo/town home [] Apartment [] Asst living [] LTCF         [x] Own  [] Rent   [x] Lives alone []   Lives with others -                             Hours without assistance:   [x] Home is accessible to patient                                 Storage of wheelchair:  [x] In home   [] Other Comments:        COMMUNITY :  TRANSPORTATION:  [x] Car [] Camera operator [] Adapted w/c Lift []  Ambulance [x] Other:     Medical transportation                [] Sits in wheelchair during transport   Where is w/c stored during transport?  [] [place]  []  [employer]  r   [] Self-Driver       Drive while in  Biomedical scientist [] yes [x] no   Employment and/or school:  Specific requirements pertaining to mobility        Other:  COMMUNICATION:  Verbal Communication  [x] WFL [] receptive [] WFL [] expressive [] Understandable  [] Difficult to understand  [] non-communicative  Primary Language:____English__________ 2nd:_____________  Communication provided by:[x] Patient [] Family [] Caregiver [] Translator   [] Uses an augmentative communication device     Manufacturer/Model :                                                                MOBILITY/BALANCE:  Sitting Balance  Standing Balance  Transfers   Ambulation   [x] WFL      [] WFL  [] Independent  []  Independent   [] Uses UE for balance in sitting Comments:  [x] Uses UE/device for stability Comments:  [x]  Min assist - uses rolator []  Ambulates independently with       device:___________________      []  Mod assist  [x]  Able to ambulate __40____ feet        safely/functionally/independently   []  Min assist  []  Min assist  []  Max assist  []  Non-functional ambulator         History/High risk of falls   []  Mod assist  []  Mod assist  []  Dependent  []  Unable to ambulate   []  Max  assist  []  Max assist  Transfer method:[] 1 person [] 2 person [] sliding board [] squat pivot [x] stand pivot [] mechanical patient lift  [] other:   []  Unable  []  Unable    Fall History: # of falls in the past 6 months? 0 # of "near" falls in the past 6 months? 0    CURRENT SEATING / MOBILITY:  Current Mobility Device: [] None [x] Cane/Walker [x] Manual [] Dependent [] Dependent w/ Tilt rScooter  [] Power (type of control):   Manufacturer:  Model:  Serial #:   Size:  Color:  Age:   Purchased by whom:   Current condition of mobility base:    Current seating system:  Age of seating system:  2 months  Describe posture in present seating system:    Is the current mobility meeting medical necessity?:  [] Yes [x] No Describe: Pt has fistula in left hand, unable to propel manual wheelchair effectively due to fatigue, and pain.                                    Ability to complete Mobility-Related Activities of Daily Living (MRADL's) with Current Mobility Device:   Move room to room  [] Independent  [x] Min [] Mod [] Max assist  [] Unable  Comments: Uses manual wheelchair and walker to get from room to room,   Meal prep  [] Independent  [x] Min [] Mod [] Max assist  [] Unable    Feeding  [x] Independent  [] Min [] Mod [] Max assist  [] Unable    Bathing  [] Independent  [] Min [] Mod [] Max assist  [] Unable    Grooming   [x] Independent  [] Min [] Mod [] Max assist  [] Unable    UE dressing  [x] Independent  [] Min [] Mod [] Max assist  [] Unable    LE dressing  [x] Independent   [] Min [] Mod [] Max assist  [] Unable    Toileting  [x] Independent  [] Min [] Mod [] Max assist  [] Unable    Bowel Mgt: [x]  Continent []  Incontinent []  Accidents []  Diapers []  Colostomy []  Bowel Program:  Bladder Mgt: [x]  Continent []  Incontinent []  Accidents []  Diapers []  Urinal []  Intermittent Cath []  Indwelling Cath []  Supra-pubic Cath     Current Mobility Equipment Trialed/ Ruled Out:    Does not meet mobility needs due to:    Mark all boxes that indicate inability to use the specific equipment listed     Meets needs for safe  independent functional  ambulation  / mobility    Risk of  Falling or History of Falls    Enviromental limitations      Cognition    Safety concerns with  physical ability    Decreased / limitations endurance  & strength     Decreased / limitations  motor skills  & coordination    Pain    Pace /  Speed    Cardiac and/or  respiratory condition    Contra - indicated by diagnosis   Cane/Crutches  []   [x]   []   []   [x]   [x]   []   [x]   [x]   []   []    Walker / Rollator  []  NA   []   [x]   []   []   [x]   [x]   []   [x]   [x]   []   []     Manual Wheelchair [id number]:  []  NA  [x]   []   []   []   []   []   []   []   []   []   []    Manual W/C (K0005) with power assist  []  NA  []   []   []   []   []   []   []   []   []   []   []    Scooter  []  NA  []   []   []   []   []   []   []   []   []   []   []    Power Wheelchair: standard joystick  []  NA  []   []   []   []   []   []   []   []   []   []   []    Power Wheelchair: alternative controls  []  NA  []   []   []   []   []   []   []   []   []   []   []   Summary:  The least costly alternative for independent functional mobility was found to be:    []  Crutch/Cane  []  Walker [x]  Manual w/c  []  Manual w/c with power assist   []  Scooter   []  Power w/c std joystick   []  Power w/c alternative control        []  Requires dependent care  mobility device   Cabin crew for [employer] skills are adequate for safe mobility equipment operation  [x]   Yes []   No  Patient is willing and motivated to use recommended mobility equipment  [x]   Yes []   No       []  Patient is unable to safely operate mobility equipment independently and requires dependent care equipment Comments:           SENSATION and SKIN ISSUES:  Sensation [x]  Intact  []  Impaired []  Absent []  Hyposensate []  Hypersensate  []  Defensiveness  Location(s) of impairment:    Pressure Relief Method(s):  [x]  Lean side to side to offload (without risk of falling)  [x]   W/C push up (4+ times/hour for 15+ seconds) [x]  Stand up (without risk of falling)    []  Other: (Describe): Effective pressure relief method(s) above can be performed consistently throughout the day: [] Yes  []  No If not, Why?:  Skin Integrity Risk:       [x]  Low risk           []  Moderate risk            []  High risk  If high risk, explain:   Skin Issues/Skin Integrity  Current skin Issues  []  Yes [x]  No []  Intact  []   Red area   []   Open area  []  Scar tissue  []  At risk from prolonged sitting  Where: History of Skin Issues  []  Yes [x]  No Where : When: Stage: Hx of skin flap surgeries  []  Yes [x]  No Where:  When:  Pain: [x]  Yes []  No   Pain Location(s): bil knees, bil hips, bil shoulders, back Intensity scale: (0-10) : 8-9/10 How does pain interfere with mobility and/or MRADLs? - difficulty with prolonged standing, walking        MAT EVALUATION:  Neuro-Muscular Status: (Tone, Reflexive, Responses, etc.)     [x]   Intact   []  Spasticity:  []  Hypotonicity  []  Fluctuating  []  Muscle Spasms  []  Poor Righting Reactions/Poor Equilibrium Reactions  []  Primal Reflex(s):    Comments:            COMMENTS:    POSTURE:     Comments:  Pelvis Anterior/Posterior:  [x]  Neutral   []  Posterior  []  Anterior  []  Fixed - No movement []  Tendency away from  neutral []  Flexible []  Self-correction []  External correction Obliquity (viewed from front)  [x]  WFL []  R Obliquity []  L Obliquity  []  Fixed - No movement []  Tendency away from neutral []  Flexible []  Self-correction []  External correction Rotation  [x]  WFL []  R anterior []  L anterior  []  Fixed - No movement []  Tendency away from neutral []  Flexible []  Self-correction []  External correction Tonal Influence Pelvis:  [x]  Normal []  Flaccid []  Low tone []  Spasticity []  Dystonia []  Pelvis thrust []  Other:    Trunk Anterior/Posterior:  [x]  WFL []  Thoracic kyphosis []  Lumbar lordosis  []  Fixed - No movement []  Tendency away from neutral []  Flexible []  Self-correction []  External correction  [x]  WFL []  Convex to left  []  Convex to right []   S-curve   []  C-curve []  Multiple curves []  Tendency away from neutral []  Flexible []  Self-correction []  External correction Rotation of shoulders and upper trunk:  [x]  Neutral []  Left-anterior []  Right- anterior []  Fixed- no movement []  Tendency away from neutral []  Flexible []  Self correction []  External correction Tonal influence Trunk:  [x]  Normal []  Flaccid []  Low tone []  Spasticity []  Dystonia []  Other:   Head & Neck  [x]  Functional []  Flexed    []  Extended []  Rotated right  []  Rotated left []  Laterally flexed right []  Laterally flexed left []  Cervical hyperextension   [x]  Good head control []  Adequate head control []  Limited head control []  Absent head control Describe tone/movement of head and neck:      Lower Extremity Measurements: LE ROM:  Active ROM Right 07/13/2024 Left 07/13/2024  Hip flexion    Hip extension    Hip abduction    Hip adduction    Knee flexion    Knee extension    Ankle dorsiflexion    Ankle plantarflexion     (Blank rows = not tested)  LE MMT:  MMT Right 07/13/2024 Left 07/13/2024  Hip flexion 3+ 3+  Hip extension    Hip abduction 3+ 3+  Hip adduction 3+ 3+   Knee flexion 3+ 3+  Knee extension 3+ 3+  Ankle dorsiflexion    Ankle plantarflexion     (Blank rows = not tested)  Hip positions:  [x]  Neutral   []  Abducted   []  Adducted  []  Subluxed   []  Dislocated   []  Fixed   []  Tendency away from neutral []  Flexible []  Self-correction []  External correction   Hip Windswept:[x]  Neutral  []  Right    []  Left  []  Subluxed   []  Dislocated   []  Fixed   []  Tendency away from neutral []  Flexible []  Self-correction []  External correction  LE Tone: [x]  Normal []  Low tone []  Spasticity []  Flaccid []  Dystonia []  Rocks/Extends at hip []  Thrust into knee extension []  Pushes legs downward into footrest  Foot positioning: ROM Concerns: Dorsiflexed: []  Right   []  Left Plantar flexed: []  Right    []  Left Inversion: []  Right    []  Left Eversion: []  Right    []  Left  LE Edema: []  1+ (Barely detectable impression when finger is pressed into skin) [x]  2+ (slight indentation. 15 seconds to rebound) []  3+ (deeper indentation. 30 seconds to rebound) []  4+ (>30 seconds to rebound)  UE Measurements:  UPPER EXTREMITY ROM:   Active ROM Right 07/13/2024 Left 07/13/2024  Shoulder flexion    Shoulder abduction    Shoulder adduction    Elbow flexion    Elbow extension    Wrist flexion    Wrist extension    (Blank rows = not tested)  UPPER EXTREMITY MMT:  MMT Right 07/13/2024 Left 07/13/2024  Shoulder flexion    Shoulder abduction    Shoulder adduction    Elbow flexion    Elbow extension    Wrist flexion    Wrist extension    Pinch strength    Grip strength    (Blank rows = not tested)  Shoulder Posture:  Right Tendency towards Left  [x]   Functional [x]    []   Elevation []    []   Depression []    []   Protraction []    []   Retraction []    []   Internal rotation []    []   External rotation []    []   Subluxed []   UE Tone: [x]  Normal []  Flaccid []  Low tone []  Spasticity  []  Dystonia []  Other:   UE Edema: [x]  1+ (Barely  detectable impression when finger is pressed into skin) []  2+ (slight indentation. 15 seconds to rebound) []  3+ (deeper indentation. 30 seconds to rebound) []  4+ (>30 seconds to rebound)  Wrist/Hand: Handedness: [x]  Right   []  Left   []  NA: Comments:  Right  Left  [x]   WNL [x]    []   Limitations []    []   Contractures []    []   Fisting []    []   Tremors []    []   Weak grasp []    []   Poor dexterity []    []   Hand movement non functional []    []   Paralysis []         MOBILITY BASE RECOMMENDATIONS and JUSTIFICATION:  MOBILITY BASE  JUSTIFICATION   Manufacturer:   [employer] Mobility Model:               Cat 5VX               Color:  Seat Width:  20 Seat Depth 18   [x]  Manual mobility base (continue below)   []  Scooter/POV  []  Power mobility base   Number of hours per day spent in above selected mobility base: 8-10  Typical daily mobility base use Schedule: Intermittently throughtout the day or when she gets tired, or when she has to cook, clean   []  is not a safe, functional ambulator  [x]  limitation prevents from completing a MRADL(s) within a reasonable time frame    [x]  limitation places at high risk of morbidity or mortality secondary to  the attempts to perform a    MRADL(s)  []  limitation prevents accomplishing a MRADL(s) entirely  [x]  provide independent mobility  [x]  equipment is a lifetime medical need  [x]  walker or cane inadequate  []  any type manual wheelchair      inadequate  [x]  scooter/POV inadequate      []  requires dependent mobility          MANUAL MOBILITY      []  Standard manual wheelchair  K0001      Arm:    []  both []  right  []  left      Foot:   []  both []  right   []  left  []  self-propels wheelchair  []  will use on regular basis  []  chair fits throughout home  []  willing and motivated to use  []  propels with assistance     []  dependent use   []  Standard hemi-manual wheelchair  K0002      Arm:    []  both []  right  []  left      Foot:   []  both []   right   []  left  []  lower seat height required to foot propel  []  short stature  []  self-propels wheelchair  []  will use on regular basis  []  chair fits throughout home  []  willing and motivated to use   []  propels with assistance  []  dependent use   []  Lightweight manual wheelchair  K0003      Arm:    []  both []  right  []  left      Foot:   []  both  []  right  []  left                   []  hemi height required  []  medical condition and weight of  wheelchair affect ability to self  propel standard manual wheelchair in the residence  []  can and does self-propel (marginal propulsion skills)  []  daily use _________hours  []  chair fits throughout home  []  willing and motivated to use  []  lower seat height required to foot propel  []  short stature   []  High strength lightweight manual  wheelchair (Breezy Ultra 4)  K0004     Arm:    []  both []  right  []  left     Foot:   []  both []  right   []  left                                                                  []  hemi height required []  medical condition and weight of wheelchair affect ability to self propel while engaging in frequent MRADL(s) that cannot be performed in a standard or lightweight manual wheelchair  []  daily use _________hours  []  chair fits throughout home  []  willing and motivated to use  []  prevent repetitive use injuries   []  lower seat height required to foot propel  []  short stature    [x]  Ultra-lightweight manual wheelchair  K0005     Arm:    [x]  both []  right  []  left     Foot:   [x]  both []  right  []  left       []  hemi height required  []  heavy duty    Front seat to floor __18.5___ inches      Rear seat to floor __17.5___ inches      Back height ____18.5_ inches     Back angle __8____ degrees bend     Front angle __70___ degrees  [x]   full-time manual wheelchair user  [x]  Requires individualized fitting and optimal adjustments for multiple features that include adjustable axle configuration, fully  adjustable center of gravity, wheel camber, seat and back angle, angle of seat slope, which cannot be accommodated by a K0001 through K0004 manual wheelchair  [x]  prevent repetitive use injuries  [x]  daily use_____8-10____hours   []  user has high activity patterns that frequently require  them  to go out into the community for the purpose of independently accomplishing high level MRADL activities. Examples of these might include a combination of; shopping, work, school, Photographer, childcare, independently loading and unloading from a vehicle etc.  [x]  lower seat height required to foot propel  [x]  short stature  []  heavy duty -  weight over 250lbs   []  Current chair is a K0005   manufacture:___________________  model:_________________  serial#____________________  age:_________    [x]  First time [id number] user (complete trial)  K0003 time and # of strokes to propel 30 feet: _____55___seconds _____50____strokes - pt used bil UE and bil LE to propel the wheelchair K0005 time and # of strokes to propel 30 feet: ________seconds _________strokes  What was the result of the trial between the K0004 and K0005 manual wheelchair? ___    What features of the K0005 w/c are needed as compared to the K0004 base? Why?___    [x]  adjustable seat and back angle changes the angle of seat slope of the frame to attain a gravity assisted position for efficient propulsion and proper weight distribution along the frame     [x]  the front of the wheelchair will  be configured higher than the back of the chair to allow gravity to assist the user with postural stability  [x]  the center of the wheel will be positioned for stability, safety and efficient propulsion  [x]  adjustable axle allows for vertical, horizontal, camber and overall width changes  throughout the wheels for adjustment of the client's exact needs and abilities.   [x]  adjustable axle increases the stability and function of the chair allowing for adjustment  of the center of gravity.   [x]  accommodates the client's anatomical position in the chair maximizing independence in mobility and maneuverability in all environments.   [x]  create a minimal fixed tilt-in space to assist in positioning.   [x]  Describe users full-time manual wheelchair activity patterns:___    []  Power assist Comments:  []  prevent repetitive use injuries  []  repetitive strain injury present in    shoulder girdle    []  shoulder pain is (> or =) to 7/10     during manual propulsion       Current Pain _____/10  []  requires conservation of energy to participate in MRADL(s) runable to propel up ramps or curbs using manual wheelchair  []  been K0005 user greater than one year  []  user unwilling to use power      wheelchair (reason): []  less expensive option to power   wheelchair   []  rim activated power assist -      decreased strength   []  Heavy duty manual wheelchair       K0006     Arm:    []  both []  right  []  left     Foot:   []  both []  right  []  left     []  hemi height required    []  Dependent base  []  user exceeds 250lbs  []  non-functional ambulator    []  extreme spasticity  []  over active movement   []  broken frame/hx of repeated     repairs  []  able to self-propel in residence       []  lower seat to floor height required  []  unable to self-propel in residence   []  Extra heavy duty manual wheelchair  K0007     Arm:    []  both []  right  []  left     Foot:   []  both []  right  []  left     []  hemi height required  []  Dependent base  []  user exceeds 300lbs  []  non-functional ambulator    []  able to self-propel in residence   []  lower seat to floor height required  []  unable to self-propel in residence     []  Manual wheelchair with tilt [phone]      (Manual "Tilt-n-Space")  []  patient is dependent for transfers  []  patient requires frequent       positioning for pressure relief   []  patient requires frequent      positioning for poor/absent trunk control        []   Stroller Base  []  infant/child   []  unable to propel manual      wheelchair  []  allows for growth  []  non-functional ambulator  []  non-functional UE  []  independent mobility is not a goal at this time    MANUAL FRAME OPTIONS      Push handles  []  extended   []  angle adjustable   [x]  standard  []  caregiver access  []  caregiver assist    []  allows "hooking" to enable      increased  ability to perform ADLs or maintain balance   []  Angle Adjustable Back  []  postural control  []  control of tone/spasticity  []  accommodation of range of motion  []  UE functional control  []  accommodation for seating system    Rear wheel placement  []  std/fixed  [x] fully adjustableramputee   [x]  camber ___0_____degree  [x]  removable rear wheel  []  non-removable rear wheel  Wheel size __24_____  Wheel style_______mag________________  []  improved UE access to wheels  []  increase propulsion ability  []  improved stability  []  changing angle in space for      improvement of postural stability  []  remove for transport    []  allow for seating system to fit on  base  []  amputee placement  []  1-arm drive access   r R  r L  []  enable propulsion of manual       wheelchair with one arm    []  amputee placement   Wheel rims/ Hand rims  [x]  Standard   short tab hand rims []  Specialized-____ []  provide ability to propel manual   []  increase self-propulsion with hand wheelchair weakness/decreased grasp     []  Spoke protector/guard   []  prevent hands from getting caught in spokes   Tires:  []  pneumatic  []  flat free inserts  [x]  solid  Style:  [x]  decrease roll resistance              [x]  prevent frequent flats  []  increase shock absorbency  [x]  decrease maintenance   []  decrease pain from road shock    []  decrease spasms from road shock    Wheel Locks:    [x]  push []  pull []  scissor  [x]  lock wheels for transfers  [x]  lock wheels from rolling   Brake/wheel lock extension:  []  R  []  L  []  allow user to  operate wheel locks due to decreased reach or strength   Caster housing: adjustable Caster size:        6              Style:          semi pneumatic                                []  suspension fork  [x]  maneuverability   [x]  stability of wheelchair   [x]  durability  [x]  maintenance  [x]  angle adjustment for posture  [x]  allow for feet to come under        wheelchair base  [x]  allows change in seat to floor  height   [x]  increase shock absorbency  [x]  decrease pain from road shock  [x]  decrease spasms from road    shock   []  Side guards  []  prevent clothing getting caught in wheel or becoming soiled   [] provide hip and pelvic stability  []  eliminates contact between body and wheels  []  limit hand contact with wheels   [x]  Anti-tippers      [x]  prevent wheelchair from tipping    backward  []  assist caregiver with curbs     POWER MOBILITY      []  Scooter/POV    []  can safely operate   []  can safely transfer   []  has adequate trunk stability   []  cannot functionally propel  manual wheelchair    []  Power mobility base    []  non-ambulatory   []  cannot functionally propel manual wheelchair   []   cannot functionally and safely      operate scooter/POV  []  can safely operate power       wheelchair  []  home is accessible  []  willing to use power wheelchair     Tilt  []  Powered tilt on powered chair  []  Powered tilt on manual chair  []  Manual tilt on manual chair Comments:  []  change position for pressure      []  elief/cannot weight shift   []  change position against      gravitational force on head and      shoulders   []  decrease pain  []  blood pressure management   []  control autonomic dysreflexia  []  decrease respiratory distress  []  management of spasticity  []  management of low tone  []  facilitate postural control   []  rest periods   []  control edema  []  increase sitting tolerance   []  aid with transfers     Recline   []  Power recline on power chair  []  Manual  recline on manual chair  Comments:    []  intermittent catheterization  []  manage spasticity  []  accommodate femur to back angle  []  change position for pressure relief/cannot weight shift rhigh risk of pressure sore development  []  tilt alone does not accomplish     effective pressure relief, maximum pressure relief achieved at -      _______ degrees tilt   _______ degrees recline   []  difficult to transfer to and from bed []  rest periods and sleeping in chair  []  repositioning for transfers  []  bring to full recline for ADL care  []  clothing/diaper changes in chair  []  gravity PEG tube feeding  []  head positioning  []  decrease pain  []  blood pressure management   []  control autonomic dysreflexia  []  decrease respiratory distress  []  user on ventilator     Elevator on mobility base  []  Power wheelchair  []  Scooter  []  increase Indep in transfers   []  increase Indep in ADLs    []  bathroom function and safety  []  kitchen/cooking function and safety  []  shopping  []  raise height for communication at standing level  []  raise height for eye contact which reduces cervical neck strain and pain  []  drive at raised height for safety and navigating crowds  []  Other:   []  Vertical position system  (anterior tilt)     (Drive locks-out)    []  Stand       (Drive enabled)  []  independent weight bearing  []  decrease joint contractures  []  decrease/manage spasticity  []  decrease/manage spasms  []  pressure distribution away from   scapula, sacrum, coccyx, and ischial tuberosity  []  increase digestion and elimination   []  access to counters and cabinets  []  increase reach  []  increase interaction with others at eye level, reduces neck strain  []  increase performance of       MRADL(s)      Power elevating legrest    []  Center mount (Single) 85-170 degrees       []  Standard (Pair) 100-170 degrees  []  position legs at 90 degrees, not available with std power ELR  []  center mount tucks  into chair to decrease turning radius in home, not available with std power ELR  []  provide change in position for LE  []  elevate legs during recline    []  maintain placement of feet on      footplate  []  decrease edema  []  improve circulation  []   actuator needed to elevate legrest  []  actuator needed to articulate legrest preventing knees from flexing  []  Increase ground clearance over      curbs  []   STD (pair) independently                     elevate legrest   POWER WHEELCHAIR CONTROLS      Controls/input device  []  Expandable  []  Non-expandable  []  Proportional  []  Right Hand []  Left Hand  []  Non-proportional/switches/head-array  []  Electrical/proximity         []   Mechanical      Manufacturer:___________________   Type:________________________ []  provides access for controlling wheelchair  []  programming for accurate control  []  progressive disease/changing condition  []  required for alternative drive      controls       []  lacks motor control to operate  proportional drive control  []  unable to understand proportional controls  []  limited movement/strength  []  extraneous movement / tremors / ataxic / spastic       []  Upgraded electronics controller/harness    []  Single power (tilt or recline)   []  Expandable    []  Non-expandable plus   []  Multi-power (tilt, recline, power legrest, power seat lift, vertical positioning system, stand)  []  allows input device to communicate with drive motors  []  harness provides necessary connections between the controller, input device, and seat functions     []  needed in order to operate power seat functions through joystick/ input device  []  required for alternative drive controls     []  Enhanced display  []  required to connect all alternative drive controls   []  required for upgraded joystick      (lite-throw, heavy duty, micro)  []  Allows user to see in which mode and drive the wheelchair is set; necessary for alternate controls        []  Upgraded tracking electronics  []  correct tracking when on uneven surfaces makes switch driving more efficient and less fatiguing  []  increase safety when driving  []  increase ability to traverse thresholds    []  Safety / reset / mode switches     Type:    []  Used to change modes and stop the wheelchair when driving     []  Mount for joystick / input device/switches  []  swing away for access or transfers   []  attaches joystick / input device / switches to wheelchair   []  provides for consistent access  []  midline for optimal placement    []  Attendant controlled joystick plus     mount  []  safety  []  long distance driving  []  operation of seat functions  []  compliance with transportation regulations    []  Battery  []  required to power (power assist / scooter/ power wc / other):   []  Power inverter (24V to 12V)  []  required for ventilator / respiratory equipment / other:     CHAIR OPTIONS MANUAL & POWER      Armrests   [x]  adjustable height []  removable  []  swing away []  fixed  [x]  flip back  []  reclining  [x]  full length pads []  desk []  tube arms []  gel pads  [x]  provide support with elbow at 90    [x]  remove/flip back/swing away for  transfers  [x]  provide support and positioning of upper body    [x]  allow to come closer to table top  []  remove for access to tables  []  provide support for w/c  tray  [x]  change of height/angles for variable activities   []  Elbow support / Elbow stop  []  keep elbow positioned on arm pad  []  keep arms from falling off arm pad  during tilt and/or recline   Upper Extremity Support  []  Arm trough  []   R  []   L  Style:  []  swivel mount []  fixed mount   []  posterior hand support  []   tray  []  full tray  []  joystick cut out  []   R  []   L  Style:  []  decrease gravitational pull on      shoulders  []  provide support to increase UE  function  []  provide hand support in natural    position  []  position flaccid UE  []  decrease subluxation    []   decrease edema       []  manage spasticity   []  provide midline positioning  []  provide work surface  []  placement for AAC/ Computer/ EADL       Hangers/ Legrests   [x]  ___70___ degree  []  Elevating []  articulating  [x]  swing away []  fixed []  lift off  []  heavy duty  []  adjustable knee angle  []  adjustable calf panel   []  longer extension tube              [x]  provide LE support  [x]  maintain placement of feet on      footplate   []  accommodate lower leg length  []  accommodate to hamstring       tightness  []  enable transfers  []  provide change in position for LE's  []  elevate legs during recline    []  decrease edema  []  durability      Foot support   [x]  footplate []  R []  L [x]  flip up           []  Depth adjustable   []  angle adjustable  []  foot board/one piece    [x]  provide foot support  []  accommodate to ankle ROM  [x]  allow foot to go under wheelchair base  []  enable transfers     []  Shoe holders  []  position foot    []  decrease / manage spasticity  []  control position of LE  []  stability    []  safety     [x]  Ankle strap/heel      loops  [x]  support foot on foot support  []  decrease extraneous movement  []  provide input to heel   []  protect foot     []  Amputee adapter []  R  []  L     Style:                  Size:  []  Provide support for stump/residual extremity    []  Transportation tie-down  []  to provide crash tested tie-down brackets    []  Crutch/cane holder    []  O2 holder    []  IV hanger   []  Ventilator tray/mount    []  stabilize accessory on wheelchair       Component  Justification     [x]  Seat cushion - general use     []  accommodate impaired sensation  []  decubitus ulcers present or history  []  unable to shift weight  []  increase pressure distribution  []  prevent pelvic extension  []  custom required "off-the-shelf"    seat cushion will not accommodate deformity  []  stabilize/promote pelvis alignment  []  stabilize/promote femur alignment  []   accommodate obliquity  []  accommodate multiple deformity  []   incontinent/accidents  [x]  low maintenance     []  seat mounts                 []  fixed []  removable  []  attach seat platform/cushion to wheelchair frame    []  Seat wedge    []  provide increased aggressiveness of seat shape to decrease sliding  down in the seat  []  accommodate ROM        []  Cover replacement   []  protect back or seat cushion  []  incontinent/accidents    []  Solid seat / insert    []  support cushion to prevent      hammocking  []  allows attachment of cushion to mobility base    []  Lateral pelvic/thigh/hip     support (Guides)     []  decrease abduction  []  accommodate pelvis  []  position upper legs  []  accommodate spasticity  []  removable for transfers     []  Lateral pelvic/thigh      supports mounts  []  fixed   []  swing-away   []  removable  []  mounts lateral pelvic/thigh supports     []  mounts lateral pelvic/thigh supports swing-away or removable for transfers    []  Medial thigh support (Pommel)  [] decrease adduction  [] accommodate ROM  []  remove for transfers   []  alignment      []  Medial thigh   []  fixed      support mounts      []  swing-away   []  removable  []  mounts medial thigh supports   []  Mounts medial supports swing- away or removable for transfers       Component  Justification   [x]  Back  General use     [x]  provide posterior trunk support []  facilitate tone  []  provide lumbar/sacral support []  accommodate deformity  []  support trunk in midline   []  custom required "off-the-shelf" back support will not accommodate deformity   []  provide lateral trunk support []  accommodate or decrease tone            []  Back mounts  []  fixed  []  removable  []  attach back rest/cushion to wheelchair frame   []  Lateral trunk      supports  []  R []  L  []  decrease lateral trunk leaning  []  accommodate asymmetry    []  contour for increased contact  []  safety    []  control of tone    []  Lateral trunk       supports mounts  []  fixed  []  swing-away   []  removable  []  mounts lateral trunk supports     []  Mounts lateral trunk supports swing-away or removable for transfers   []  Anterior chest      strap, vest     []  decrease forward movement of shoulder  []  decrease forward movement of trunk  []  safety/stability  []  added abdominal support  []  trunk alignment  []  assistance with shoulder control   []  decrease shoulder elevation    []  Headrest      []  provide posterior head support  []  provide posterior neck support  []  provide lateral head support  []  provide anterior head support  []  support during tilt and recline  []  improve feeding     []  improve respiration  []  placement of switches  []  safety    []  accommodate ROM   []  accommodate tone  []  improve visual orientation   []  Headrest           []  fixed []   removable []  flip down      Mounting hardware   []  swing-away laterals/switches  []  mount headrest   []  mounts headrest flip down or  removable for transfers  []  mount headrest swing-away laterals   []  mount switches     []  Neck Support    []  decrease neck rotation  []  decrease forward neck flexion   Pelvic Positioner    []  std hip belt          []  padded hip belt  []  dual pull hip belt  []  four point hip belt  []  stabilize tone  []  decrease falling out of chair  []  prevent excessive extension  []  special pull angle to control      rotation  []  pad for protection over boney   prominence  []  promote comfort    []  Essential needs        bag/pouch   []  medicines []  special food rorthotics []  clothing changes  []  diapers  []  catheter/hygiene []  ostomy supplies   The above equipment has a life- long use expectancy.  Growth and changes in medical and/or functional conditions would be the exceptions.   SUMMARY:   Several options were considered and discussed with patient. - Pt initially wanted for scooter but it was discussed with her that due to lack of maneuverability,  considering its weight and need to assemble and disassemble it and lift heavy parts, it may not be most appropriate device for the patient. - Group 2 power chair was recommended but patient doesn't want to rely on power mobility just yet and wants to be able to use her UE and LE as much as she can. She also has concerns with power chair not being able to fit through certain doors in her house. - We discussed K5 chair may be her best option that will fit her medical necessarily because of its light weight, ease with maneuverability and being able to take wheels off to load it I the car more independently if required. It will also help her conserve energy expenditure and improve independence.  - Pt has walker with spring loaded glide stops. Walker doesn't have any glides but has rubber tips on back legs. Walker was causing extra drag resistance during walking impacting patient's ease of use with walker. Patient wanted to put tennis balls on the walker. With patient's permission, glide stops were tapped to eliminate their function and two tennis balls were placed on back legs for the patient.    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 She was evaluated in our clinic for mobility needs and wheelchair assessment.  Functional Assessment Findings:  Timed Up and Go (TUG) Test: 57 seconds with a rolling walker - indicating significantly decreased mobility and a high risk for falls. 10-Meter Walk Test: 0.14 m/s with a rolling walker - classifying her as a  household ambulator with high fall risk (threshold <0.50 m/s).  Clinical Justification: The patient currently uses a K3 manual wheelchair but reports difficulty with prolonged use due to its weight and  limited maneuverability, resulting in fatigue and reduced independence. She is motivated to remain active and ambulatory, and has declined power mobility options after being educated on their limitations, including:  Difficulty maneuvering scooters indoors due to turning radius. Inability to safely transport scooters or folding power chairs due to component weight (~30 lbs each) and lack of tie-downs for medical transport. Group 2 power chairs were discussed, but the patient prefers to maintain upper body strength and mobility through manual propulsion.  A K5 ultra-lightweight manual wheelchair is medically necessary because:  It is significantly lighter than a K3 chair, reducing energy expenditure and fatigue. It offers improved maneuverability, allowing the patient to navigate her home and community more safely and independently. It folds easily for transport, supporting her need to attend medical appointments and dialysis sessions. It will support her functional goals, including maintaining independence and preparing for potential total knee arthroplasty (TKA), which may improve her transfer ability and ambulation.  Conclusion: Given her medical complexity, high fall risk, and current functional limitations, a K5 manual wheelchair is the most appropriate and medically necessary mobility device. It will enhance her safety, independence, and quality of life while supporting her rehabilitation goals.   OBJECTIVE IMPAIRMENTS Abnormal gait, decreased activity tolerance, decreased balance, decreased endurance, decreased mobility, difficulty walking, decreased strength, increased edema, and pain.   ACTIVITY LIMITATIONS carrying, lifting, bending, standing, squatting, stairs, and  transfers  PARTICIPATION LIMITATIONS: meal prep, cleaning, laundry, shopping, community activity, and yard work  PERSONAL FACTORS Time since onset of injury/illness/exacerbation and 3+ comorbidities: ESRD on dialysis, DM, L hip pain, asthma are also affecting patient's functional outcome.   REHAB POTENTIAL: Good  CLINICAL DECISION MAKING: Stable/uncomplicated  EVALUATION COMPLEXITY: Moderate                                   GOALS: One time visit. No goals established.    PLAN: PT FREQUENCY: one time visit    Raj LOISE Blanch, PT 07/13/2024, 9:28 AM    I concur with the above findings and recommendations of the therapist:  Physician name printed:         Physician's signature:      Date:

## 2024-07-19 ENCOUNTER — Telehealth: Payer: Self-pay | Admitting: *Deleted

## 2024-07-19 NOTE — Telephone Encounter (Signed)
   Pre-operative Risk Assessment    Patient Name: Marieanne Marxen  DOB: Apr 29, 1951 MRN: [id number]   Date of last office visit: NONE Date of next office visit: NONE- PT WILL NEED NEW PT APPT FOR PREOP CLEARANCE   Request for Surgical Clearance    Procedure:  RIGHT TOTAL KNEE ARTHROPLASTY  Date of Surgery:  Clearance TBD                                Surgeon:  DR. EVALENE CHANCY  Surgeon's Group or Practice Name:  [facility] Phone number:  [phone] EXT [address] Fax number:  [phone]   Type of Clearance Requested:   - Medical ; NONE INDICATED ON FORM TO BE HELD   Type of Anesthesia:  CHOICE   Additional requests/questions:    Bonney Niels Jest   07/19/2024, 10:55 AM

## 2024-07-19 NOTE — Telephone Encounter (Signed)
   Name: Colleen Gray  DOB: 02-22-1951  MRN: [id number]  Primary Cardiologist: None  Chart reviewed as part of pre-operative protocol coverage. Because of [name]'s past medical history and time since last visit, she will require a follow-up in-office visit in order to better assess preoperative cardiovascular risk. Needs new patient visit.   Pre-op  covering staff: - Please schedule appointment and call patient to inform them. If patient already had an upcoming appointment within acceptable timeframe, please add pre-op  clearance to the appointment notes so provider is aware. - Please contact requesting surgeon's office via preferred method (i.e, phone, fax) to inform them of need for appointment prior to surgery.  Barnie Hila, NP  07/19/2024, 12:51 PM

## 2024-07-19 NOTE — Telephone Encounter (Signed)
 1st attempt to reach pt regarding surgical clearance and the need for an IN OFFICE appointment.  Left pt a detailed message to call back and get that scheduled.  *PT WILL NEED NEW PT APPT FOR PREOP CLEARANCE *

## 2024-07-19 NOTE — Telephone Encounter (Signed)
 Spoke w/ patient - she is scheduled with Dr. Swaz[place] on 10/10. She is on the cancellation list for a sooner appt to get cleared.

## 2024-07-20 ENCOUNTER — Telehealth: Payer: Self-pay

## 2024-07-20 ENCOUNTER — Institutional Professional Consult (permissible substitution) (INDEPENDENT_AMBULATORY_CARE_PROVIDER_SITE_OTHER): Admitting: Family Medicine

## 2024-07-20 NOTE — Telephone Encounter (Signed)
 ERROR

## 2024-07-20 NOTE — Telephone Encounter (Signed)
 Surgery scheduler sent me a secure chat and said pt asked her if we might be able to move up her new pt appt with cardiology. I stated I could get her in to see Hao Meng, [facility] 07/29/24. I called the pt and she was acceptable of new pt appt 07/29/24 with Hao Meng, PAC, under the new pt preop protocol.

## 2024-07-29 ENCOUNTER — Ambulatory Visit: Attending: Physician Assistant | Admitting: Physician Assistant

## 2024-07-29 ENCOUNTER — Encounter: Payer: Self-pay | Admitting: Physician Assistant

## 2024-07-29 VITALS — BP 92/50 | HR 76 | Ht 62.0 in | Wt 199.0 lb

## 2024-07-29 DIAGNOSIS — Z01818 Encounter for other preprocedural examination: Secondary | ICD-10-CM | POA: Insufficient documentation

## 2024-07-29 DIAGNOSIS — I10 Essential (primary) hypertension: Secondary | ICD-10-CM | POA: Insufficient documentation

## 2024-07-29 DIAGNOSIS — N186 End stage renal disease: Secondary | ICD-10-CM | POA: Insufficient documentation

## 2024-07-29 MED ORDER — FUROSEMIDE 80 MG PO TABS: 80.0000 mg | ORAL_TABLET | ORAL | 3 refills | Status: AC

## 2024-07-29 NOTE — Progress Notes (Signed)
 Cardiology Office Note   Date:  07/29/2024  ID:  Kalijah, Westfall October 08, 1951, MRN [id number] PCP: Addie Camellia CROME, MD  [facility] Providers Cardiologist:  [facility] - DOD Dr. Verlin  History of Present Illness Brittani Purdum is a 73 y.o. female with past medical history of hypertension, ESRD on dialysis and history of heart murmur.  Patient was previously established with Dr. Dewane of [facility] heart and vascular in November 2023.  At the time, she was planning to undergo surgery and had required cardiac clearance.  Echocardiogram and stress test was ordered.  Echocardiogram performed on 11/18/2022 showed EF 60 to 65%, mild concentric LVH, grade 1 DD, mild MR.  Subsequent Lexiscan  Myoview obtained on 12/17/2022 showed soft tissue attenuation artifact noted on the inferior wall, superimposed on this there was a reversible moderate size defect in the basal and mid inferior region, normal TID index, LV was mildly dilated both at rest and on the stress image.  Stress LVEF low normal at 52%.  Overall considered intermediate risk study.  She has not been followed by cardiology service since.  She had positive flank stool and underwent colonoscopy by Dr. Rollin in February 2024 that showed three 2 to 4 mm polyp in the sigmoid colon, transverse colon in the cecum, the polyps were removed by cold snare.  There was diverticulosis in the sigmoid colon and descending colon as well plan for repeat colonoscopy in 5 years.  She underwent right reverse total shoulder arthroplasty by Dr. Cristy in May 2024.  Patient was last seen by Dr. Serene of vascular surgery on 06/06/2024, based on report, she previously had second stage left basilic vein fistula creation in September 2023 however had complained of left arm cooler than the right arm. Vascular surgery did not feel she has steal syndrome.  More recently, patient was taken by Dr. Gretta to [facility] lab and underwent left upper extremity fistulogram.   There was no evidence of central venous stenosis.  She has upcoming right total knee surgery by Dr. Evalene Chancy.  She presents today for cardiac clearance.  Patient presents today for heart first clinic visit and preoperative clearance prior to upcoming right total knee surgery.  She adamantly denies any chest discomfort.  She has no lower extremity edema, orthopnea or PND.  She cannot achieve more than 4 metabolic level activity due to significant knee issue.  She says she may end up needing left knee surgery after the right knee surgery in the future as well.  I reviewed her previous echocardiogram and stress test.  She has right bundle branch block on today's EKG which is chronic.  She just underwent dialysis yesterday.  She is euvolemic on exam.  Patient is not on any blood pressure medication, she does take Lasix  as she is still making urine.  I discussed her case with DOD Dr. Lonni Verlin, despite the somewhat abnormal stress test she had in December 2023, patient has been completely asymptomatic and has previously underwent shoulder surgery in May 2024 and did well.  The previous stress test abnormality could be related to underlying breast attenuation artifact.  Given lack of symptom, we did not recommend any further workup, patient is deemed at acceptable risk to proceed with upcoming orthopedic surgery without further workup.  ROS:   She denies chest pain, palpitations, dyspnea, pnd, orthopnea, n, v, dizziness, syncope, edema, weight gain, or early satiety. All other systems reviewed and are otherwise negative except as noted above.  Studies Reviewed      Cardiac Studies & Procedures   ______________________________________________________________________________________________   STRESS TESTS  PCV MYOCARDIAL PERFUSION WITH LEXISCAN  12/17/2022  Interpretation Summary Lexiscan   Nuclear stress test 12/17/2022: Non-diagnostic ECG stress. The heart rate response was  consistent with Regadenoson . Myocardial perfusion is abnormal. Soft tissue attenuation noted noted in the inferior wall.  Super imposed on this, there is a reversible moderate sized defect in the basal and mid inferior region. LV is mildly dilated in both rest and stress images. Normal TID index. Overall LV systolic function is abnormal with basal and mid inferior wall hypokinesis.  Stress LV EF low normal: 52%. No previous exam available for comparison. Intermediate risk study.   ECHOCARDIOGRAM  PCV ECHOCARDIOGRAM COMPLETE 11/18/2022  Narrative Echocardiogram 11/18/2022: Normal LV systolic function with visual EF 60-65%. Left ventricle cavity is normal in size. Mild concentric hypertrophy of the left ventricle. Normal global wall motion. Doppler evidence of grade I (impaired) diastolic dysfunction, normal LAP. Calculated EF 67%. Right ventricle cavity is slightly dilated. Normal right ventricular function. Trileaflet aortic valve with no regurgitation. Mild aortic valve leaflet thickening. Structurally normal mitral valve.  Mild (Grade I) mitral regurgitation. Structurally normal tricuspid valve with trace regurgitation. No evidence of pulmonary hypertension. Compared to 09/2017, diastolic dysfunction is now grade I (previously grade II). Otherwise, no significant change.          ______________________________________________________________________________________________      Risk Assessment/Calculations           Physical Exam VS:  BP (!) 92/50 (BP Location: Left Arm, Patient Position: Sitting, Cuff Size: Large)   Pulse 76   Ht 5' 2 (1.575 m)   Wt 199 lb (90.3 kg)   LMP 12/23/2007 (Approximate)   BMI 36.40 kg/m        Wt Readings from Last 3 Encounters:  07/29/24 199 lb (90.3 kg)  02/22/24 233 lb 11 oz (106 kg)  10/22/23 234 lb (106.1 kg)    GEN: Well nourished, well developed in no acute distress NECK: No JVD; No carotid bruits CARDIAC: RRR, no murmurs, rubs,  gallops RESPIRATORY:  Clear to auscultation without rales, wheezing or rhonchi  ABDOMEN: Soft, non-tender, non-distended EXTREMITIES:  No edema; No deformity   ASSESSMENT AND PLAN  Preoperative clearance: She has upcoming right total knee surgery.  She previously underwent Lexiscan  Myoview in December 2023 that came back somewhat abnormal, the abnormality was likely due to artifact.  She was able to undergo right shoulder surgery in May 2024 without any issue.  She denies any chest pain or shortness of breath.  She is unable to accomplish more than 4 METS of activity.  I discussed her case with DOD Dr. Verlin, given lack of any symptom, we do not recommend any ischemic workup at this time.  She is deemed at acceptable risk to proceed with upcoming right total knee surgery.  Hypertension:  both of her listed blood pressure medication has been discontinued after she was started on dialysis.  Her blood pressure borderline low today.  ESRD on dialysis Tuesday, Thursday, and Saturday.  Followed by Dr. Jerrye.  She is still taking Lasix  and still make urine.       Dispo: Patient can follow-up with the cardiology service as needed.  Signed, Scot Ford, PA

## 2024-07-29 NOTE — Patient Instructions (Signed)
 Medication Instructions:  NO CHANGES   Lab Work: NONE TO NE DONE TODAY.   Testing/Procedures: NONE  Follow-Up: At [facility], you and your health needs are our priority.  As part of our continuing mission to provide you with exceptional heart care, our providers are all part of one team.  This team includes your primary Cardiologist (physician) and Advanced Practice Providers or APPs (Physician Assistants and Nurse Practitioners) who all work together to provide you with the care you need, when you need it.  Your next appointment:   AS NEEDED  Provider:   HAO MENG, PA

## 2024-07-31 NOTE — Telephone Encounter (Signed)
 See office note on 07/29/2024 for preop clearance.  Patient has been cleared for the upcoming procedure.  I have forwarded the office note to the surgeon's office.

## 2024-08-08 ENCOUNTER — Telehealth: Payer: Self-pay | Admitting: Physician Assistant

## 2024-08-08 NOTE — Telephone Encounter (Signed)
 *  STAT* If patient is at the pharmacy, call can be transferred to refill team.   1. Which medications need to be refilled? (please list name of each medication and dose if known)   nitroGLYCERIN  (NITROSTAT ) 0.4 MG SL tablet     2. Would you like to learn more about the convenience, safety, & potential cost savings by using the [facility]? No      3. Are you open to using the [facility] (Type [facility]. ). No    4. Which pharmacy/location (including street and city if local pharmacy) is medication to be sent to? [id number] - [place], [place] - [address] AT [address] OF [address]    5. Do they need a 30 day or 90 day supply? 1 bottle

## 2024-08-09 MED ORDER — NITROGLYCERIN 0.4 MG SL SUBL: 0.4000 mg | SUBLINGUAL_TABLET | SUBLINGUAL | 3 refills | Status: AC | PRN

## 2024-08-09 NOTE — Telephone Encounter (Signed)
[facility] 

## 2024-08-12 ENCOUNTER — Encounter (HOSPITAL_COMMUNITY): Payer: Self-pay

## 2024-08-12 NOTE — Pre-Procedure Instructions (Addendum)
 Surgical Instructions   Your procedure is scheduled on August 23, 2024. Report to [facility] Main Entrance A at 10:30 A.M., then check in with the Admitting office. Any questions or running late day of surgery: call [phone]  Questions prior to your surgery date: call [phone], Monday-Friday, 8am-4pm. If you experience any cold or flu symptoms such as cough, fever, chills, shortness of breath, etc. between now and your scheduled surgery, please notify us  at the above number.     Remember:  Do not eat after midnight the night before your surgery  You may drink clear liquids until 9:30 AM the morning of your surgery.   Clear liquids allowed are: Water , Non-Citrus Juices (without pulp), Carbonated Beverages, Clear Tea (no milk, honey, etc.), Black Coffee Only (NO MILK, CREAM OR POWDERED CREAMER of any kind), and Gatorade.  Patient Instructions  The night before surgery:  No food after midnight. ONLY clear liquids after midnight  The day of surgery (if you have diabetes): Drink ONE (1) 12 oz G2 given to you in your pre admission testing appointment by 9:30 AM the morning of surgery. Drink in one sitting. Do not sip.  This drink was given to you during your hospital  pre-op  appointment visit.  Nothing else to drink after completing the  12 oz bottle of G2.         If you have questions, please contact your surgeon's office.    Take these medicines the morning of surgery with A SIP OF WATER : gabapentin  (NEURONTIN )    May take these medicines IF NEEDED: albuterol  (VENTOLIN  HFA) inhaler - please bring your inhaler with you morning of surgery nitroGLYCERIN  (NITROSTAT ) - if dose taken prior to surgery, please call [phone]   One week prior to surgery, STOP taking any Aspirin  (unless otherwise instructed by your surgeon) Aleve, Naproxen, Ibuprofen, Motrin, Advil, Goody's, BC's, all herbal medications, fish oil (Omega 3), and non-prescription vitamins.   HOW TO MANAGE  YOUR DIABETES BEFORE AND AFTER SURGERY  Why is it important to control my blood sugar before and after surgery? Improving blood sugar levels before and after surgery helps healing and can limit problems. A way of improving blood sugar control is eating a healthy diet by:  Eating less sugar and carbohydrates  Increasing activity/exercise  Talking with your doctor about reaching your blood sugar goals High blood sugars (greater than 180 mg/dL) can raise your risk of infections and slow your recovery, so you will need to focus on controlling your diabetes during the weeks before surgery. Make sure that the doctor who takes care of your diabetes knows about your planned surgery including the date and location.  How do I manage my blood sugar before surgery? Check your blood sugar at least 4 times a day, starting 2 days before surgery, to make sure that the level is not too high or low.  Check your blood sugar the morning of your surgery when you wake up and every 2 hours until you get to the Short Stay unit.  If your blood sugar is less than 70 mg/dL, you will need to treat for low blood sugar: Do not take insulin . Treat a low blood sugar (less than 70 mg/dL) with  cup of clear juice (cranberry or apple), 4 glucose tablets, OR glucose gel. Recheck blood sugar in 15 minutes after treatment (to make sure it is greater than 70 mg/dL). If your blood sugar is not greater than 70 mg/dL on recheck, call [phone] for further instructions.  Report your blood sugar to the short stay nurse when you get to Short Stay.  If you are admitted to the hospital after surgery: Your blood sugar will be checked by the staff and you will probably be given insulin  after surgery (instead of oral diabetes medicines) to make sure you have good blood sugar levels. The goal for blood sugar control after surgery is 80-180 mg/dL.                      Do NOT Smoke (Tobacco/Vaping) for 24 hours prior to your  procedure.  If you use a CPAP at night, you may bring your mask/headgear for your overnight stay.   You will be asked to remove any contacts, glasses, piercing's, hearing aid's, dentures/partials prior to surgery. Please bring cases for these items if needed.    Patients discharged the day of surgery will not be allowed to drive home, and someone needs to stay with them for 24 hours.  SURGICAL WAITING ROOM VISITATION Patients may have no more than 2 support people in the waiting area - these visitors may rotate.   Pre-op  nurse will coordinate an appropriate time for 1 ADULT support person, who may not rotate, to accompany patient in pre-op .  Children under the age of 9 must have an adult with them who is not the patient and must remain in the main waiting area with an adult.  If the patient needs to stay at the hospital during part of their recovery, the visitor guidelines for inpatient rooms apply.  Please refer to the [facility] website for the visitor guidelines for any additional information.   If you received a COVID test during your pre-op  visit  it is requested that you wear a mask when out in public, stay away from anyone that may not be feeling well and notify your surgeon if you develop symptoms. If you have been in contact with anyone that has tested positive in the last 10 days please notify you surgeon.      Pre-operative 5 CHG Bathing Instructions   You can play a key role in reducing the risk of infection after surgery. Your skin needs to be as free of germs as possible. You can reduce the number of germs on your skin by washing with CHG (chlorhexidine  gluconate) soap before surgery. CHG is an antiseptic soap that kills germs and continues to kill germs even after washing.   DO NOT use if you have an allergy to chlorhexidine /CHG or antibacterial soaps. If your skin becomes reddened or irritated, stop using the CHG and notify one of our RNs at [phone].   Please  shower with the CHG soap starting 4 days before surgery using the following schedule:     Please keep in mind the following:  DO NOT shave, including legs and underarms, starting the day of your first shower.   You may shave your face at any point before/day of surgery.  Place clean sheets on your bed the day you start using CHG soap. Use a clean washcloth (not used since being washed) for each shower. DO NOT sleep with pets once you start using the CHG.   CHG Shower Instructions:  Wash your face and private area with normal soap. If you choose to wash your hair, wash first with your normal shampoo.  After you use shampoo/soap, rinse your hair and body thoroughly to remove shampoo/soap residue.  Turn the water  OFF and apply about 3 tablespoons (45  ml) of CHG soap to a CLEAN washcloth.  Apply CHG soap ONLY FROM YOUR NECK DOWN TO YOUR TOES (washing for 3-5 minutes)  DO NOT use CHG soap on face, private areas, open wounds, or sores.  Pay special attention to the area where your surgery is being performed.  If you are having back surgery, having someone wash your back for you may be helpful. Wait 2 minutes after CHG soap is applied, then you may rinse off the CHG soap.  Pat dry with a clean towel  Put on clean clothes/pajamas   If you choose to wear lotion, please use ONLY the CHG-compatible lotions that are listed below.  Additional instructions for the day of surgery: DO NOT APPLY any lotions, deodorants, cologne, or perfumes.   Do not bring valuables to the hospital. [facility] is not responsible for any belongings/valuables. Do not wear nail polish, gel polish, artificial nails, or any other type of covering on natural nails (fingers and toes) Do not wear jewelry or makeup Put on clean/comfortable clothes.  Please brush your teeth.  Ask your nurse before applying any prescription medications to the skin.     CHG Compatible Lotions   Aveeno Moisturizing lotion  Cetaphil  Moisturizing Cream  Cetaphil Moisturizing Lotion  Clairol Herbal Essence Moisturizing Lotion, Dry Skin  Clairol Herbal Essence Moisturizing Lotion, Extra Dry Skin  Clairol Herbal Essence Moisturizing Lotion, Normal Skin  Curel Age Defying Therapeutic Moisturizing Lotion with Alpha Hydroxy  Curel Extreme Care Body Lotion  Curel Soothing Hands Moisturizing Hand Lotion  Curel Therapeutic Moisturizing Cream, Fragrance-Free  Curel Therapeutic Moisturizing Lotion, Fragrance-Free  Curel Therapeutic Moisturizing Lotion, Original Formula  Eucerin Daily Replenishing Lotion  Eucerin Dry Skin Therapy Plus Alpha Hydroxy Crme  Eucerin Dry Skin Therapy Plus Alpha Hydroxy Lotion  Eucerin Original Crme  Eucerin Original Lotion  Eucerin Plus Crme Eucerin Plus Lotion  Eucerin TriLipid Replenishing Lotion  Keri Anti-Bacterial Hand Lotion  Keri Deep Conditioning Original Lotion Dry Skin Formula Softly Scented  Keri Deep Conditioning Original Lotion, Fragrance Free Sensitive Skin Formula  Keri Lotion Fast Absorbing Fragrance Free Sensitive Skin Formula  Keri Lotion Fast Absorbing Softly Scented Dry Skin Formula  Keri Original Lotion  Keri Skin Renewal Lotion Keri Silky Smooth Lotion  Keri Silky Smooth Sensitive Skin Lotion  Nivea Body Creamy Conditioning Oil  Nivea Body Extra Enriched Lotion  Nivea Body Original Lotion  Nivea Body Sheer Moisturizing Lotion Nivea Crme  Nivea Skin Firming Lotion  NutraDerm 30 Skin Lotion  NutraDerm Skin Lotion  NutraDerm Therapeutic Skin Cream  NutraDerm Therapeutic Skin Lotion  ProShield Protective Hand Cream  Provon moisturizing lotion  Please read over the following fact sheets that you were given.

## 2024-08-12 NOTE — H&P (Signed)
 KNEE ARTHROPLASTY ADMISSION H&P  Patient ID: Colleen Gray MRN: [id number] DOB/AGE: 72-Aug-1952 72 y.o.  Chief Complaint: right knee pain.  Planned Procedure Date: 08/23/24  Medical Clearance by Dr. Camellia Hutchinson   Cardiac Clearance by Scot Ford PA-C Nephrology clearance by Dr. Katheryn Saba   HPI: Colleen Gray is a 73 y.o. female who presents for evaluation of OA RIGHT KNEE. The patient has a history of pain and functional disability in the right knee due to arthritis and has failed non-surgical conservative treatments for greater than 12 weeks to include NSAID's and/or analgesics, corticosteriod injections, viscosupplementation injections, supervised PT with diminished ADL's post treatment, use of assistive devices, weight reduction as appropriate, and activity modification.  Onset of symptoms was gradual, starting >10 years ago with gradually worsening course since that time. The patient noted no past surgery on the right knee.  Patient currently rates pain at 7 out of 10 with activity. Patient has night pain, worsening of pain with activity and weight bearing, and pain that interferes with activities of daily living.  Patient has evidence of subchondral cysts, subchondral sclerosis, periarticular osteophytes, joint subluxation, and joint space narrowing by imaging studies.  There is no active infection.  Past Medical History:  Diagnosis Date   Anemia    Arthritis    Asthma    Bradycardia    Chronic renal failure, stage 4 (severe) (HCC) 2017   Complication of anesthesia    during anesthesia heart rate is low    Dysrhythmia    RBBB   Enlarged heart    Sees Dr. Ladona   ESRD on hemodialysis [facility])    Tues, Thurs, Sat - Dr. Saba   Heart murmur    due to rheumatic fever - Sees Dr Ladona   Hemorrhoid    History of kidney stones    History of rheumatic fever    with a heart murmer   Hypertension    Ovarian cyst 1985   removed   Pneumonia    Pre-diabetes    no meds   Psoriasis     Sleep apnea    does not use Cpap (after weight loss no longer needed)   Past Surgical History:  Procedure Laterality Date   A/V SHUNT INTERVENTION Left 06/10/2024   Procedure: A/V SHUNT INTERVENTION;  Surgeon: Gretta Lonni PARAS, MD;  Location: [facility];  Service: Cardiovascular;  Laterality: Left;   A/V SHUNT INTERVENTION N/A 06/27/2024   Procedure: A/V SHUNT INTERVENTION;  Surgeon: Magda Debby SAILOR, MD;  Location: [facility];  Service: Cardiovascular;  Laterality: N/A;   AV FISTULA PLACEMENT Left 05/24/2021   Procedure: LEFT ARM BRACHIOCEPHALIC ARTERIOVENOUS (AV) FISTULA CREATION;  Surgeon: Gretta Lonni PARAS, MD;  Location: [facility];  Service: Vascular;  Laterality: Left;   BASCILIC VEIN TRANSPOSITION Left 07/09/2022   Procedure: LEFT ARM  ARTERIOVENOUS FISTULA  CREATION;  Surgeon: Serene Gaile ORN, MD;  Location: [facility];  Service: Vascular;  Laterality: Left;   BASCILIC VEIN TRANSPOSITION Left 09/19/2022   Procedure: SECOND STAGE LEFT BASILIC VEIN TRANSPOSITION;  Surgeon: Serene Gaile ORN, MD;  Location: [facility];  Service: Vascular;  Laterality: Left;   BUNIONECTOMY WITH HAMMERTOE RECONSTRUCTION Bilateral 1980's; 1999; 2009    left; right; left 'w/plates and screws   COLONOSCOPY  2016   [facility] of colon cancer   COLONOSCOPY WITH PROPOFOL  N/A 01/23/2023   Procedure: COLONOSCOPY WITH PROPOFOL ;  Surgeon: Rollin Dover, MD;  Location: [facility];  Service: Gastroenterology;  Laterality: N/A;  DILATION AND CURETTAGE OF UTERUS  early 1980's   for missed AB   FISTULA SUPERFICIALIZATION Left 09/27/2021   Procedure: LEFT ARM ARTERIOVENOUS FISTULA REVISION AND SUPERFICIALIZATION;  Surgeon: Gretta Lonni PARAS, MD;  Location: [facility] OR;  Service: Vascular;  Laterality: Left;   LAPAROSCOPIC GASTRIC BYPASS  2007   LAPAROSCOPIC OVARIAN CYSTECTOMY Right 1985   POLYPECTOMY  01/23/2023   Procedure: POLYPECTOMY;  Surgeon: Rollin Dover, MD;  Location: [facility];  Service: Gastroenterology;;   REVERSE  SHOULDER ARTHROPLASTY Left 11/22/2015   Procedure: LEFT REVERSE SHOULDER ARTHROPLASTY;  Surgeon: Franky Pointer, MD;  Location: [facility];  Service: Orthopedics;  Laterality: Left;   REVERSE SHOULDER ARTHROPLASTY Right 05/13/2023   Procedure: REVERSE SHOULDER ARTHROPLASTY;  Surgeon: Cristy Bonner DASEN, MD;  Location: [facility];  Service: Orthopedics;  Laterality: Right;   ROTATOR CUFF REPAIR Right 2006   VENOUS ANGIOPLASTY  06/10/2024   Procedure: VENOUS ANGIOPLASTY;  Surgeon: Gretta Lonni PARAS, MD;  Location: [facility];  Service: Cardiovascular;;   VENOUS ANGIOPLASTY  06/27/2024   Procedure: VENOUS ANGIOPLASTY;  Surgeon: Magda Debby SAILOR, MD;  Location: [facility];  Service: Cardiovascular;;   Allergies  Allergen Reactions   Nifedipine Swelling   Other     Surgery Glue caused blisters   Penicillins Rash   Prior to Admission medications   Medication Sig Start Date End Date Taking? Authorizing Provider  albuterol  (VENTOLIN  HFA) 108 (90 Base) MCG/ACT inhaler Inhale 1-2 puffs into the lungs every 6 (six) hours as needed for wheezing or shortness of breath.   Yes [provider]  calcitRIOL  (ROCALTROL ) 0.5 MCG capsule Take 1 mcg by mouth Every Tuesday,Thursday,and Saturday with dialysis. 06/25/19  Yes [provider]  ferrous sulfate  325 (65 FE) MG tablet Take 325 mg by mouth every Monday, Wednesday, and Friday.   Yes [provider]  furosemide  (LASIX ) 80 MG tablet Take 1 tablet (80 mg total) by mouth 3 (three) times a week. 07/29/24  Yes Meng, Hao, PA  gabapentin  (NEURONTIN ) 100 MG capsule Take 100 mg by mouth at bedtime.   Yes [provider]  Multiple Vitamin (MULTIVITAMIN WITH MINERALS) TABS tablet Take 1 tablet by mouth daily.   Yes [provider]  nitroGLYCERIN  (NITROSTAT ) 0.4 MG SL tablet Place 1 tablet (0.4 mg total) under the tongue every 5 (five) minutes as needed for chest pain. 08/09/24  Yes Meng, Hao, PA  Omega 3 1000 MG CAPS Take 1,000 mg by mouth  daily.   Yes [provider]  sevelamer  carbonate (RENVELA ) 800 MG tablet Take 1,600 mg by mouth 3 (three) times daily with meals. 04/21/17  Yes [provider]  vitamin B-12 (CYANOCOBALAMIN) 1000 MCG tablet Take 1,000 mcg by mouth daily.   Yes [provider]  vitamin C  (ASCORBIC ACID) 500 MG tablet Take 500 mg by mouth daily.   Yes [provider]  Blood Glucose Monitoring Suppl (GLUCOCOM BLOOD GLUCOSE MONITOR) DEVI Use daily to check blood sugar. 06/29/18   [provider]   Social History   Socioeconomic History   Marital status: Widowed    Spouse name: Not on file   Number of children: 0   Years of education: Not on file   Highest education level: Not on file  Occupational History   Not on file  Tobacco Use   Smoking status: Never   Smokeless tobacco: Never  Vaping Use   Vaping status: Never Used  Substance and Sexual Activity   Alcohol use: No  Alcohol/week: 0.0 standard drinks of alcohol   Drug use: No   Sexual activity: Yes    Partners: Male    Birth control/protection: Post-menopausal  Other Topics Concern   Not on file  Social History Narrative   Pt husband died in 09/08/2012 from esophageal rupture.  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   Social Drivers of Health   Financial Resource Strain: Low Risk  (01/22/2022)   Received from [facility]   Overall Financial Resource Strain (CARDIA)  Food Insecurity: No Food Insecurity (05/15/2023)   Hunger Vital Sign    Worried About Running Out of Food in the Last Year: Never true    Ran Out of Food in the Last Year: Never true  Transportation Needs: No Transportation Needs (05/15/2023)   PRAPARE - Administrator, Civil Service (Medical): No    Lack of Transportation (Non-Medical): No  Physical Activity: Unknown (01/22/2022)   Received from [facility], [facility]  [facility] visits prior to 02/21/2023.   Exercise Vital Sign    On average, how many days per week do you engage in moderate to strenuous exercise (like a brisk walk)?: 0 days    Minutes of Exercise per Session: Not on file  Stress: No Stress Concern Present (01/22/2022)   Received from [facility] - Occupational Stress Questionnaire  Social Connections: Moderately Isolated (01/22/2022)   Received from [facility], [facility] visits prior to 02/21/2023.   Social Connection and Isolation Panel    In a typical week, how many times do you talk on the phone with family, friends, or neighbors?: Three times a week    How often do you get together with friends or relatives?: Once a week    How often do you attend church or religious services?: Never    Do you belong to any clubs or organizations such as church groups, unions, fraternal or athletic groups, or school groups?: Yes    How often do you attend meetings of the clubs or organizations you belong to?: 1 to 4 times per year    Are you married, widowed, divorced, separated, never married, or living with a partner?: Widowed   Family History  Problem Relation Age of Onset   Colon cancer Mother 47   Diabetes Mother    Hypertension Mother    Diabetes Father    Hypertension Father    Colon cancer Maternal Grandmother 69   Diabetes Paternal Grandmother    Diabetes Paternal Grandfather    Breast cancer Neg Hx     ROS: Currently denies lightheadedness, dizziness, Fever, chills, CP, SOB.   No personal history of DVT, PE, MI, or CVA. No loose teeth or dentures All other systems have been reviewed and were otherwise currently negative with the exception of those mentioned in the HPI and as above.  Objective: Vitals: Ht: 5' Wt: 203 lbs Temp: 98.7 BP: 111/65 Pulse: 76 O2 95% on room air.   Physical Exam: General: Alert, NAD.  Antalgic Gait  HEENT: EOMI, Good Neck Extension   Pulm: No increased work of breathing.  Clear B/L A/P w/o crackle or wheeze.  CV: RRR, No g/r appreciated. High-pitched blowing holosystolic murmur best heard at the apex of the heart consistent with mitral regurgitation heard GI: soft, NT, ND. BS x 4 quadrants Neuro: CN  II-XII grossly intact without focal deficit.  Sensation intact distally Skin: No lesions in the area of chief complaint MSK/Surgical Site: + JLT. ROM limited to 10-80 degrees.  3/5 strength in extension and flexion.  +EHL/FHL.  NVI.  Pain and instability with varus and valgus stress.    Imaging Review Plain radiographs demonstrate severe degenerative joint disease of the bilateral knee.   The overall alignment ismild valgus. The bone quality appears to be fair for age and reported activity level.  Preoperative templating of the joint replacement has been completed, documented, and submitted to the Operating Room personnel in order to optimize intra-operative equipment management.  Assessment: OA RIGHT KNEE Active Problems:   * No active hospital problems. *   Plan: Plan for Procedure(s): ARTHROPLASTY, KNEE, TOTAL  The patient history, physical exam, clinical judgement of the provider and imaging are consistent with end stage degenerative joint disease and total joint arthroplasty is deemed medically necessary. The treatment options including medical management, injection therapy, and arthroplasty were discussed at length. The risks and benefits of Procedure(s): ARTHROPLASTY, KNEE, TOTAL were presented and reviewed.  The risks of nonoperative treatment, versus surgical intervention including but not limited to continued pain, aseptic loosening, stiffness, dislocation/subluxation, infection, bleeding, nerve injury, blood clots, cardiopulmonary complications, morbidity, mortality, among others were discussed. The patient verbalizes understanding and wishes to proceed with the plan.  Patient is being admitted for inpatient  treatment for surgery, pain control, PT, prophylactic antibiotics, VTE prophylaxis, progressive ambulation, ADL's and discharge planning. She will have dialysis on Monday before surgery at her normal outpatient center. While in the hospital, she will have dialysis on Wednesday.  Dental prophylaxis discussed and recommended for 2 years postoperatively.  The patient does meet the criteria for TXA which will be used perioperatively.   Eliquis  2mg  mg bid will be used postoperatively for DVT prophylaxis in addition to SCDs, and early ambulation. States she already has enough of this medicine at home.  Plan for Tylenol  and oxycodone  for pain. Unable to take NSAIDs Robaxin  for muscle spasms.   Zofran  for nausea and vomiting. Senokot is for constipation prevention. Pharmacy- [facility] [address] The patient is planning to be discharged home with HHPT via [facility] and into the care of her aunt [name] Richard who can be reached at [phone] Follow up appt 09/07/24 at [facility] CHRISTELLA Ted RIGGERS Office [phone] 08/12/2024 7:50 PM

## 2024-08-15 ENCOUNTER — Encounter (HOSPITAL_COMMUNITY): Payer: Self-pay

## 2024-08-15 ENCOUNTER — Encounter (HOSPITAL_COMMUNITY)
Admission: RE | Admit: 2024-08-15 | Discharge: 2024-08-15 | Disposition: A | Discharge status: Home / Self Care | Source: Ambulatory Visit | Attending: Orthopedic Surgery | Admitting: Orthopedic Surgery

## 2024-08-15 ENCOUNTER — Other Ambulatory Visit: Payer: Self-pay

## 2024-08-15 VITALS — BP 113/58 | HR 64 | Temp 98.2°F | Resp 17 | Ht 63.0 in | Wt 199.3 lb

## 2024-08-15 DIAGNOSIS — Z01812 Encounter for preprocedural laboratory examination: Secondary | ICD-10-CM | POA: Insufficient documentation

## 2024-08-15 DIAGNOSIS — N186 End stage renal disease: Secondary | ICD-10-CM | POA: Diagnosis not present

## 2024-08-15 DIAGNOSIS — I120 Hypertensive chronic kidney disease with stage 5 chronic kidney disease or end stage renal disease: Secondary | ICD-10-CM | POA: Diagnosis not present

## 2024-08-15 DIAGNOSIS — Z992 Dependence on renal dialysis: Secondary | ICD-10-CM | POA: Insufficient documentation

## 2024-08-15 DIAGNOSIS — E1122 Type 2 diabetes mellitus with diabetic chronic kidney disease: Secondary | ICD-10-CM | POA: Insufficient documentation

## 2024-08-15 DIAGNOSIS — Z01818 Encounter for other preprocedural examination: Secondary | ICD-10-CM

## 2024-08-15 DIAGNOSIS — J45909 Unspecified asthma, uncomplicated: Secondary | ICD-10-CM | POA: Insufficient documentation

## 2024-08-15 DIAGNOSIS — G4733 Obstructive sleep apnea (adult) (pediatric): Secondary | ICD-10-CM | POA: Diagnosis not present

## 2024-08-15 DIAGNOSIS — I340 Nonrheumatic mitral (valve) insufficiency: Secondary | ICD-10-CM | POA: Insufficient documentation

## 2024-08-15 DIAGNOSIS — D631 Anemia in chronic kidney disease: Secondary | ICD-10-CM | POA: Insufficient documentation

## 2024-08-15 DIAGNOSIS — E119 Type 2 diabetes mellitus without complications: Secondary | ICD-10-CM

## 2024-08-15 HISTORY — DX: Type 2 diabetes mellitus without complications: E11.9

## 2024-08-15 HISTORY — DX: Dependence on renal dialysis: N18.6

## 2024-08-15 HISTORY — DX: End stage renal disease: Z99.2

## 2024-08-15 HISTORY — DX: Cardiac arrhythmia, unspecified: I49.9

## 2024-08-15 LAB — CBC
HCT: 35.9 % — ABNORMAL LOW (ref 36.0–46.0)
Hemoglobin: 11.3 g/dL — ABNORMAL LOW (ref 12.0–15.0)
MCH: 30.9 pg (ref 26.0–34.0)
MCHC: 31.5 g/dL (ref 30.0–36.0)
MCV: 98.1 fL (ref 80.0–100.0)
Platelets: 233 K/uL (ref 150–400)
RBC: 3.66 MIL/uL — ABNORMAL LOW (ref 3.87–5.11)
RDW: 13.7 % (ref 11.5–15.5)
WBC: 4.7 K/uL (ref 4.0–10.5)
nRBC: 0 % (ref 0.0–0.2)

## 2024-08-15 LAB — BASIC METABOLIC PANEL WITH GFR
Anion gap: 11 (ref 5–15)
BUN: 41 mg/dL — ABNORMAL HIGH (ref 8–23)
CO2: 29 mmol/L (ref 22–32)
Calcium: 8.9 mg/dL (ref 8.9–10.3)
Chloride: 101 mmol/L (ref 98–111)
Creatinine, Ser: 6.86 mg/dL — ABNORMAL HIGH (ref 0.44–1.00)
GFR, Estimated: 6 mL/min — ABNORMAL LOW (ref >60)
Glucose, Bld: 123 mg/dL — ABNORMAL HIGH (ref 70–99)
Potassium: 3.9 mmol/L (ref 3.5–5.1)
Sodium: 141 mmol/L (ref 135–145)

## 2024-08-15 LAB — HEMOGLOBIN A1C
Hgb A1c MFr Bld: 5.8 % — ABNORMAL HIGH (ref 4.8–5.6)
Mean Plasma Glucose: 119.76 mg/dL

## 2024-08-15 LAB — GLUCOSE, CAPILLARY: Glucose-Capillary: 117 mg/dL — ABNORMAL HIGH (ref 70–99)

## 2024-08-15 LAB — SURGICAL PCR SCREEN
MRSA, PCR: NEGATIVE
Staphylococcus aureus: NEGATIVE

## 2024-08-15 NOTE — Progress Notes (Signed)
 PCP - Dr. Camellia Hutchinson Cardiologist - Dr. Gordy Bergamo - last office visit 07/29/2024  PPM/ICD - Denies Device Orders - n/a Rep Notified - n/a  Chest x-ray - 02/22/2024 EKG - 07/29/2024 Stress Test - 12/17/2022 ECHO - 11/18/2022 Cardiac Cath - Denies  Sleep Study - Pt has diagnosis of OSA and used CPAP, but lost weight 10+ years ago and no longer needed CPAP  Pt is DM2. She checks her blood sugar every other day. Normal fasting range is 120s. CBG at pre-op  appointment 120. A1c result pending.  Last dose of GLP1 agonist- n/a GLP1 instructions: n/a  Blood Thinner Instructions: n/a Aspirin  Instructions: n/a  ERAS Protcol - Clear liquids until 0930 morning of surgery PRE-SURGERY Ensure or G2- G2 given to pt with instructions  COVID TEST- n/a   Anesthesia review: Yes. Cardiac clearance. HTN, murmur, Asthma, DM2, ESRD on HD. Pt verified that she will be having HD the day before surgery.   Patient denies shortness of breath, fever, cough and chest pain at PAT appointment. Pt denies any respiratory illness/infection in the last two months.    All instructions explained to the patient, with a verbal understanding of the material. Patient agrees to go over the instructions while at home for a better understanding. Patient also instructed to self quarantine after being tested for COVID-19. The opportunity to ask questions was provided.

## 2024-08-16 NOTE — Progress Notes (Signed)
 Anesthesia Chart Review:  73 year old female follows with cardiology for history of HTN, murmur.  Echocardiogram performed on 11/18/2022 showed EF 60 to 65%, mild concentric LVH, grade 1 DD, mild MR.  Subsequent Lexiscan  Myoview obtained on 12/17/2022 showed soft tissue attenuation artifact noted on the inferior wall, superimposed on this there was a reversible moderate size defect in the basal and mid inferior region, normal TID index, LV was mildly dilated both at rest and on the stress image.  Stress LVEF low normal at 52%.  Overall considered intermediate risk study.   Seen by Scot Ford, PA on 07/29/2024 for preop evaluation.  Per note, Preoperative clearance: She has upcoming right total knee surgery. She previously underwent Lexiscan  Myoview in December 2023 that came back somewhat abnormal, the abnormality was likely due to artifact. She was able to undergo right shoulder surgery in May 2024 without any issue. She denies any chest pain or shortness of breath. She is unable to accomplish more than 4 METS of activity. I discussed her case with DOD Dr. Verlin, given lack of any symptom, we do not recommend any ischemic workup at this time. She is deemed at acceptable risk to proceed with upcoming right total knee surgery.  Other pertinent history includes ESRD on HD Tuesday Thursday Saturday, OSA not on CPAP, non-insulin -dependent DM2, asthma.  Preop labs reviewed, creatinine 6.86 consistent with ESRD, mild anemia with hemoglobin 11.3, otherwise unremarkable.  DM2 well-controlled A1c 5.8.  EKG 07/29/2024: Normal sinus rhythm.  Rate 76. Right bundle branch block. Possible Lateral infarct , age undetermined  Lexiscan   Nuclear stress test 12/17/2022: Non-diagnostic ECG stress. The heart rate response was consistent with Regadenoson .  Myocardial perfusion is abnormal. Soft tissue attenuation noted noted in the inferior wall.  Super imposed on this, there is a reversible moderate sized defect in the basal  and mid inferior region.  LV is mildly dilated in both rest and stress images. Normal TID index.  Overall LV systolic function is abnormal with basal and mid inferior wall hypokinesis.  Stress LV EF low normal: 52%.  No previous exam available for comparison. Intermediate risk study.   Echocardiogram 11/18/2022:  Normal LV systolic function with visual EF 60-65%. Left ventricle cavity  is normal in size. Mild concentric hypertrophy of the left ventricle.  Normal global wall motion. Doppler evidence of grade I (impaired)  diastolic dysfunction, normal LAP. Calculated EF 67%.  Right ventricle cavity is slightly dilated. Normal right ventricular  function.  Trileaflet aortic valve with no regurgitation. Mild aortic valve leaflet  thickening.  Structurally normal mitral valve.  Mild (Grade I) mitral regurgitation.  Structurally normal tricuspid valve with trace regurgitation. No evidence  of pulmonary hypertension.  Compared to 09/2017, diastolic dysfunction is now grade I (previously  grade II). Otherwise, no significant change.     Lynwood Geofm RIGGERS [facility] Short Stay Center/Anesthesiology Phone [phone] 08/16/2024 4:18 PM

## 2024-08-16 NOTE — Anesthesia Preprocedure Evaluation (Addendum)
 Anesthesia Evaluation  Patient identified by MRN, date of birth, ID band Patient awake    Reviewed: Allergy & Precautions, NPO status , Patient's Chart, lab work & pertinent test results  History of Anesthesia Complications Negative for: history of anesthetic complications  Airway Mallampati: II  TM Distance: >3 FB Neck ROM: Full    Dental  (+) Missing,    Pulmonary asthma , sleep apnea    Pulmonary exam normal        Cardiovascular hypertension, Normal cardiovascular exam  TTE 11/18/2022: EF 60 to 65%, mild concentric LVH, grade 1 DD, mild MR   Neuro/Psych    GI/Hepatic negative GI ROS, Neg liver ROS,,,  Endo/Other  diabetes, Type 2    Renal/GU ESRF and DialysisRenal disease (HD T/Th/Sa)  negative genitourinary   Musculoskeletal  (+) Arthritis ,    Abdominal   Peds  Hematology  (+) Blood dyscrasia (Hgb 11.3, Plt 233), anemia   Anesthesia Other Findings Day of surgery medications reviewed with patient.  Reproductive/Obstetrics negative OB ROS                              Anesthesia Physical Anesthesia Plan  ASA: 3  Anesthesia Plan: Spinal   Post-op Pain Management: Tylenol  PO (pre-op )* and Regional block*   Induction:   PONV Risk Score and Plan: 3 and Treatment may vary due to age or medical condition, Ondansetron , Propofol  infusion and Dexamethasone   Airway Management Planned: Natural Airway and Simple Face Mask  Additional Equipment: None  Intra-op Plan:   Post-operative Plan:   Informed Consent: I have reviewed the patients History and Physical, chart, labs and discussed the procedure including the risks, benefits and alternatives for the proposed anesthesia with the patient or authorized representative who has indicated his/her understanding and acceptance.       Plan Discussed with: CRNA  Anesthesia Plan Comments: (PAT note by Lynwood Hope, PA-C: 73 year old  female follows with cardiology for history of HTN, murmur.  Echocardiogram performed on 11/18/2022 showed EF 60 to 65%, mild concentric LVH, grade 1 DD, mild MR.  Subsequent Lexiscan  Myoview obtained on 12/17/2022 showed soft tissue attenuation artifact noted on the inferior wall, superimposed on this there was a reversible moderate size defect in the basal and mid inferior region, normal TID index, LV was mildly dilated both at rest and on the stress image.  Stress LVEF low normal at 52%.  Overall considered intermediate risk study.   Seen by Scot Ford, PA on 07/29/2024 for preop evaluation.  Per note, Preoperative clearance: She has upcoming right total knee surgery. She previously underwent Lexiscan  Myoview in December 2023 that came back somewhat abnormal, the abnormality was likely due to artifact. She was able to undergo right shoulder surgery in May 2024 without any issue. She denies any chest pain or shortness of breath. She is unable to accomplish more than 4 METS of activity. I discussed her case with DOD Dr. Verlin, given lack of any symptom, we do not recommend any ischemic workup at this time. She is deemed at acceptable risk to proceed with upcoming right total knee surgery.  Other pertinent history includes ESRD on HD Tuesday Thursday Saturday, OSA not on CPAP, non-insulin -dependent DM2, asthma.  Preop labs reviewed, creatinine 6.86 consistent with ESRD, mild anemia with hemoglobin 11.3, otherwise unremarkable.  DM2 well-controlled A1c 5.8.  EKG 07/29/2024: Normal sinus rhythm.  Rate 76. Right bundle branch block. Possible Lateral infarct , age undetermined  Lexiscan   Nuclear stress test 12/17/2022: Non-diagnostic ECG stress. The heart rate response was consistent with Regadenoson .  Myocardial perfusion is abnormal. Soft tissue attenuation noted noted in the inferior wall.  Super imposed on this, there is a reversible moderate sized defect in the basal and mid inferior region.  LV is mildly  dilated in both rest and stress images. Normal TID index.  Overall LV systolic function is abnormal with basal and mid inferior wall hypokinesis.  Stress LV EF low normal: 52%.  No previous exam available for comparison. Intermediate risk study.   Echocardiogram 11/18/2022:  Normal LV systolic function with visual EF 60-65%. Left ventricle cavity  is normal in size. Mild concentric hypertrophy of the left ventricle.  Normal global wall motion. Doppler evidence of grade I (impaired)  diastolic dysfunction, normal LAP. Calculated EF 67%.  Right ventricle cavity is slightly dilated. Normal right ventricular  function.  Trileaflet aortic valve with no regurgitation. Mild aortic valve leaflet  thickening.  Structurally normal mitral valve.  Mild (Grade I) mitral regurgitation.  Structurally normal tricuspid valve with trace regurgitation. No evidence  of pulmonary hypertension.  Compared to 09/2017, diastolic dysfunction is now grade I (previously  grade II). Otherwise, no significant change.    )         Anesthesia Quick Evaluation

## 2024-08-23 ENCOUNTER — Encounter (HOSPITAL_COMMUNITY): Payer: Self-pay | Admitting: Orthopedic Surgery

## 2024-08-23 ENCOUNTER — Ambulatory Visit (HOSPITAL_COMMUNITY): Admitting: Anesthesiology

## 2024-08-23 ENCOUNTER — Other Ambulatory Visit: Payer: Self-pay

## 2024-08-23 ENCOUNTER — Ambulatory Visit (HOSPITAL_COMMUNITY): Payer: Self-pay | Admitting: Physician Assistant

## 2024-08-23 ENCOUNTER — Inpatient Hospital Stay (HOSPITAL_COMMUNITY)
Admission: RE | Admit: 2024-08-23 | Discharge: 2024-08-27 | DRG: 469 | Disposition: A | Discharge status: Skilled Nursing Facility | Attending: Orthopedic Surgery | Admitting: Orthopedic Surgery

## 2024-08-23 ENCOUNTER — Observation Stay (HOSPITAL_COMMUNITY)

## 2024-08-23 ENCOUNTER — Encounter (HOSPITAL_COMMUNITY)
Admission: RE | Disposition: A | Discharge status: Skilled Nursing Facility | Payer: Self-pay | Source: Home / Self Care | Attending: Orthopedic Surgery

## 2024-08-23 DIAGNOSIS — E119 Type 2 diabetes mellitus without complications: Secondary | ICD-10-CM

## 2024-08-23 DIAGNOSIS — Z79899 Other long term (current) drug therapy: Secondary | ICD-10-CM

## 2024-08-23 DIAGNOSIS — E1122 Type 2 diabetes mellitus with diabetic chronic kidney disease: Secondary | ICD-10-CM

## 2024-08-23 DIAGNOSIS — D631 Anemia in chronic kidney disease: Secondary | ICD-10-CM | POA: Diagnosis present

## 2024-08-23 DIAGNOSIS — Z888 Allergy status to other drugs, medicaments and biological substances status: Secondary | ICD-10-CM

## 2024-08-23 DIAGNOSIS — Z9884 Bariatric surgery status: Secondary | ICD-10-CM

## 2024-08-23 DIAGNOSIS — M25761 Osteophyte, right knee: Secondary | ICD-10-CM | POA: Diagnosis present

## 2024-08-23 DIAGNOSIS — Z01818 Encounter for other preprocedural examination: Secondary | ICD-10-CM

## 2024-08-23 DIAGNOSIS — N186 End stage renal disease: Secondary | ICD-10-CM

## 2024-08-23 DIAGNOSIS — I120 Hypertensive chronic kidney disease with stage 5 chronic kidney disease or end stage renal disease: Secondary | ICD-10-CM

## 2024-08-23 DIAGNOSIS — Z833 Family history of diabetes mellitus: Secondary | ICD-10-CM

## 2024-08-23 DIAGNOSIS — Z880 Allergy status to penicillin: Secondary | ICD-10-CM

## 2024-08-23 DIAGNOSIS — Z992 Dependence on renal dialysis: Secondary | ICD-10-CM

## 2024-08-23 DIAGNOSIS — Z8249 Family history of ischemic heart disease and other diseases of the circulatory system: Secondary | ICD-10-CM

## 2024-08-23 DIAGNOSIS — Z96651 Presence of right artificial knee joint: Principal | ICD-10-CM

## 2024-08-23 DIAGNOSIS — M1711 Unilateral primary osteoarthritis, right knee: Secondary | ICD-10-CM

## 2024-08-23 DIAGNOSIS — Z96611 Presence of right artificial shoulder joint: Secondary | ICD-10-CM | POA: Diagnosis present

## 2024-08-23 DIAGNOSIS — Z91048 Other nonmedicinal substance allergy status: Secondary | ICD-10-CM

## 2024-08-23 DIAGNOSIS — Z96612 Presence of left artificial shoulder joint: Secondary | ICD-10-CM | POA: Diagnosis present

## 2024-08-23 HISTORY — PX: TOTAL KNEE ARTHROPLASTY: SHX125

## 2024-08-23 LAB — POCT I-STAT, CHEM 8
BUN: 35 mg/dL — ABNORMAL HIGH (ref 8–23)
Calcium, Ion: 0.96 mmol/L — ABNORMAL LOW (ref 1.15–1.40)
Chloride: 99 mmol/L (ref 98–111)
Creatinine, Ser: 5.3 mg/dL — ABNORMAL HIGH (ref 0.44–1.00)
Glucose, Bld: 108 mg/dL — ABNORMAL HIGH (ref 70–99)
HCT: 32 % — ABNORMAL LOW (ref 36.0–46.0)
Hemoglobin: 10.9 g/dL — ABNORMAL LOW (ref 12.0–15.0)
Potassium: 4.1 mmol/L (ref 3.5–5.1)
Sodium: 138 mmol/L (ref 135–145)
TCO2: 31 mmol/L (ref 22–32)

## 2024-08-23 LAB — CBC WITH DIFFERENTIAL/PLATELET
Abs Immature Granulocytes: 0.05 K/uL (ref 0.00–0.07)
Basophils Absolute: 0 K/uL (ref 0.0–0.1)
Basophils Relative: 0 %
Eosinophils Absolute: 0 K/uL (ref 0.0–0.5)
Eosinophils Relative: 0 %
HCT: 30.5 % — ABNORMAL LOW (ref 36.0–46.0)
Hemoglobin: 9.8 g/dL — ABNORMAL LOW (ref 12.0–15.0)
Immature Granulocytes: 1 %
Lymphocytes Relative: 6 %
Lymphs Abs: 0.7 K/uL (ref 0.7–4.0)
MCH: 31.4 pg (ref 26.0–34.0)
MCHC: 32.1 g/dL (ref 30.0–36.0)
MCV: 97.8 fL (ref 80.0–100.0)
Monocytes Absolute: 0.4 K/uL (ref 0.1–1.0)
Monocytes Relative: 4 %
Neutro Abs: 9.4 K/uL — ABNORMAL HIGH (ref 1.7–7.7)
Neutrophils Relative %: 89 %
Platelets: 179 K/uL (ref 150–400)
RBC: 3.12 MIL/uL — ABNORMAL LOW (ref 3.87–5.11)
RDW: 13.2 % (ref 11.5–15.5)
WBC: 10.5 K/uL (ref 4.0–10.5)
nRBC: 0 % (ref 0.0–0.2)

## 2024-08-23 LAB — COMPREHENSIVE METABOLIC PANEL WITH GFR
ALT: 15 U/L (ref 0–44)
AST: 23 U/L (ref 15–41)
Albumin: 3.4 g/dL — ABNORMAL LOW (ref 3.5–5.0)
Alkaline Phosphatase: 95 U/L (ref 38–126)
Anion gap: 16 — ABNORMAL HIGH (ref 5–15)
BUN: 36 mg/dL — ABNORMAL HIGH (ref 8–23)
CO2: 24 mmol/L (ref 22–32)
Calcium: 8.4 mg/dL — ABNORMAL LOW (ref 8.9–10.3)
Chloride: 99 mmol/L (ref 98–111)
Creatinine, Ser: 5.79 mg/dL — ABNORMAL HIGH (ref 0.44–1.00)
GFR, Estimated: 7 mL/min — ABNORMAL LOW (ref >60)
Glucose, Bld: 250 mg/dL — ABNORMAL HIGH (ref 70–99)
Potassium: 4.4 mmol/L (ref 3.5–5.1)
Sodium: 139 mmol/L (ref 135–145)
Total Bilirubin: 0.5 mg/dL (ref 0.0–1.2)
Total Protein: 6.2 g/dL — ABNORMAL LOW (ref 6.5–8.1)

## 2024-08-23 LAB — GLUCOSE, CAPILLARY: Glucose-Capillary: 102 mg/dL — ABNORMAL HIGH (ref 70–99)

## 2024-08-23 SURGERY — ARTHROPLASTY, KNEE, TOTAL
Anesthesia: Spinal | Site: Knee | Laterality: Right

## 2024-08-23 MED ORDER — MENTHOL 3 MG MT LOZG: 1.0000 | LOZENGE | OROMUCOSAL | Status: DC | PRN

## 2024-08-23 MED ORDER — ONDANSETRON HCL 4 MG/2ML IJ SOLN
INTRAMUSCULAR | Status: DC | PRN
  Administered 2024-08-23: 4 mg via INTRAVENOUS

## 2024-08-23 MED ORDER — PANTOPRAZOLE SODIUM 40 MG PO TBEC
40.0000 mg | DELAYED_RELEASE_TABLET | Freq: Every day | ORAL | Status: DC
  Administered 2024-08-23 – 2024-08-27 (×5): 40 mg via ORAL
  Filled 2024-08-23 (×5): qty 1

## 2024-08-23 MED ORDER — ACETAMINOPHEN 500 MG PO TABS
1000.0000 mg | ORAL_TABLET | Freq: Once | ORAL | Status: AC
  Administered 2024-08-23: 1000 mg via ORAL
  Filled 2024-08-23: qty 2

## 2024-08-23 MED ORDER — ACETAMINOPHEN 500 MG PO TABS
1000.0000 mg | ORAL_TABLET | Freq: Four times a day (QID) | ORAL | Status: AC
  Administered 2024-08-24 (×4): 1000 mg via ORAL
  Filled 2024-08-23 (×3): qty 2

## 2024-08-23 MED ORDER — EPHEDRINE SULFATE-NACL 50-0.9 MG/10ML-% IV SOSY
PREFILLED_SYRINGE | INTRAVENOUS | Status: DC | PRN
  Administered 2024-08-23 (×2): 5 mg via INTRAVENOUS

## 2024-08-23 MED ORDER — DROPERIDOL 2.5 MG/ML IJ SOLN: 0.6250 mg | Freq: Once | INTRAMUSCULAR | Status: DC | PRN

## 2024-08-23 MED ORDER — APIXABAN 2.5 MG PO TABS
2.5000 mg | ORAL_TABLET | Freq: Two times a day (BID) | ORAL | Status: DC
  Administered 2024-08-24 – 2024-08-27 (×6): 2.5 mg via ORAL
  Filled 2024-08-23 (×6): qty 1

## 2024-08-23 MED ORDER — CHLORHEXIDINE GLUCONATE 0.12 % MT SOLN
15.0000 mL | Freq: Once | OROMUCOSAL | Status: AC
  Administered 2024-08-23: 15 mL via OROMUCOSAL
  Filled 2024-08-23: qty 15

## 2024-08-23 MED ORDER — ONDANSETRON HCL 4 MG/2ML IJ SOLN: 4.0000 mg | Freq: Four times a day (QID) | INTRAMUSCULAR | Status: DC | PRN

## 2024-08-23 MED ORDER — OXYCODONE HCL 5 MG PO TABS
5.0000 mg | ORAL_TABLET | ORAL | Status: DC | PRN
  Administered 2024-08-25: 5 mg via ORAL
  Filled 2024-08-23 (×2): qty 1

## 2024-08-23 MED ORDER — DIPHENHYDRAMINE HCL 12.5 MG/5ML PO ELIX: 12.5000 mg | ORAL_SOLUTION | ORAL | Status: DC | PRN

## 2024-08-23 MED ORDER — ADULT MULTIVITAMIN W/MINERALS CH
1.0000 | ORAL_TABLET | Freq: Every day | ORAL | Status: DC
  Administered 2024-08-24 – 2024-08-27 (×4): 1 via ORAL
  Filled 2024-08-23 (×4): qty 1

## 2024-08-23 MED ORDER — ORAL CARE MOUTH RINSE: 15.0000 mL | Freq: Once | OROMUCOSAL | Status: AC

## 2024-08-23 MED ORDER — DEXAMETHASONE SODIUM PHOSPHATE 10 MG/ML IJ SOLN
8.0000 mg | Freq: Once | INTRAMUSCULAR | Status: AC
  Administered 2024-08-23: 8 mg via INTRAVENOUS
  Filled 2024-08-23: qty 1

## 2024-08-23 MED ORDER — CEFAZOLIN SODIUM-DEXTROSE 2-4 GM/100ML-% IV SOLN
2.0000 g | INTRAVENOUS | Status: AC
  Administered 2024-08-23: 2 g via INTRAVENOUS
  Filled 2024-08-23: qty 100

## 2024-08-23 MED ORDER — ALBUTEROL SULFATE (2.5 MG/3ML) 0.083% IN NEBU: 2.5000 mg | INHALATION_SOLUTION | Freq: Four times a day (QID) | RESPIRATORY_TRACT | Status: DC | PRN

## 2024-08-23 MED ORDER — STERILE WATER FOR IRRIGATION IR SOLN
Status: DC | PRN
Start: 2024-08-23 — End: 2024-08-23
  Administered 2024-08-23: 1000 mL

## 2024-08-23 MED ORDER — METHOCARBAMOL 500 MG PO TABS
500.0000 mg | ORAL_TABLET | Freq: Four times a day (QID) | ORAL | Status: DC | PRN
  Administered 2024-08-24 – 2024-08-26 (×4): 500 mg via ORAL
  Filled 2024-08-23 (×5): qty 1

## 2024-08-23 MED ORDER — OXYCODONE HCL 5 MG PO TABS
10.0000 mg | ORAL_TABLET | ORAL | Status: DC | PRN
  Administered 2024-08-24 – 2024-08-27 (×10): 10 mg via ORAL
  Filled 2024-08-23 (×9): qty 2

## 2024-08-23 MED ORDER — 0.9 % SODIUM CHLORIDE (POUR BTL) OPTIME
TOPICAL | Status: DC | PRN
  Administered 2024-08-23: 1000 mL

## 2024-08-23 MED ORDER — BUPIVACAINE LIPOSOME 1.3 % IJ SUSP
INTRAMUSCULAR | Status: AC
  Filled 2024-08-23: qty 20

## 2024-08-23 MED ORDER — ACETAMINOPHEN 325 MG PO TABS
325.0000 mg | ORAL_TABLET | Freq: Four times a day (QID) | ORAL | Status: DC | PRN
  Administered 2024-08-26 (×2): 650 mg via ORAL
  Filled 2024-08-23 (×2): qty 2

## 2024-08-23 MED ORDER — MAGNESIUM CITRATE PO SOLN: 1.0000 | Freq: Once | ORAL | Status: DC | PRN

## 2024-08-23 MED ORDER — PHENYLEPHRINE HCL-NACL 20-0.9 MG/250ML-% IV SOLN
INTRAVENOUS | Status: DC | PRN
  Administered 2024-08-23: 25 ug/min via INTRAVENOUS

## 2024-08-23 MED ORDER — POVIDONE-IODINE 10 % EX SWAB
2.0000 | Freq: Once | CUTANEOUS | Status: AC
  Administered 2024-08-23: 2 via TOPICAL

## 2024-08-23 MED ORDER — ALBUMIN HUMAN 5 % IV SOLN: INTRAVENOUS | Status: DC | PRN

## 2024-08-23 MED ORDER — PHENOL 1.4 % MT LIQD
1.0000 | OROMUCOSAL | Status: DC | PRN
Start: 2024-08-23 — End: 2024-08-27

## 2024-08-23 MED ORDER — FENTANYL CITRATE (PF) 100 MCG/2ML IJ SOLN: 25.0000 ug | INTRAMUSCULAR | Status: DC | PRN

## 2024-08-23 MED ORDER — TRANEXAMIC ACID-NACL 1000-0.7 MG/100ML-% IV SOLN
1000.0000 mg | INTRAVENOUS | Status: AC
  Administered 2024-08-23: 1000 mg via INTRAVENOUS
  Filled 2024-08-23: qty 100

## 2024-08-23 MED ORDER — BUPIVACAINE IN DEXTROSE 0.75-8.25 % IT SOLN
INTRATHECAL | Status: DC | PRN
  Administered 2024-08-23: 1.6 mL via INTRATHECAL

## 2024-08-23 MED ORDER — DOCUSATE SODIUM 100 MG PO CAPS
100.0000 mg | ORAL_CAPSULE | Freq: Two times a day (BID) | ORAL | Status: DC
  Administered 2024-08-24 – 2024-08-26 (×5): 100 mg via ORAL
  Filled 2024-08-23 (×5): qty 1

## 2024-08-23 MED ORDER — PROPOFOL 500 MG/50ML IV EMUL
INTRAVENOUS | Status: DC | PRN
  Administered 2024-08-23: 50 ug/kg/min via INTRAVENOUS

## 2024-08-23 MED ORDER — DEXAMETHASONE SODIUM PHOSPHATE 10 MG/ML IJ SOLN
INTRAMUSCULAR | Status: AC
Start: 2024-08-23 — End: 2024-08-23
  Filled 2024-08-23: qty 1

## 2024-08-23 MED ORDER — FENTANYL CITRATE (PF) 100 MCG/2ML IJ SOLN
INTRAMUSCULAR | Status: AC
  Administered 2024-08-23: 50 ug via INTRAVENOUS
  Filled 2024-08-23: qty 2

## 2024-08-23 MED ORDER — FENTANYL CITRATE (PF) 250 MCG/5ML IJ SOLN
INTRAMUSCULAR | Status: AC
  Filled 2024-08-23: qty 5

## 2024-08-23 MED ORDER — OXYCODONE HCL 5 MG PO TABS: 5.0000 mg | ORAL_TABLET | Freq: Once | ORAL | Status: DC | PRN

## 2024-08-23 MED ORDER — SEVELAMER CARBONATE 800 MG PO TABS
1600.0000 mg | ORAL_TABLET | Freq: Three times a day (TID) | ORAL | Status: DC
  Administered 2024-08-24 – 2024-08-27 (×9): 1600 mg via ORAL
  Filled 2024-08-23 (×9): qty 2

## 2024-08-23 MED ORDER — BISACODYL 10 MG RE SUPP: 10.0000 mg | Freq: Every day | RECTAL | Status: DC | PRN

## 2024-08-23 MED ORDER — ONDANSETRON HCL 4 MG/2ML IJ SOLN
INTRAMUSCULAR | Status: AC
  Filled 2024-08-23: qty 2

## 2024-08-23 MED ORDER — FENTANYL CITRATE (PF) 100 MCG/2ML IJ SOLN: 50.0000 ug | Freq: Once | INTRAMUSCULAR | Status: AC

## 2024-08-23 MED ORDER — DEXAMETHASONE SODIUM PHOSPHATE 10 MG/ML IJ SOLN
10.0000 mg | Freq: Once | INTRAMUSCULAR | Status: AC
  Administered 2024-08-24: 10 mg via INTRAVENOUS
  Filled 2024-08-23: qty 1

## 2024-08-23 MED ORDER — ALUM & MAG HYDROXIDE-SIMETH 200-200-20 MG/5ML PO SUSP: 30.0000 mL | ORAL | Status: DC | PRN

## 2024-08-23 MED ORDER — ALBUTEROL SULFATE HFA 108 (90 BASE) MCG/ACT IN AERS: 1.0000 | INHALATION_SPRAY | Freq: Four times a day (QID) | RESPIRATORY_TRACT | Status: DC | PRN

## 2024-08-23 MED ORDER — BUPIVACAINE LIPOSOME 1.3 % IJ SUSP: 20.0000 mL | Freq: Once | INTRAMUSCULAR | Status: DC

## 2024-08-23 MED ORDER — PROPOFOL 10 MG/ML IV BOLUS
INTRAVENOUS | Status: DC | PRN
  Administered 2024-08-23: 30 mg via INTRAVENOUS
  Administered 2024-08-23: 20 mg via INTRAVENOUS

## 2024-08-23 MED ORDER — SODIUM CHLORIDE (PF) 0.9 % IJ SOLN
INTRAMUSCULAR | Status: AC
  Filled 2024-08-23: qty 50

## 2024-08-23 MED ORDER — LACTATED RINGERS IV SOLN: INTRAVENOUS | Status: DC

## 2024-08-23 MED ORDER — ALBUMIN HUMAN 5 % IV SOLN
INTRAVENOUS | Status: AC
  Filled 2024-08-23: qty 250

## 2024-08-23 MED ORDER — SODIUM CHLORIDE (PF) 0.9 % IJ SOLN
INTRAMUSCULAR | Status: DC | PRN
  Administered 2024-08-23: 50 mL via INTRAMUSCULAR

## 2024-08-23 MED ORDER — BUPIVACAINE-EPINEPHRINE (PF) 0.5% -1:200000 IJ SOLN
INTRAMUSCULAR | Status: DC | PRN
  Administered 2024-08-23: 15 mL via PERINEURAL

## 2024-08-23 MED ORDER — ONDANSETRON HCL 4 MG PO TABS: 4.0000 mg | ORAL_TABLET | Freq: Four times a day (QID) | ORAL | Status: DC | PRN

## 2024-08-23 MED ORDER — POLYETHYLENE GLYCOL 3350 17 G PO PACK: 17.0000 g | PACK | Freq: Every day | ORAL | Status: DC | PRN

## 2024-08-23 MED ORDER — GABAPENTIN 100 MG PO CAPS
100.0000 mg | ORAL_CAPSULE | Freq: Every day | ORAL | Status: DC
  Administered 2024-08-24 – 2024-08-26 (×3): 100 mg via ORAL
  Filled 2024-08-23 (×3): qty 1

## 2024-08-23 MED ORDER — VITAMIN B-12 1000 MCG PO TABS
1000.0000 ug | ORAL_TABLET | Freq: Every day | ORAL | Status: DC
  Administered 2024-08-24 – 2024-08-27 (×4): 1000 ug via ORAL
  Filled 2024-08-23 (×4): qty 1

## 2024-08-23 MED ORDER — METOCLOPRAMIDE HCL 5 MG/ML IJ SOLN: 5.0000 mg | Freq: Three times a day (TID) | INTRAMUSCULAR | Status: DC | PRN

## 2024-08-23 MED ORDER — ALBUMIN HUMAN 5 % IV SOLN
12.5000 g | Freq: Once | INTRAVENOUS | Status: AC
  Administered 2024-08-23: 12.5 g via INTRAVENOUS

## 2024-08-23 MED ORDER — CEFAZOLIN SODIUM-DEXTROSE 2-4 GM/100ML-% IV SOLN
2.0000 g | Freq: Four times a day (QID) | INTRAVENOUS | Status: AC
  Administered 2024-08-23: 2 g via INTRAVENOUS
  Filled 2024-08-23: qty 100

## 2024-08-23 MED ORDER — SODIUM CHLORIDE 0.9 % IV SOLN: INTRAVENOUS | Status: DC

## 2024-08-23 MED ORDER — CLONIDINE HCL (ANALGESIA) 100 MCG/ML EP SOLN
EPIDURAL | Status: DC | PRN
  Administered 2024-08-23: 100 ug

## 2024-08-23 MED ORDER — SODIUM CHLORIDE 0.9 % IR SOLN
Status: DC | PRN
  Administered 2024-08-23: 3000 mL

## 2024-08-23 MED ORDER — OXYCODONE HCL 5 MG/5ML PO SOLN: 5.0000 mg | Freq: Once | ORAL | Status: DC | PRN

## 2024-08-23 MED ORDER — TRANEXAMIC ACID-NACL 1000-0.7 MG/100ML-% IV SOLN
1000.0000 mg | Freq: Once | INTRAVENOUS | Status: AC
  Administered 2024-08-23: 1000 mg via INTRAVENOUS
  Filled 2024-08-23: qty 100

## 2024-08-23 MED ORDER — HYDROMORPHONE HCL 1 MG/ML IJ SOLN
0.5000 mg | INTRAMUSCULAR | Status: DC | PRN
  Administered 2024-08-23 – 2024-08-25 (×3): 0.5 mg via INTRAVENOUS
  Filled 2024-08-23 (×3): qty 1

## 2024-08-23 MED ORDER — METOCLOPRAMIDE HCL 5 MG PO TABS: 5.0000 mg | ORAL_TABLET | Freq: Three times a day (TID) | ORAL | Status: DC | PRN

## 2024-08-23 MED ORDER — METHOCARBAMOL 1000 MG/10ML IJ SOLN: 500.0000 mg | Freq: Four times a day (QID) | INTRAMUSCULAR | Status: DC | PRN

## 2024-08-23 SURGICAL SUPPLY — 55 items
BAG COUNTER SPONGE SURGICOUNT (BAG) ×1 IMPLANT
BANDAGE ESMARK 6X9 LF (GAUZE/BANDAGES/DRESSINGS) ×1 IMPLANT
BASEPLATE TIBIAL PRIMARY SZ4 (Plate) IMPLANT
BLADE SAG 18X100X1.27 (BLADE) ×2 IMPLANT
BLADE SAGITTAL WIDE XTHICK NO (BLADE) IMPLANT
BNDG ELASTIC 6X10 VLCR STRL LF (GAUZE/BANDAGES/DRESSINGS) ×1 IMPLANT
BNDG ELASTIC 6X15 VLCR STRL LF (GAUZE/BANDAGES/DRESSINGS) ×1 IMPLANT
BOWL SMART MIX CTS (DISPOSABLE) ×1 IMPLANT
CEMENT BONE REFOBACIN R1X40 US (Cement) IMPLANT
CLSR STERI-STRIP ANTIMIC 1/2X4 (GAUZE/BANDAGES/DRESSINGS) ×2 IMPLANT
COMPONENT FEM 4 RT CRT RTN CMN (Joint) IMPLANT
COVER SURGICAL LIGHT HANDLE (MISCELLANEOUS) ×1 IMPLANT
CUFF TRNQT CYL 34X4.125X (TOURNIQUET CUFF) ×1 IMPLANT
DRAPE EXTREMITY T 121X128X90 (DISPOSABLE) ×1 IMPLANT
DRAPE HALF SHEET 40X57 (DRAPES) ×1 IMPLANT
DRAPE IMP U-DRAPE 54X76 (DRAPES) ×1 IMPLANT
DRAPE U-SHAPE 47X51 STRL (DRAPES) ×1 IMPLANT
DRSG AQUACEL AG ADV 3.5X10 (GAUZE/BANDAGES/DRESSINGS) IMPLANT
DRSG MEPILEX POST OP 4X12 (GAUZE/BANDAGES/DRESSINGS) ×1 IMPLANT
DRSG MEPILEX POST OP 4X8 (GAUZE/BANDAGES/DRESSINGS) ×1 IMPLANT
DURAPREP 26ML APPLICATOR (WOUND CARE) ×2 IMPLANT
ELECT CAUTERY BLADE 6.4 (BLADE) ×1 IMPLANT
ELECTRODE REM PT RTRN 9FT ADLT (ELECTROSURGICAL) ×1 IMPLANT
FACESHIELD WRAPAROUND OR TEAM (MASK) ×2 IMPLANT
GLOVE BIO SURGEON STRL SZ7.5 (GLOVE) ×1 IMPLANT
GLOVE BIOGEL PI IND STRL 7.5 (GLOVE) ×1 IMPLANT
GLOVE BIOGEL PI IND STRL 8 (GLOVE) ×1 IMPLANT
GLOVE SURG SYN 7.5 PF PI (GLOVE) ×1 IMPLANT
GOWN STRL REUS W/ TWL LRG LVL3 (GOWN DISPOSABLE) ×1 IMPLANT
GOWN STRL REUS W/ TWL XL LVL3 (GOWN DISPOSABLE) ×2 IMPLANT
IMMOBILIZER KNEE 22 UNIV (SOFTGOODS) ×1 IMPLANT
IMMOBILIZER KNEE 24 THIGH 36 (SOFTGOODS) IMPLANT
INSERT TRIATH X3 SZ4 9 (Insert) IMPLANT
KIT BASIN OR (CUSTOM PROCEDURE TRAY) ×1 IMPLANT
KIT TURNOVER KIT B (KITS) ×1 IMPLANT
MANIFOLD NEPTUNE II (INSTRUMENTS) ×1 IMPLANT
NDL 18GX1X1/2 (RX/OR ONLY) (NEEDLE) ×1 IMPLANT
NEEDLE 18GX1X1/2 (RX/OR ONLY) (NEEDLE) ×1 IMPLANT
NS IRRIG 1000ML POUR BTL (IV SOLUTION) ×1 IMPLANT
PACK TOTAL JOINT (CUSTOM PROCEDURE TRAY) ×1 IMPLANT
PAD ARMBOARD POSITIONER FOAM (MISCELLANEOUS) ×1 IMPLANT
PATELLAR KNEE 32X10 X3 (Joint) IMPLANT
PIN FLUTED HEDLESS FIX 3.5X1/8 (PIN) IMPLANT
SET HNDPC FAN SPRY TIP SCT (DISPOSABLE) IMPLANT
SUCTION TUBE FRAZIER 10FR DISP (SUCTIONS) ×1 IMPLANT
SUT MNCRL AB 4-0 PS2 18 (SUTURE) ×1 IMPLANT
SUT MON AB 2-0 CT1 27 (SUTURE) ×1 IMPLANT
SUT VIC AB 0 CT1 27XBRD ANBCTR (SUTURE) ×1 IMPLANT
SUT VIC AB 1 CT1 27XBRD ANBCTR (SUTURE) ×2 IMPLANT
SYR 50ML LL SCALE MARK (SYRINGE) ×1 IMPLANT
TOWEL GREEN STERILE (TOWEL DISPOSABLE) ×1 IMPLANT
TOWEL GREEN STERILE FF (TOWEL DISPOSABLE) ×1 IMPLANT
TRAY CATH INTERMITTENT SS 16FR (CATHETERS) IMPLANT
TRAY FOLEY BAG SILVER LF 14FR (CATHETERS) IMPLANT
TUBE SUCT ARGYLE STRL (TUBING) IMPLANT

## 2024-08-23 NOTE — Transfer of Care (Signed)
 Immediate Anesthesia Transfer of Care Note  Patient: Colleen Gray  Procedure(s) Performed: ARTHROPLASTY, KNEE, TOTAL, RIGHT (Right: Knee)  Patient Location: PACU  Anesthesia Type:MAC and Spinal  Level of Consciousness: awake, alert , oriented, and patient cooperative  Airway & Oxygen Therapy: Patient Spontanous Breathing and Patient connected to face mask oxygen  Post-op Assessment: Report given to RN and Post -op Vital signs reviewed and stable  Post vital signs: Reviewed and stable  Last Vitals:  Vitals Value Taken Time  BP 106/55 08/23/24 16:00  Temp    Pulse 79 08/23/24 16:02  Resp 14 08/23/24 16:02  SpO2 99 % 08/23/24 16:02  Vitals shown include unfiled device data.  Last Pain:  Vitals:   08/23/24 1205  TempSrc:   PainSc: 8       Patients Stated Pain Goal: 3 (08/23/24 1115)  Complications: No notable events documented.

## 2024-08-23 NOTE — Anesthesia Procedure Notes (Signed)
 Procedure Name: MAC Date/Time: 08/23/2024 1:23 PM  Performed by: Jolynn Mage, CRNAPre-anesthesia Checklist: Patient identified, Emergency Drugs available, Suction available, Timeout performed and Patient being monitored Patient Re-evaluated:Patient Re-evaluated prior to induction Oxygen Delivery Method: Simple face mask

## 2024-08-23 NOTE — Progress Notes (Signed)
 Orthopedic Tech Progress Note Patient Details:  Colleen Gray 20-Feb-1951 [id number]  CPM Right Knee CPM Right Knee: On Right Knee Flexion (Degrees): 0 Right Knee Extension (Degrees): 60  Post Interventions Patient Tolerated: Well Instructions Provided: Care of device Ortho Devices Type of Ortho Device: Bone foam zero knee Ortho Device/Splint Location: RLE Ortho Device/Splint Interventions: Ordered, Application   Post Interventions Patient Tolerated: Well Instructions Provided: Care of device  [name] Pac 08/23/2024, 6:36 PM

## 2024-08-23 NOTE — Interval H&P Note (Signed)
 History and Physical Interval Note:  08/23/2024 10:41 AM  Colleen Gray  has presented today for surgery, with the diagnosis of OA RIGHT KNEE.  The various methods of treatment have been discussed with the patient and family. After consideration of risks, benefits and other options for treatment, the patient has consented to  Procedure(s): ARTHROPLASTY, KNEE, TOTAL (Right) as a surgical intervention.  The patient's history has been reviewed, patient examined, no change in status, stable for surgery.  I have reviewed the patient's chart and labs.  Questions were answered to the patient's satisfaction.     [name]

## 2024-08-23 NOTE — Anesthesia Procedure Notes (Signed)
 Spinal  Patient location during procedure: OR Start time: 08/23/2024 1:24 PM End time: 08/23/2024 1:27 PM Reason for block: surgical anesthesia Staffing Performed: anesthesiologist  Anesthesiologist: Paul Lamarr BRAVO, MD Performed by: Paul Lamarr BRAVO, MD Authorized by: Paul Lamarr BRAVO, MD   Preanesthetic Checklist Completed: patient identified, IV checked, risks and benefits discussed, surgical consent, monitors and equipment checked, pre-op  evaluation and timeout performed Spinal Block Patient position: sitting Prep: DuraPrep and site prepped and draped Patient monitoring: continuous pulse ox, blood pressure and heart rate Approach: midline Location: L3-4 Injection technique: single-shot Needle Needle type: Pencan  Needle gauge: 24 G Needle length: 9 cm Assessment Events: CSF return Additional Notes Risks, benefits, and alternative discussed. Patient gave consent to procedure. Prepped and draped in sitting position. Patient sedated but responsive to voice. Clear CSF obtained after one needle pass. Positive terminal aspiration. No pain or paraesthesias with injection. Patient tolerated procedure well. Vital signs stable. LANEY Paul, MD

## 2024-08-23 NOTE — Anesthesia Procedure Notes (Signed)
 Anesthesia Regional Block: Adductor canal block   Pre-Anesthetic Checklist: , timeout performed,  Correct Patient, Correct Site, Correct Laterality,  Correct Procedure, Correct Position, site marked,  Risks and benefits discussed,  Pre-op  evaluation,  At surgeon's request and post-op pain management  Laterality: Right  Prep: Maximum Sterile Barrier Precautions used, chloraprep       Needles:  Injection technique: Single-shot  Needle Type: Echogenic Stimulator Needle     Needle Length: 9cm  Needle Gauge: 22     Additional Needles:   Procedures:,,,, ultrasound used (permanent image in chart),,    Narrative:  Start time: 08/23/2024 11:58 AM End time: 08/23/2024 12:01 PM Injection made incrementally with aspirations every 5 mL.  Performed by: Personally  Anesthesiologist: Paul Lamarr BRAVO, MD  Additional Notes: Risks, benefits, and alternative discussed. Patient gave consent for procedure. Patient prepped and draped in sterile fashion. Sedation administered, patient remains easily responsive to voice. Relevant anatomy identified with ultrasound guidance. Local anesthetic given in 5cc increments with no signs or symptoms of intravascular injection. No pain or paraesthesias with injection. Patient monitored throughout procedure with signs of LAST or immediate complications. Tolerated well. Ultrasound image placed in chart.  LANEY Paul, MD

## 2024-08-23 NOTE — Op Note (Signed)
 DATE OF SURGERY:  08/23/2024 TIME: 4:20 PM  PATIENT NAME:  Colleen Gray   AGE: 73 y.o.    PRE-OPERATIVE DIAGNOSIS:  OA RIGHT KNEE  POST-OPERATIVE DIAGNOSIS:  Same  PROCEDURE:  Procedure(s): ARTHROPLASTY, KNEE, TOTAL, RIGHT   SURGEON:  [name], MD   ASSISTANT:  Gerard Large, PA-C, he was present and scrubbed throughout the case, critical for completion in a timely fashion, and for retraction, instrumentation, and closure.   OPERATIVE IMPLANTS: [employer] Triathlon Posterior Stabilized.  Femur size 4, Tibia size 4, Patella size 32 3-peg oval button, with a 9 mm polyethylene insert.   PREOPERATIVE INDICATIONS:  Colleen Gray is a 73 y.o. year old female with end stage bone on bone degenerative arthritis of the knee who failed conservative treatment, including injections, antiinflammatories, activity modification, and assistive devices, and had significant impairment of their activities of daily living, and elected for Total Knee Arthroplasty.   The risks, benefits, and alternatives were discussed at length including but not limited to the risks of infection, bleeding, nerve injury, stiffness, blood clots, the need for revision surgery, cardiopulmonary complications, among others, and they were willing to proceed.   OPERATIVE DESCRIPTION:  The patient was brought to the operative room and placed in a supine position.  General anesthesia was administered.  IV antibiotics were given.  The lower extremity was prepped and draped in the usual sterile fashion.  Time out was performed.  The leg was elevated and exsanguinated and the tourniquet was inflated.  Anterior approach was performed.  The patella was everted and osteophytes were removed.  The anterior horn of the medial and lateral meniscus was removed.   The distal femur was opened with the drill and the intramedullary distal femoral cutting jig was utilized, set at 5 degrees resecting 8 mm off the distal femur.  Care  was taken to protect the collateral ligaments.  The distal femoral sizing jig was applied, taking care to avoid notching.  Then the 4-in-1 cutting jig was applied and the anterior and posterior femur was cut, along with the chamfer cuts.  All posterior osteophytes were removed.  The flexion gap was then measured and was symmetric with the extension gap.  Then the extramedullary tibial cutting jig was utilized making the appropriate cut using the anterior tibial crest as a reference building in appropriate posterior slope.  Care was taken during the cut to protect the medial and collateral ligaments.  The proximal tibia was removed along with the posterior horns of the menisci.  The PCL was sacrificed.    The extensor gap was measured and was approximately 9mm.    I completed the distal femoral preparation using the appropriate jig to prepare the box.  The patella was then measured, and cut with the saw.    The proximal tibia sized and prepared accordingly with the reamer and the punch, and then all components were trialed with the 9mm poly insert.  The knee was found to have excellent balance and full motion.    The above named components were then cemented into place and all excess cement was removed. Poly tibial piece and patella were inserted.  I was very happy with his stability and ROM  I performed a periarticular injection with marcaine  and toradol   The knee was easily taken through a range of motion and the patella tracked well and the knee irrigated copiously and the parapatellar and subcutaneous tissue closed with vicryl, and monocryl with steri strips for the skin.  The incision was dressed with sterile gauze and the tourniquet released and the patient was awakened and returned to the PACU in stable and satisfactory condition.  There were no complications.  Total tourniquet time was 90 minutes.   POSTOPERATIVE PLAN: post op Abx, DVT px: SCD's, TED's, Early ambulation and chemical  px

## 2024-08-23 NOTE — Discharge Instructions (Signed)

## 2024-08-23 NOTE — Consult Note (Signed)
 Renal Service Consult Note [facility] Colleen Gray Colleen Fret, MD  Patient: Colleen Gray Date: 08/23/2024 Requesting Physician: Dr. Beverley  Reason for Consult: ESRD pt s/p hip replacement surgery  HPI: The patient is a 73 y.o. year-old w/ PMH as below who presented for scheduled knee replacement surgery due to progressive OA of the R knee. The surgery was done this afternoon. The patient has esrd on HD TTS. Pt is to be admitted. We are asked to see for ESRD.    Pt seen in PACU. Pt is alert and w/o complaints. States she had dialysis yesterday (Monday) off schedule because of her surgery being scheduled today on her HD day. She is concerned about taking the public transport to her HD session on Thursday, also she says that there are stairs at home (#3 to be exact).    ROS - denies CP, no joint pain, no HA, no blurry vision, no rash, no diarrhea, no nausea/ vomiting   Past Medical History  Past Medical History:  Diagnosis Date   Anemia    Arthritis    Asthma    Bradycardia    Chronic renal failure, stage 4 (severe) (HCC) 2017   Complication of anesthesia    during anesthesia heart rate is low    Diabetes mellitus without complication (HCC)    Dysrhythmia    RBBB   Enlarged heart    Sees Dr. Ladona   ESRD on hemodialysis [facility])    Tues, Thurs, Sat - Dr. Jerrye   Heart murmur    due to rheumatic fever - Sees Dr Ladona   Hemorrhoid    History of kidney stones    History of rheumatic fever    with a heart murmer   Hypertension    Ovarian cyst 1985   removed   Pneumonia    Psoriasis    Sleep apnea    does not use CPAP (after weight loss no longer needed)   Past Surgical History  Past Surgical History:  Procedure Laterality Date   A/V SHUNT INTERVENTION Left 06/10/2024   Procedure: A/V SHUNT INTERVENTION;  Surgeon: Gretta Lonni PARAS, MD;  Location: [facility];  Service: Cardiovascular;  Laterality: Left;   A/V SHUNT INTERVENTION N/A 06/27/2024   Procedure: A/V  SHUNT INTERVENTION;  Surgeon: Magda Debby SAILOR, MD;  Location: [facility];  Service: Cardiovascular;  Laterality: N/A;   AV FISTULA PLACEMENT Left 05/24/2021   Procedure: LEFT ARM BRACHIOCEPHALIC ARTERIOVENOUS (AV) FISTULA CREATION;  Surgeon: Gretta Lonni PARAS, MD;  Location: [facility];  Service: Vascular;  Laterality: Left;   BASCILIC VEIN TRANSPOSITION Left 07/09/2022   Procedure: LEFT ARM  ARTERIOVENOUS FISTULA  CREATION;  Surgeon: Serene Gaile ORN, MD;  Location: [facility];  Service: Vascular;  Laterality: Left;   BASCILIC VEIN TRANSPOSITION Left 09/19/2022   Procedure: SECOND STAGE LEFT BASILIC VEIN TRANSPOSITION;  Surgeon: Serene Gaile ORN, MD;  Location: [facility];  Service: Vascular;  Laterality: Left;   BUNIONECTOMY WITH HAMMERTOE RECONSTRUCTION Bilateral 1980's; 1999; 2009    left; right; left 'w/plates and screws   COLONOSCOPY  2016   [facility] of colon cancer   COLONOSCOPY WITH PROPOFOL  N/A 01/23/2023   Procedure: COLONOSCOPY WITH PROPOFOL ;  Surgeon: Rollin Dover, MD;  Location: [facility];  Service: Gastroenterology;  Laterality: N/A;   DILATION AND CURETTAGE OF UTERUS  early 1980's   for missed AB   FISTULA SUPERFICIALIZATION Left 09/27/2021   Procedure: LEFT ARM ARTERIOVENOUS FISTULA REVISION AND SUPERFICIALIZATION;  Surgeon: Gretta Lonni  J, MD;  Location: [facility];  Service: Vascular;  Laterality: Left;   LAPAROSCOPIC GASTRIC BYPASS  2007   LAPAROSCOPIC OVARIAN CYSTECTOMY Right 1985   POLYPECTOMY  01/23/2023   Procedure: POLYPECTOMY;  Surgeon: Rollin Dover, MD;  Location: [facility];  Service: Gastroenterology;;   REVERSE SHOULDER ARTHROPLASTY Left 11/22/2015   Procedure: LEFT REVERSE SHOULDER ARTHROPLASTY;  Surgeon: Franky Pointer, MD;  Location: [facility];  Service: Orthopedics;  Laterality: Left;   REVERSE SHOULDER ARTHROPLASTY Right 05/13/2023   Procedure: REVERSE SHOULDER ARTHROPLASTY;  Surgeon: Cristy Bonner DASEN, MD;  Location: [facility];  Service: Orthopedics;  Laterality: Right;    ROTATOR CUFF REPAIR Right 2006   VENOUS ANGIOPLASTY  06/10/2024   Procedure: VENOUS ANGIOPLASTY;  Surgeon: Gretta Lonni PARAS, MD;  Location: [facility];  Service: Cardiovascular;;   VENOUS ANGIOPLASTY  06/27/2024   Procedure: VENOUS ANGIOPLASTY;  Surgeon: Magda Debby SAILOR, MD;  Location: [facility];  Service: Cardiovascular;;   Family History  Family History  Problem Relation Age of Onset   Colon cancer Mother 30   Diabetes Mother    Hypertension Mother    Diabetes Father    Hypertension Father    Colon cancer Maternal Grandmother 64   Diabetes Paternal Grandmother    Diabetes Paternal Grandfather    Breast cancer Neg Hx    Social History  reports that she has never smoked. She has never used smokeless tobacco. She reports that she does not drink alcohol and does not use drugs. Allergies  Allergies  Allergen Reactions   Nifedipine Swelling   Other     Surgery Glue caused blisters   Penicillins Rash   Home medications Prior to Admission medications   Medication Sig Start Date End Date Taking? Authorizing Provider  calcitRIOL  (ROCALTROL ) 0.5 MCG capsule Take 1 mcg by mouth Every Tuesday,Thursday,and Saturday with dialysis. 06/25/19  Yes [provider]  ferrous sulfate  325 (65 FE) MG tablet Take 325 mg by mouth every Monday, Wednesday, and Friday.   Yes [provider]  furosemide  (LASIX ) 80 MG tablet Take 1 tablet (80 mg total) by mouth 3 (three) times a week. 07/29/24  Yes Meng, Hao, PA  gabapentin  (NEURONTIN ) 100 MG capsule Take 100 mg by mouth at bedtime.   Yes [provider]  Multiple Vitamin (MULTIVITAMIN WITH MINERALS) TABS tablet Take 1 tablet by mouth daily.   Yes [provider]  nitroGLYCERIN  (NITROSTAT ) 0.4 MG SL tablet Place 1 tablet (0.4 mg total) under the tongue every 5 (five) minutes as needed for chest pain. 08/09/24  Yes Meng, Hao, PA  Omega 3 1000 MG CAPS Take 1,000 mg by mouth daily.   Yes [provider]  sevelamer   carbonate (RENVELA ) 800 MG tablet Take 1,600 mg by mouth 3 (three) times daily with meals. 04/21/17  Yes [provider]  vitamin B-12 (CYANOCOBALAMIN ) 1000 MCG tablet Take 1,000 mcg by mouth daily.   Yes [provider]  vitamin C  (ASCORBIC ACID) 500 MG tablet Take 500 mg by mouth daily.   Yes [provider]  albuterol  (VENTOLIN  HFA) 108 (90 Base) MCG/ACT inhaler Inhale 1-2 puffs into the lungs every 6 (six) hours as needed for wheezing or shortness of breath.    [provider]  Blood Glucose Monitoring Suppl (GLUCOCOM BLOOD GLUCOSE MONITOR) DEVI Use daily to check blood sugar. 06/29/18   [provider]     Vitals:   08/23/24 1210 08/23/24 1600 08/23/24 1615 08/23/24 1630  BP: (!) 99/48 ROLLEN)  106/55 (!) 102/55 (!) 92/46  Pulse: (!) 52 81 62 (!) 52  Resp: 12 14 12 12   Temp:      TempSrc:      SpO2: 100% 100% 97% 94%  Weight:      Height:       Exam Gen alert, no distress Sclera anicteric, throat clear  No jvd or bruits Chest clear bilat to bases RRR no MRG Abd soft ntnd no mass or ascites +bs GU deferred MS no joint effusions or deformity Ext no pitting LE or UE edema, no other edema Neuro is alert, Ox 3 , nf    LUA AVF+bruit   Home bp meds: Lasix  80mg  three times per week    OP HD: G-O TTS 4h  B400   89.3kg   2K bath   AVF   Heparin  4000 Last OP HD 9/01, post wt 89.3kg Recent Hb 11-12, no esa's    Assessment/ Plan: S/P R knee replacement surgery: on 9/02, per orthopedics ESRD: on HD TTS. Pt had outpatient HD Monday (yesterday) in preparation for her scheduled surgery on her HD day (today). She does not need another dialysis until Thursday (9/04), unless some acute need develops (^K+, vol, etc...) while here.  Will follow closely. Also, esrd pt's don't require post-op IVFs in general, so I have dc'd the IVF's at 40 cc/hr.  BP: her BP's in outpatient setting are at the low end of normal, SBP 105- 125 range. No midodrine, no bp  lowering meds.  Volume: gets to her dry wt, euvolemic on exam today. Follow.  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 No esa needs. Follow.     Myer Fret  MD [facility] 08/23/2024, 4:50 PM  Recent Labs  Lab 08/23/24 1139  HGB 10.9*  CREATININE 5.30*  K 4.1   Inpatient medications:  bupivacaine  liposome  20 mL Other Once    sodium chloride  Stopped (08/23/24 1602)   droperidol , fentaNYL  (SUBLIMAZE ) injection, oxyCODONE  **OR** oxyCODONE 

## 2024-08-24 ENCOUNTER — Other Ambulatory Visit (HOSPITAL_COMMUNITY): Payer: Self-pay

## 2024-08-24 ENCOUNTER — Telehealth (HOSPITAL_COMMUNITY): Payer: Self-pay | Admitting: Pharmacy Technician

## 2024-08-24 ENCOUNTER — Encounter (HOSPITAL_COMMUNITY): Payer: Self-pay

## 2024-08-24 DIAGNOSIS — Z8249 Family history of ischemic heart disease and other diseases of the circulatory system: Secondary | ICD-10-CM | POA: Diagnosis not present

## 2024-08-24 DIAGNOSIS — I120 Hypertensive chronic kidney disease with stage 5 chronic kidney disease or end stage renal disease: Secondary | ICD-10-CM | POA: Diagnosis present

## 2024-08-24 DIAGNOSIS — M1711 Unilateral primary osteoarthritis, right knee: Secondary | ICD-10-CM | POA: Diagnosis present

## 2024-08-24 DIAGNOSIS — Z888 Allergy status to other drugs, medicaments and biological substances status: Secondary | ICD-10-CM | POA: Diagnosis not present

## 2024-08-24 DIAGNOSIS — Z96611 Presence of right artificial shoulder joint: Secondary | ICD-10-CM | POA: Diagnosis present

## 2024-08-24 DIAGNOSIS — D631 Anemia in chronic kidney disease: Secondary | ICD-10-CM | POA: Diagnosis present

## 2024-08-24 DIAGNOSIS — Z79899 Other long term (current) drug therapy: Secondary | ICD-10-CM | POA: Diagnosis not present

## 2024-08-24 DIAGNOSIS — M25761 Osteophyte, right knee: Secondary | ICD-10-CM | POA: Diagnosis present

## 2024-08-24 DIAGNOSIS — Z880 Allergy status to penicillin: Secondary | ICD-10-CM | POA: Diagnosis not present

## 2024-08-24 DIAGNOSIS — N186 End stage renal disease: Secondary | ICD-10-CM | POA: Diagnosis present

## 2024-08-24 DIAGNOSIS — E1122 Type 2 diabetes mellitus with diabetic chronic kidney disease: Secondary | ICD-10-CM | POA: Diagnosis present

## 2024-08-24 DIAGNOSIS — Z91048 Other nonmedicinal substance allergy status: Secondary | ICD-10-CM | POA: Diagnosis not present

## 2024-08-24 DIAGNOSIS — Z833 Family history of diabetes mellitus: Secondary | ICD-10-CM | POA: Diagnosis not present

## 2024-08-24 DIAGNOSIS — Z992 Dependence on renal dialysis: Secondary | ICD-10-CM | POA: Diagnosis not present

## 2024-08-24 DIAGNOSIS — Z96612 Presence of left artificial shoulder joint: Secondary | ICD-10-CM | POA: Diagnosis present

## 2024-08-24 DIAGNOSIS — Z9884 Bariatric surgery status: Secondary | ICD-10-CM | POA: Diagnosis not present

## 2024-08-24 LAB — HEPATITIS B SURFACE ANTIGEN: Hepatitis B Surface Ag: NONREACTIVE

## 2024-08-24 MED ORDER — CHLORHEXIDINE GLUCONATE CLOTH 2 % EX PADS
6.0000 | MEDICATED_PAD | Freq: Every day | CUTANEOUS | Status: DC
  Administered 2024-08-25 – 2024-08-26 (×2): 6 via TOPICAL

## 2024-08-24 NOTE — Plan of Care (Signed)

## 2024-08-24 NOTE — Progress Notes (Signed)
 Subjective: Patient reports pain as mild to moderate.  Tolerating diet.  Urinating.   No CP, SOB.  Has mobilized some OOB with PT.   Objective:   VITALS:   Vitals:   08/23/24 2052 08/24/24 0027 08/24/24 0455 08/24/24 0747  BP: (!) 124/52 135/63 (!) 91/47 115/67  Pulse:  83 71 62  Resp: 16 14 15 19   Temp: 97.8 F (36.6 C) 98 F (36.7 C) 98.5 F (36.9 C) 98.1 F (36.7 C)  TempSrc:  Oral Oral Oral  SpO2: 97% 96% 100% 98%  Weight:      Height:          Latest Ref Rng & Units 08/23/2024    9:51 PM 08/23/2024   11:39 AM 08/15/2024   10:31 AM  CBC  WBC 4.0 - 10.5 K/uL 10.5   4.7   Hemoglobin 12.0 - 15.0 g/dL 9.8  89.0  88.6   Hematocrit 36.0 - 46.0 % 30.5  32.0  35.9   Platelets 150 - 400 K/uL 179   233       Latest Ref Rng & Units 08/23/2024    9:51 PM 08/23/2024   11:39 AM 08/15/2024   10:31 AM  BMP  Glucose 70 - 99 mg/dL 749  891  876   BUN 8 - 23 mg/dL 36  35  41   Creatinine 0.44 - 1.00 mg/dL 4.20  4.69  3.13   Sodium 135 - 145 mmol/L 139  138  141   Potassium 3.5 - 5.1 mmol/L 4.4  4.1  3.9   Chloride 98 - 111 mmol/L 99  99  101   CO2 22 - 32 mmol/L 24   29   Calcium  8.9 - 10.3 mg/dL 8.4   8.9    Intake/Output      09/02 0701 09/03 0700 09/03 0701 09/04 0700   I.V. (mL/kg) 400 (4.5)    IV Piggyback 450    Total Intake(mL/kg) 850 (9.6)    Blood 50    Total Output 50    Net +800            Physical Exam: General: NAD. In bedside chair Resp: No increased wob Cardio: regular rate and rhythm ABD soft Neurologically intact MSK Neurovascularly intact Sensation intact distally Intact pulses distally Dorsiflexion/Plantar flexion intact Incision: dressing C/D/I Very limited knee ROM   Assessment: 1 Day Post-Op  S/P Procedure(s) (LRB): ARTHROPLASTY, KNEE, TOTAL, RIGHT (Right) by Dr. Evalene BIRCH. Murphy on 08/23/24  Principal Problem:   S/P total knee arthroplasty, right   Plan: Seen by nephrology who felt she didn't need dialysis today and instead can  get back on her usual schedule tomorrow  Advance diet Up with therapy Incentive Spirometry Elevate and Apply ice  Weightbearing: WBAT RLE Insicional and dressing care: Dressings left intact until follow-up and Reinforce dressings as needed Orthopedic device(s): CPM Showering: Keep dressing dry VTE prophylaxis: Eliquis  2.5mg  bid x 30 days , SCDs, ambulation Pain control: PRN Follow - up plan: 2 weeks Contact information:  Evalene Chancy MD, Gerard Large PA-C  Dispo: Home with HHPT via [facility] when passes PT evaluation and medically stable. Apparently patient no longer has stable social support and someone to stay in the home with her for the first week post-op which is CONTRADICTORY to what she told us  in office. The only reason we agreed to move forward with surgery was because she told us  her aunt was going to stay with her. This now poses a  severe issue and will need help from [facility] on managing safe discharge.     Gerard CHRISTELLA Large, PA-C Office [phone] 08/24/2024, 1:19 PM

## 2024-08-24 NOTE — Care Management Important Message (Signed)
 Important Message  Patient Details  Name: Colleen Gray MRN: [id number] Date of Birth: March 28, 1951   Important Message Given:        [name] Sharps 08/24/2024, 1:48 PM

## 2024-08-24 NOTE — Progress Notes (Signed)
 [facility] Progress Note  Subjective:    Vitals:   08/23/24 2052 08/24/24 0027 08/24/24 0455 08/24/24 0747  BP: (!) 124/52 135/63 (!) 91/47 115/67  Pulse:  83 71 62  Resp: 16 14 15 19   Temp: 97.8 F (36.6 C) 98 F (36.7 C) 98.5 F (36.9 C) 98.1 F (36.7 C)  TempSrc:  Oral Oral Oral  SpO2: 97% 96% 100% 98%  Weight:      Height:        Exam: Gen alert, no distress No jvd or bruits Chest clear bilat to bases RRR no MRG Abd soft ntnd no mass or ascites +bs Ext no pitting LE  edema Neuro is alert, Ox 3 , nf    LUA AVF+bruit    Home bp meds: Lasix  80mg  three times per week      OP HD: G-O TTS 4h  B400   89.3kg   2K bath   AVF   Heparin  4000 Last OP HD 9/01, post wt 89.3kg Recent Hb 11-12, no esa's       Assessment/ Plan: S/P R knee replacement surgery: 9/02, per orthopedics ESRD: on HD TTS. Pt had outpatient HD Monday (off schedule) prior to surgery yesterday. Next HD tomorrow.  BP: her BP's outpatient are low-normal. No midodrine, no bp lowering meds. BP's stable.  Volume: 1kg under dry wt, euvolemic on exam. Follow.  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 No esa needs. Follow.      Myer Fret MD  [facility] 08/24/2024, 2:32 PM  Recent Labs  Lab 08/23/24 1139 08/23/24 2151  HGB 10.9* 9.8*  ALBUMIN   --  3.4*  CALCIUM   --  8.4*  CREATININE 5.30* 5.79*  K 4.1 4.4   No results for input(s): IRON, TIBC, FERRITIN in the last 168 hours. Inpatient medications:  acetaminophen   1,000 mg Oral Q6H   apixaban   2.5 mg Oral Q12H   cyanocobalamin   1,000 mcg Oral Daily   docusate sodium   100 mg Oral BID   gabapentin   100 mg Oral QHS   multivitamin with minerals  1 tablet Oral Daily   pantoprazole   40 mg Oral Daily   sevelamer  carbonate  1,600 mg Oral TID WC    [START ON 08/25/2024] acetaminophen , albuterol , alum & mag hydroxide-simeth, bisacodyl , diphenhydrAMINE , HYDROmorphone  (DILAUDID ) injection, magnesium  citrate,  menthol -cetylpyridinium **OR** phenol, methocarbamol  **OR** methocarbamol  (ROBAXIN ) injection, metoCLOPramide  **OR** metoCLOPramide  (REGLAN ) injection, ondansetron  **OR** ondansetron  (ZOFRAN ) IV, oxyCODONE , oxyCODONE , polyethylene glycol

## 2024-08-24 NOTE — Evaluation (Signed)
 Physical Therapy Evaluation Patient Details Name: Colleen Gray MRN: [id number] DOB: [birth year] Today's Date: 08/24/2024  History of Present Illness  Colleen Gray is a 73 y.o. female who presented 08/23/24 for elective R TKA. PMHx: HTN, T2DM, ESRF (HD T/Th/Sat), OA, and asthma.  Clinical Impression  Pt admitted with above diagnosis. PTA, pt was modI for functional mobility using a RW and modI for ADLs/IADLs. She lives alone in a one story townhouse with 3 STE. Pt currently with functional limitations due to the deficits listed below (see PT Problem List). She required supervision for bed mobility, CGA for transfers using RW, and CGA for gait using RW. Educated pt on knee precautions and R TKA HEP. Provide her with handout. Encouraged frequent mobilization with staff assistance and use of CPM. Pt will benefit from acute skilled PT to increase their independence and safety with mobility to allow discharge.      If plan is discharge home, recommend the following: A little help with walking and/or transfers;A little help with bathing/dressing/bathroom;Assistance with cooking/housework;Assist for transportation;Help with stairs or ramp for entrance   Can travel by private vehicle        Equipment Recommendations None recommended by PT (Pt already has DME)  Recommendations for Other Services       Functional Status Assessment Patient has had a recent decline in their functional status and demonstrates the ability to make significant improvements in function in a reasonable and predictable amount of time.     Precautions / Restrictions Precautions Precautions: Knee;Fall Precaution Booklet Issued: Yes (comment) Recall of Precautions/Restrictions: Intact Precaution/Restrictions Comments: Reviewed positioning considerations, use of zero degree bone foam. Encouraged use of CPM for ~2 hours at a time while supine in bed. Required Braces or Orthoses: Knee Immobilizer - Right Knee Immobilizer  - Right: On at all times;On except when in CPM;Other (comment) (Until nerve block wear off) Restrictions Weight Bearing Restrictions Per Provider Order: Yes RLE Weight Bearing Per Provider Order: Weight bearing as tolerated      Mobility  Bed Mobility Overal bed mobility: Needs Assistance Bed Mobility: Supine to Sit     Supine to sit: Supervision     General bed mobility comments: Pt sat up on L side of bed with increased time. Cues for sequencing. HOB flat and no use of bed rails. Pt brought BLE off EOB, elevated trunk, and scooted fwd til feet flat.    Transfers Overall transfer level: Needs assistance Equipment used: Rolling walker (2 wheels) Transfers: Sit to/from Stand, Bed to chair/wheelchair/BSC Sit to Stand: Contact guard assist, From elevated surface   Step pivot transfers: Contact guard assist, From elevated surface       General transfer comment: Pt stood from raised bed height to simulate home environment. Cued proper hand placement using RW. Educated pt on use of momentum to power up. Stood with CGA. Increased time to bring LUE onto RW. Good eccentric control with sitting.    Ambulation/Gait Ambulation/Gait assistance: Contact guard assist Gait Distance (Feet): 50 Feet Assistive device: Rolling walker (2 wheels) Gait Pattern/deviations: Step-to pattern, Decreased stride length, Antalgic, Trunk flexed, Wide base of support Gait velocity: decreased Gait velocity interpretation: <1.31 ft/sec, indicative of household ambulator   General Gait Details: Pt ambulated with short slow steps. She demonstrated a forward lean and fixed gaze on feet. Cues for proximity to RW, upright posture, and forward gaze. Pt maintained RLE in extension intermittently circumducting to advance leg. Cues for increased hip/knee flex with limited ability to correct  noted. Pt acheived heel strike bilaterally. No LOB.  Stairs            Wheelchair Mobility     Tilt Bed    Modified  Rankin (Stroke Patients Only)       Balance Overall balance assessment: Needs assistance Sitting-balance support: Single extremity supported, No upper extremity supported, Feet supported Sitting balance-Leahy Scale: Good Sitting balance - Comments: Pt sat EOB with supervision. She was able to reach outside her BOS in long sitting and unhook the strap around her knee of the CPM but not her foot. She was able to reach outside her BOS in sitting adjusting her socks. No LOB.   Standing balance support: Bilateral upper extremity supported, During functional activity, Reliant on assistive device for balance Standing balance-Leahy Scale: Poor Standing balance comment: Pt dependent on RW.                             Pertinent Vitals/Pain Pain Assessment Pain Assessment: 0-10 Pain Score: 4  Pain Location: R Knee Pain Descriptors / Indicators: Operative site guarding, Grimacing, Discomfort, Aching Pain Intervention(s): Monitored during session, Limited activity within patient's tolerance, Repositioned    Home Living Family/patient expects to be discharged to:: Private residence Living Arrangements: Alone Available Help at Discharge: Family;Friend(s);Available PRN/intermittently Type of Home: House [facility]) Home Access: Stairs to enter Entrance Stairs-Rails: Right Entrance Stairs-Number of Steps: 3   Home Layout: One level Home Equipment: Tub bench;Cane - quad;Cane - single point;Rollator (4 wheels);BSC/3in1;Rolling Environmental consultant (2 wheels);Wheelchair - manual;Wheelchair - power;Hand held shower head;Other (comment) (recliner lift chair) Additional Comments: Pt reports the bed she sleeps in is ~21 high & the bed the CPM is placed on is a standard height but twin mattress    Prior Function Prior Level of Function : Independent/Modified Independent;Driving             Mobility Comments: Ambulates using RW with seat attachment and/or quad cane. Pt reports she has to hold onto  something (push/pull) in order to stand. Denies falls in the past 67mo. ADLs Comments: ModI for ADLs/IADLs. Uses the tub bench to get in/out of the shower. Sits to bath. Retired. Relies on bus transportation to/from dialysis.     Extremity/Trunk Assessment   Upper Extremity Assessment Upper Extremity Assessment: Overall WFL for tasks assessed;Right hand dominant    Lower Extremity Assessment Lower Extremity Assessment: RLE deficits/detail (LLE overall WFL for tasks assessed) RLE Deficits / Details: Pt POD 1 s/p TKA. Decreased hip flex/ext. WFL hip abd/add.  Knee AROM limited. Pt demonstrated ~20deg flex and appears to be 0 deg of ext. Pt performed 5 straight leg raises without extension lag. Ankle AROM/strength [facility]. Grossly 3-/5 strength. RLE: Unable to fully assess due to pain RLE Sensation: WNL RLE Coordination: decreased gross motor    Cervical / Trunk Assessment Cervical / Trunk Assessment: Other exceptions Cervical / Trunk Exceptions: Thoracic Rounding  Communication   Communication Communication: No apparent difficulties    Cognition Arousal: Alert Behavior During Therapy: WFL for tasks assessed/performed   PT - Cognitive impairments: No apparent impairments                       PT - Cognition Comments: Pt A,Ox4 Following commands: Intact       Cueing Cueing Techniques: Verbal cues, Gestural cues, Visual cues     General Comments General comments (skin integrity, edema, etc.): Educated pt on R TKA HEP. Demonstrated  each exercises. Provided her with handout. Recommended frequent mobilization, use of CPM, and completion on HEP while admitted. Introduced education on the level of assistance she will likely need. Pt reported she will reach out to her support system to see if someone can stay with her initially for the first couple of days once she returns home.    Exercises Total Joint Exercises Ankle Circles/Pumps: Supine, Both, AROM, 5 reps Quad Sets: Supine,  Both, AROM, 5 reps Heel Slides: Supine, Right, AROM, 5 reps Hip ABduction/ADduction: Supine, Right, AROM, 5 reps   Assessment/Plan    PT Assessment Patient needs continued PT services  PT Problem List Decreased strength;Decreased range of motion;Decreased activity tolerance;Decreased balance;Decreased mobility       PT Treatment Interventions DME instruction;Gait training;Stair training;Functional mobility training;Therapeutic activities;Therapeutic exercise;Balance training;Patient/family education    PT Goals (Current goals can be found in the Care Plan section)  Acute Rehab PT Goals Patient Stated Goal: Return Home PT Goal Formulation: With patient Time For Goal Achievement: 09/07/24 Potential to Achieve Goals: Good    Frequency 7X/week     Co-evaluation               AM-PAC PT 6 Clicks Mobility  Outcome Measure Help needed turning from your back to your side while in a flat bed without using bedrails?: A Little Help needed moving from lying on your back to sitting on the side of a flat bed without using bedrails?: A Little Help needed moving to and from a bed to a chair (including a wheelchair)?: A Little Help needed standing up from a chair using your arms (e.g., wheelchair or bedside chair)?: A Little Help needed to walk in hospital room?: A Little Help needed climbing 3-5 steps with a railing? : A Lot 6 Click Score: 17    End of Session Equipment Utilized During Treatment: Gait belt Activity Tolerance: Patient tolerated treatment well Patient left: in chair;with call bell/phone within reach;with chair alarm set Nurse Communication: Mobility status PT Visit Diagnosis: Difficulty in walking, not elsewhere classified (R26.2);Other abnormalities of gait and mobility (R26.89);Unsteadiness on feet (R26.81)    Time: 9257-9165 PT Time Calculation (min) (ACUTE ONLY): 52 min   Charges:   PT Evaluation $PT Eval Moderate Complexity: 1 Mod PT Treatments $Gait  Training: 8-22 mins $Therapeutic Exercise: 8-22 mins PT General Charges $$ ACUTE PT VISIT: 1 Visit         Randall SAUNDERS, PT, DPT Acute Rehabilitation Services Office: [phone] Secure Chat Preferred  Delon CHRISTELLA Callander 08/24/2024, 9:46 AM

## 2024-08-24 NOTE — Progress Notes (Signed)
 Physical Therapy Treatment Patient Details Name: Colleen Gray MRN: [id number] DOB: June 20, 1951 Today's Date: 08/24/2024   History of Present Illness Colleen Gray is a 73 y.o. female who presented 08/23/24 for elective R TKA. PMHx: HTN, T2DM, ESRF (HD T/Th/Sat), OA, and asthma.    PT Comments  Pt greeted supine in bed, pleasant and agreeable to PT session. She performed RLE exercises as a warm-up prior to gait. Pt is lacking right knee flexion achieving ~10deg AROM and ~30deg PROM assessed while supine in bed. She performed standing marches and standing butt kicks to promote flexion. Pt advanced gait distance, ambulating ~137ft using RW with CGA. She demonstrated multiple gait abnormalities with good ability to adapt/correct with multi-modal cueing. Patient needs to practice stairs next session. Will continue to follow acutely and advance appropriately.      If plan is discharge home, recommend the following: A little help with walking and/or transfers;A little help with bathing/dressing/bathroom;Assistance with cooking/housework;Assist for transportation;Help with stairs or ramp for entrance   Can travel by private vehicle        Equipment Recommendations  None recommended by PT    Recommendations for Other Services       Precautions / Restrictions Precautions Precautions: Knee;Fall Precaution Booklet Issued: Yes (comment) Recall of Precautions/Restrictions: Intact Required Braces or Orthoses: Knee Immobilizer - Right Knee Immobilizer - Right: On at all times;On except when in CPM;Other (comment) (Until nerve block wear off) Restrictions Weight Bearing Restrictions Per Provider Order: Yes RLE Weight Bearing Per Provider Order: Weight bearing as tolerated     Mobility  Bed Mobility Overal bed mobility: Needs Assistance Bed Mobility: Supine to Sit, Sit to Supine     Supine to sit: Supervision, HOB elevated, Used rails Sit to supine: Supervision, HOB elevated, Used  rails   General bed mobility comments: Pt took increased time but managed BLEs and trunk with use of bedrail.    Transfers Overall transfer level: Needs assistance Equipment used: Rolling walker (2 wheels) Transfers: Sit to/from Stand Sit to Stand: From elevated surface, Contact guard assist           General transfer comment: Pt stood from raised bed height. Cued increased knee flex bilat, increase fwd lean, and use of momentum. Powered up with CGA. Good eccentric control with sitting.    Ambulation/Gait Ambulation/Gait assistance: Contact guard assist Gait Distance (Feet): 150 Feet Assistive device: Rolling walker (2 wheels) Gait Pattern/deviations: Step-through pattern, Decreased stride length, Trunk flexed, Wide base of support, Antalgic Gait velocity: decreased Gait velocity interpretation: <1.31 ft/sec, indicative of household ambulator   General Gait Details: Pt demonstrated heel strike bilaterally and limited push off on R foot. She is lacking hip flex and knee flex during the swing phase. Pt slightly circumducts RLE to advance fwd reporting rubbing her LLE. Pt maintained upright posture and good proximity to RW. Cues for increased gait speed to facilitate a smooth gait pattern. Pt is really focused and receptive to cues for gait corrections, but will become very slow and fragmented attempting to perfect movement.   Stairs             Wheelchair Mobility     Tilt Bed    Modified Rankin (Stroke Patients Only)       Balance Overall balance assessment: Needs assistance Sitting-balance support: Single extremity supported, No upper extremity supported, Feet supported Sitting balance-Leahy Scale: Good     Standing balance support: Bilateral upper extremity supported, During functional activity, Reliant on assistive device for  balance Standing balance-Leahy Scale: Poor Standing balance comment: Pt dependent on RW.                             Communication Communication Communication: No apparent difficulties  Cognition Arousal: Alert Behavior During Therapy: WFL for tasks assessed/performed   PT - Cognitive impairments: No apparent impairments                         Following commands: Intact      Cueing Cueing Techniques: Verbal cues, Tactile cues  Exercises Total Joint Exercises Quad Sets: Supine, Both, AROM, 10 reps (hold for 3 seconds before relaxing) Heel Slides: Supine, Right, AAROM, 15 reps Straight Leg Raises: Supine, Right, AROM, 10 reps Knee Flexion: Standing, Right, AROM, 15 reps (butt kicks) Goniometric ROM: Knee Flexion (supine): AROM ~10deg, PROM ~30deg; Knee Extension (supine): AROM ~0deg. Marching in Standing: Standing, Right, AROM, 15 reps Standing Hip Extension: Standing, Right, AROM, 10 reps    General Comments General comments (skin integrity, edema, etc.): VSS on RA. Pt reports using the CPM three times today.      Pertinent Vitals/Pain Pain Assessment Pain Assessment: Faces Faces Pain Scale: Hurts even more Pain Location: R Knee Pain Descriptors / Indicators: Operative site guarding, Grimacing, Discomfort, Aching Pain Intervention(s): Monitored during session, Limited activity within patient's tolerance, Repositioned    Home Living                          Prior Function            PT Goals (current goals can now be found in the care plan section) Acute Rehab PT Goals Patient Stated Goal: Knee to bend more. Progress towards PT goals: Progressing toward goals    Frequency    7X/week      PT Plan      Co-evaluation              AM-PAC PT 6 Clicks Mobility   Outcome Measure  Help needed turning from your back to your side while in a flat bed without using bedrails?: A Little Help needed moving from lying on your back to sitting on the side of a flat bed without using bedrails?: A Little Help needed moving to and from a bed to a chair  (including a wheelchair)?: A Little Help needed standing up from a chair using your arms (e.g., wheelchair or bedside chair)?: A Little Help needed to walk in hospital room?: A Little Help needed climbing 3-5 steps with a railing? : A Lot 6 Click Score: 17    End of Session Equipment Utilized During Treatment: Gait belt Activity Tolerance: Patient tolerated treatment well Patient left: in bed;with call bell/phone within reach;with bed alarm set Nurse Communication: Mobility status PT Visit Diagnosis: Difficulty in walking, not elsewhere classified (R26.2);Other abnormalities of gait and mobility (R26.89);Unsteadiness on feet (R26.81)     Time: 8369-8290 PT Time Calculation (min) (ACUTE ONLY): 39 min  Charges:    $Gait Training: 23-37 mins $Therapeutic Exercise: 8-22 mins PT General Charges $$ ACUTE PT VISIT: 1 Visit                     Randall SAUNDERS, PT, DPT Acute Rehabilitation Services Office: [phone] Secure Chat Preferred  Delon CHRISTELLA Callander 08/24/2024, 5:33 PM

## 2024-08-24 NOTE — Telephone Encounter (Signed)
 Patient Product/process development scientist completed.    The patient is insured through [employer]. Patient has Medicare and is not eligible for a copay card, but may be able to apply for patient assistance or Medicare RX Payment Plan (Patient Must reach out to their plan, if eligible for payment plan), if available.    Ran test claim for Eliquis 2.5 mg and the current 30 day co-pay is $35.00.   This test claim was processed through [facility]- copay amounts may vary at other pharmacies due to pharmacy/plan contracts, or as the patient moves through the different stages of their insurance plan.     Reyes Sharps, CPHT Pharmacy Technician III Certified Patient Advocate [facility] Patient Advocate Team Direct Number: [phone]  Fax: [phone]

## 2024-08-24 NOTE — Progress Notes (Signed)
 Pt receives out-pt HD at [facility] on TTS 10:25 am chair time. Will assist as needed.   Randine Mungo Dialysis Navigator [phone]

## 2024-08-25 ENCOUNTER — Encounter (HOSPITAL_COMMUNITY): Payer: Self-pay | Admitting: Orthopedic Surgery

## 2024-08-25 ENCOUNTER — Telehealth: Payer: Self-pay | Admitting: Podiatry

## 2024-08-25 LAB — CBC
HCT: 30 % — ABNORMAL LOW (ref 36.0–46.0)
Hemoglobin: 9.7 g/dL — ABNORMAL LOW (ref 12.0–15.0)
MCH: 31.1 pg (ref 26.0–34.0)
MCHC: 32.3 g/dL (ref 30.0–36.0)
MCV: 96.2 fL (ref 80.0–100.0)
Platelets: 192 K/uL (ref 150–400)
RBC: 3.12 MIL/uL — ABNORMAL LOW (ref 3.87–5.11)
RDW: 13.5 % (ref 11.5–15.5)
WBC: 8.3 K/uL (ref 4.0–10.5)
nRBC: 0 % (ref 0.0–0.2)

## 2024-08-25 LAB — RENAL FUNCTION PANEL
Albumin: 3.1 g/dL — ABNORMAL LOW (ref 3.5–5.0)
Anion gap: 17 — ABNORMAL HIGH (ref 5–15)
BUN: 53 mg/dL — ABNORMAL HIGH (ref 8–23)
CO2: 24 mmol/L (ref 22–32)
Calcium: 7.9 mg/dL — ABNORMAL LOW (ref 8.9–10.3)
Chloride: 97 mmol/L — ABNORMAL LOW (ref 98–111)
Creatinine, Ser: 8.39 mg/dL — ABNORMAL HIGH (ref 0.44–1.00)
GFR, Estimated: 5 mL/min — ABNORMAL LOW (ref >60)
Glucose, Bld: 162 mg/dL — ABNORMAL HIGH (ref 70–99)
Phosphorus: 4.4 mg/dL (ref 2.5–4.6)
Potassium: 3.9 mmol/L (ref 3.5–5.1)
Sodium: 138 mmol/L (ref 135–145)

## 2024-08-25 LAB — HEPATITIS B SURFACE ANTIBODY, QUANTITATIVE: Hep B S AB Quant (Post): 7132 m[IU]/mL

## 2024-08-25 MED ORDER — PENTAFLUOROPROP-TETRAFLUOROETH EX AERO
INHALATION_SPRAY | CUTANEOUS | Status: AC
  Filled 2024-08-25: qty 30

## 2024-08-25 MED ORDER — OXYCODONE HCL 5 MG PO TABS
ORAL_TABLET | ORAL | Status: AC
  Filled 2024-08-25: qty 2

## 2024-08-25 MED ORDER — PENTAFLUOROPROP-TETRAFLUOROETH EX AERO
1.0000 | INHALATION_SPRAY | CUTANEOUS | Status: DC | PRN
  Administered 2024-08-25: 1 via TOPICAL

## 2024-08-25 NOTE — Progress Notes (Signed)
 Pt completed dialysis treatment with 700 ml  UF off . Oxycodone  10 mg po given during HD.  08/25/24 1200  Vitals  Temp 98 F (36.7 C)  Temp Source Oral  BP (!) 147/61  BP Location Right Arm  BP Method Automatic  Patient Position (if appropriate) Lying  Pulse Rate 77  ECG Heart Rate 78  Resp 13  Weight 91.3 kg  During Treatment Monitoring  Blood Flow Rate (mL/min) 399 mL/min  Arterial Pressure (mmHg) -182.21 mmHg  Venous Pressure (mmHg) 269.48 mmHg  TMP (mmHg) 0 mmHg  Ultrafiltration Rate (mL/min) 458 mL/min  Dialysate Flow Rate (mL/min) 299 ml/min  Duration of HD Treatment -hour(s) 2.5 hour(s)  Cumulative Fluid Removed (mL) per Treatment  640.31  HD Safety Checks Performed Yes  Intra-Hemodialysis Comments Progressing as prescribed  Post Treatment  Dialyzer Clearance Lightly streaked  Hemodialysis Intake (mL) 0 mL  Liters Processed 72  Fluid Removed (mL) 0.07 mL  Tolerated HD Treatment Yes  Post-Hemodialysis Comments see notes.  AVG/AVF Arterial Site Held (minutes) 5 minutes  AVG/AVF Venous Site Held (minutes) 5 minutes  Fistula / Graft Left Upper arm Arteriovenous fistula  Placement Date/Time: 07/09/22 1000   Placed prior to admission: No  Orientation: Left  Access Location: Upper arm  Access Type: Arteriovenous fistula  Site Condition No complications  Fistula / Graft Assessment Present;Thrill;Bruit  Status Deaccessed  Needle Size 15  Drainage Description None

## 2024-08-25 NOTE — Progress Notes (Signed)
 [facility] Progress Note  Subjective:  Seen in HD No new c/o  Vitals:   08/25/24 1004 08/25/24 1034 08/25/24 1104 08/25/24 1130  BP: 124/65 124/63 (!) 142/65 127/64  Pulse: 74 77 77 81  Resp: 13 17 14 14   Temp:      TempSrc:      SpO2: 96% 97% 97% 100%  Weight:      Height:        Exam: Gen alert, no distress No jvd or bruits Chest clear bilat to bases RRR no MRG Abd soft ntnd no mass or ascites +bs Ext no pitting LE  edema Neuro is alert, Ox 3 , nf    LUA AVF+bruit    Home bp meds: Lasix  80mg  three times per week      OP HD: [facility] TTS 4h  B400   89.3kg   2K bath   AVF   Heparin  4000 Last OP HD 9/01, post wt 89.3kg Recent Hb 11-12, no esa's       Assessment/ Plan: S/P R knee replacement surgery: 9/02, per orthopedics ESRD: on HD TTS. HD today.  BP: her BP's outpatient are low-normal. No midodrine, no bp lowering meds. BP's stable here Volume: 1kg under dry wt, euvolemic on exam. Follow.  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 Follow.      Myer Fret MD  [facility] 08/25/2024, 11:59 AM  Recent Labs  Lab 08/23/24 2151 08/25/24 0923  HGB 9.8* 9.7*  ALBUMIN  3.4* 3.1*  CALCIUM  8.4* 7.9*  PHOS  --  4.4  CREATININE 5.79* 8.39*  K 4.4 3.9   No results for input(s): IRON, TIBC, FERRITIN in the last 168 hours. Inpatient medications:  apixaban   2.5 mg Oral Q12H   Chlorhexidine  Gluconate Cloth  6 each Topical Q0600   cyanocobalamin   1,000 mcg Oral Daily   docusate sodium   100 mg Oral BID   gabapentin   100 mg Oral QHS   multivitamin with minerals  1 tablet Oral Daily   pantoprazole   40 mg Oral Daily   sevelamer  carbonate  1,600 mg Oral TID WC    acetaminophen , albuterol , alum & mag hydroxide-simeth, bisacodyl , diphenhydrAMINE , HYDROmorphone  (DILAUDID ) injection, magnesium  citrate, menthol -cetylpyridinium **OR** phenol, methocarbamol  **OR** methocarbamol  (ROBAXIN ) injection, metoCLOPramide  **OR** metoCLOPramide  (REGLAN ) injection, ondansetron   **OR** ondansetron  (ZOFRAN ) IV, oxyCODONE , oxyCODONE , pentafluoroprop-tetrafluoroeth, polyethylene glycol

## 2024-08-25 NOTE — Progress Notes (Signed)
 Physical Therapy Treatment Patient Details Name: Colleen Gray MRN: [id number] DOB: 1951/09/19 Today's Date: 08/25/2024   History of Present Illness Colleen Gray is a 73 y.o. female who presented 08/23/24 for elective R TKA. PMHx: HTN, T2DM, ESRF (HD T/Th/Sat), OA, and asthma.    PT Comments  Pt limited by RLE pain this session. She demonstrated limited AROM unable to complete a straight leg raise or perform heel slides without assistance. Pt reports continued use of CPM. Reviewed TKA HEP and encouraged pt to complete 10 reps of each exercise 2-3 times a day. Pt required increased physical assist and increased time to complete functional mobility secondary to pain. She ambulated a maximum of 6 feet during step pivot transfers. Treatment discontinued upon transports arrival to take pt to HD.      If plan is discharge home, recommend the following: A lot of help with walking and/or transfers;A lot of help with bathing/dressing/bathroom;Assistance with cooking/housework;Assist for transportation;Help with stairs or ramp for entrance   Can travel by private vehicle        Equipment Recommendations       Recommendations for Other Services       Precautions / Restrictions Precautions Precautions: Knee;Fall Precaution Booklet Issued: Yes (comment) Recall of Precautions/Restrictions: Impaired Precaution/Restrictions Comments: Pt greeted with knees of bed elevated. Reviewed positioning considerations s/p TKA. Emphasized that pt shouldn't have anything resting underneath her right knee or the bed bent to allow her knee to rest in flex. Encouraged use of zero degree bone foam, which pt reported she couldn't tolerate. Encouraged use of CPM. Required Braces or Orthoses: Knee Immobilizer - Right Knee Immobilizer - Right: On at all times;On except when in CPM;Other (comment) (Until nerve block wear off) Restrictions Weight Bearing Restrictions Per Provider Order: Yes RLE Weight Bearing Per  Provider Order: Weight bearing as tolerated     Mobility  Bed Mobility Overal bed mobility: Needs Assistance Bed Mobility: Supine to Sit, Sit to Supine     Supine to sit: Min assist, HOB elevated, Used rails Sit to supine: Mod assist, HOB elevated   General bed mobility comments: Pt sat up on R side of bed with increased time. Assist to manage RLE. Cues for sequencing. Pt elevated trunk by pulling on bed rails. Assist to scoot EOB with use of bed pad to reach foot flat. Returning to bed pt sat down on the L side. Assist to bring BLE back into bed. Cued pt on sequencing. Repositioned using bed features and +2 assist.    Transfers Overall transfer level: Needs assistance Equipment used: Rolling walker (2 wheels) Transfers: Sit to/from Stand, Bed to chair/wheelchair/BSC Sit to Stand: Min assist, Mod assist   Step pivot transfers: Min assist       General transfer comment: Pt stood from raised bed height. Cues for proper hand placement using RW, foot positioning to ensure foot flat contact and maximum tolerable flex bilat, increase fwd lean, and use of momentum. Powered up with min-modA. Increased time to achieve upright. Pt slowly bringing hand from bed onto RW. Transferred to recliner chair on left. Fair eccentric control, pt slowly lowering down and then plopping at the final point. Transport arrived to take pt to HD and needed pt to return to bed. Transferred from chair>bed on right. Pt required modA to power up from a lower surface. Increased time to achieve upright posture. Slowly pivot backed to bed. Faciliated hips return to bed.    Ambulation/Gait Ambulation/Gait assistance: Editor, commissioning (  Feet): 6 Feet (1x6, 1x3) Assistive device: Rolling walker (2 wheels) Gait Pattern/deviations: Step-to pattern, Decreased step length - right, Decreased step length - left, Decreased dorsiflexion - right, Decreased dorsiflexion - left, Trunk flexed Gait velocity: decreased      General Gait Details: Pt took short, small laborious steps with minimal foot clearence during transfers. Moderate to max cues for improved sequencing. Pt was limited by pain and fatigue. She intermittently rested L forearm on RW handle. Instructed pt to maintain hands on grips and upright posture. Assist to manuever RW in order to have pt proper positioned and supported. Cued pt to increase WBing through BUE support on RW to offload LEs. Pt slowly pivoted on feet maintaining RLE in ext.   Stairs             Wheelchair Mobility     Tilt Bed    Modified Rankin (Stroke Patients Only)       Balance Overall balance assessment: Needs assistance Sitting-balance support: Bilateral upper extremity supported, Feet supported Sitting balance-Leahy Scale: Fair     Standing balance support: Bilateral upper extremity supported, During functional activity, Reliant on assistive device for balance Standing balance-Leahy Scale: Poor Standing balance comment: Pt dependent on RW and min-modA of therapist.                            Communication Communication Communication: No apparent difficulties  Cognition Arousal: Alert Behavior During Therapy: WFL for tasks assessed/performed   PT - Cognitive impairments: Awareness, Initiation, Sequencing                       PT - Cognition Comments: Pt consistently repeating Oh God, please help me! Pt appears to have decreased insight into current condition. She reported she just doesn't understand why her right knee ROM is limited. Educated pt on the typical impairments following TKA and the general overview of recovery including therapy's impact on improving ROM, strength, gait, balance, and functional mobility. Pt slow to complete transfers, requiring moderate cues for sequencing. Following commands: Impaired Following commands impaired: Follows one step commands with increased time, Follows multi-step commands with increased  time    Cueing Cueing Techniques: Verbal cues, Gestural cues, Tactile cues, Visual cues  Exercises Total Joint Exercises Ankle Circles/Pumps: Supine, Both, AROM, 10 reps Heel Slides: Supine, Right, PROM, 5 reps (Pt reported inability to bend her knee despite assist under heel. Completed PROM with minimal motion d/t pt actively guarding and limited by pain) Straight Leg Raises:  (Pt reported inability to lift RLE on her own today. She attempted 3 times unsuccessfuly.)    General Comments General comments (skin integrity, edema, etc.): Pt reports using CPM once already today. Pt with RLE edema. Transport arrived to take  pt to HD during session.      Pertinent Vitals/Pain Pain Assessment Pain Assessment: 0-10 Pain Score: 9  Pain Location: R Knee, with any movement Pain Descriptors / Indicators: Operative site guarding, Grimacing, Discomfort, Moaning, Crying Pain Intervention(s): Monitored during session, Patient requesting pain meds-RN notified, Utilized relaxation techniques, Limited activity within patient's tolerance, Repositioned    Home Living                          Prior Function            PT Goals (current goals can now be found in the care plan section) Acute Rehab PT  Goals Patient Stated Goal: Have less pain. Be able to use my right knee. Progress towards PT goals: Not progressing toward goals - comment (pt limited by pain)    Frequency    7X/week      PT Plan      Co-evaluation              AM-PAC PT 6 Clicks Mobility   Outcome Measure  Help needed turning from your back to your side while in a flat bed without using bedrails?: A Lot Help needed moving from lying on your back to sitting on the side of a flat bed without using bedrails?: A Lot Help needed moving to and from a bed to a chair (including a wheelchair)?: A Little Help needed standing up from a chair using your arms (e.g., wheelchair or bedside chair)?: A Lot Help needed to  walk in hospital room?: A Lot Help needed climbing 3-5 steps with a railing? : A Lot 6 Click Score: 13    End of Session Equipment Utilized During Treatment: Gait belt Activity Tolerance: Patient limited by pain Patient left: in bed;Other (comment) (with transport present taking pt off the floor to HD) Nurse Communication: Mobility status;Patient requests pain meds PT Visit Diagnosis: Difficulty in walking, not elsewhere classified (R26.2);Other abnormalities of gait and mobility (R26.89);Unsteadiness on feet (R26.81)     Time: 9184-9160 PT Time Calculation (min) (ACUTE ONLY): 24 min  Charges:    $Therapeutic Activity: 23-37 mins PT General Charges $$ ACUTE PT VISIT: 1 Visit                     Randall SAUNDERS, PT, DPT Acute Rehabilitation Services Office: [phone] Secure Chat Preferred  Delon CHRISTELLA Callander 08/25/2024, 9:09 AM

## 2024-08-25 NOTE — Progress Notes (Signed)
 Physical Therapy Treatment Patient Details Name: Colleen Gray MRN: [id number] DOB: [birth year] Today's Date: 08/25/2024   History of Present Illness Colleen Gray is a 73 y.o. female who presented 08/23/24 for elective R TKA. PMHx: HTN, T2DM, ESRF (HD T/Th/Sat), OA, and asthma.    PT Comments  Pt has had a significant decline in ROM, strength, balance, and gait. She required increased physical assist with functional mobility. Pt demonstrated a left lateral lean in sitting likely to avoid RLE pain. She took 18 minutes to ambulate a short distance within the room using RW with minA and a close chair follow. Pt continues to be limited by pain despite premedication. She demonstrated decreased activity tolerance and instability during gait. Will continue to follow acutely and advance appropriately.      If plan is discharge home, recommend the following: A lot of help with walking and/or transfers;A lot of help with bathing/dressing/bathroom;Assistance with cooking/housework;Assist for transportation;Help with stairs or ramp for entrance   Can travel by private vehicle        Equipment Recommendations  None recommended by PT    Recommendations for Other Services       Precautions / Restrictions Precautions Precautions: Knee;Fall Precaution Booklet Issued: Yes (comment) Recall of Precautions/Restrictions: Impaired Required Braces or Orthoses: Knee Immobilizer - Right Knee Immobilizer - Right: On at all times;On except when in CPM;Other (comment) (Until nerve block wear off) Restrictions Weight Bearing Restrictions Per Provider Order: Yes RLE Weight Bearing Per Provider Order: Weight bearing as tolerated     Mobility  Bed Mobility Overal bed mobility: Needs Assistance Bed Mobility: Supine to Sit, Sit to Supine     Supine to sit: Min assist, Mod assist, HOB elevated, Used rails Sit to supine: Max assist, +2 for physical assistance, +2 for safety/equipment   General bed  mobility comments: Assist to manage RLE. Cues for sequencing and use of bed rail to elevate trunk. Assist via bed pad to scoot pt to EOB. Returning to bed pt was significantly limited by pain and fatigue, unable to assist much with getting back to bed. PT managed BLEs while Mobility Specialists controlled trunk. Repositioned to pt's comfort and applied ice on her knee.    Transfers Overall transfer level: Needs assistance Equipment used: Rolling walker (2 wheels) Transfers: Sit to/from Stand Sit to Stand: From elevated surface, Mod assist, +2 physical assistance           General transfer comment: Pt with limited right knee flex, keeping foot infront of her d/t pain. She was able to bring L foot underneath her. Cued proper hand placement using RW. Powered up from a raised surfaced with modA x2. Increased time for pt to achieve upright posture with hips tucked. Good eccentric control with sitting.    Ambulation/Gait Ambulation/Gait assistance: Min assist, +2 safety/equipment (Chair Follow) Gait Distance (Feet): 10 Feet Assistive device: Rolling walker (2 wheels) Gait Pattern/deviations: Step-to pattern, Decreased step length - right, Decreased step length - left, Decreased stride length, Decreased dorsiflexion - right, Decreased dorsiflexion - left, Trunk flexed, Wide base of support, Drifts right/left Gait velocity: decreased     General Gait Details: Pt ambulated with short, slow, effortful steps. She demonstrated a fwd lean. Cues for proximity to RW. Educated pt to increase WBing through BUE support to offload LEs. Pt took intermittent standing rest breaks. As she fatigued, pt intermittently rested L forearm on RW. Instructed pt to maintain upright posture and encouraged seated rest breaks in recliner chair, which he  refused. Pt with minimal foot clearence. She kept RLE in front and maintained ext while advancing. Pt held LLE behind her and didn't reach her opposite foot when advancing and  demonstrated significant fwd lean when taking a step. Pt unsteady. Assist to manuever RW within room. She took an extended amount of time to ambulate a short distance within the room.   Stairs             Wheelchair Mobility     Tilt Bed    Modified Rankin (Stroke Patients Only)       Balance Overall balance assessment: Needs assistance Sitting-balance support: Bilateral upper extremity supported, Feet supported Sitting balance-Leahy Scale: Fair Sitting balance - Comments: Pt sat EOB with CGA-minA for safety and stability d/t significant left lateral lean likely d/t pt attempting to avoid/limit pain in right knee. Postural control: Left lateral lean Standing balance support: Bilateral upper extremity supported, During functional activity, Reliant on assistive device for balance Standing balance-Leahy Scale: Poor Standing balance comment: Pt dependent on RW and external support of therapist.                            Communication Communication Communication: No apparent difficulties  Cognition Arousal: Alert Behavior During Therapy: WFL for tasks assessed/performed   PT - Cognitive impairments: Awareness, Initiation, Sequencing, Problem solving, Safety/Judgement                         Following commands: Impaired Following commands impaired: Follows one step commands with increased time, Follows multi-step commands with increased time    Cueing Cueing Techniques: Verbal cues, Gestural cues, Tactile cues, Visual cues  Exercises Total Joint Exercises Long Arc Quad: Seated, Right, AAROM, 15 reps Marching in Standing: Seated, Right, AAROM, 15 reps    General Comments General comments (skin integrity, edema, etc.): VSS on RA. Pt reported a 10/10 on the modified RPE scale at end of session.      Pertinent Vitals/Pain Pain Assessment Pain Assessment: 0-10 Pain Score: 10-Worst pain ever Pain Location: R Knee, with any movement Pain Descriptors  / Indicators: Operative site guarding, Grimacing, Discomfort, Moaning, Burning Pain Intervention(s): Premedicated before session, Monitored during session, Limited activity within patient's tolerance, Repositioned, Utilized relaxation techniques, Ice applied    Home Living                          Prior Function            PT Goals (current goals can now be found in the care plan section) Acute Rehab PT Goals Patient Stated Goal: Knee pain to be controlled Progress towards PT goals: Not progressing toward goals - comment (pt limited by pain)    Frequency    7X/week      PT Plan      Co-evaluation              AM-PAC PT 6 Clicks Mobility   Outcome Measure  Help needed turning from your back to your side while in a flat bed without using bedrails?: A Lot Help needed moving from lying on your back to sitting on the side of a flat bed without using bedrails?: Total Help needed moving to and from a bed to a chair (including a wheelchair)?: Total Help needed standing up from a chair using your arms (e.g., wheelchair or bedside chair)?: Total Help needed to walk in  hospital room?: Total Help needed climbing 3-5 steps with a railing? : Total 6 Click Score: 7    End of Session Equipment Utilized During Treatment: Gait belt Activity Tolerance: Patient limited by pain;Patient limited by fatigue Patient left: in bed;with call bell/phone within reach;with bed alarm set Nurse Communication: Mobility status;Other (comment);Need for lift equipment (pt's report of no difference from pain medicaiton; potential to use stedy) PT Visit Diagnosis: Difficulty in walking, not elsewhere classified (R26.2);Other abnormalities of gait and mobility (R26.89);Unsteadiness on feet (R26.81)     Time: 1510-1550 PT Time Calculation (min) (ACUTE ONLY): 40 min  Charges:    $Gait Training: 8-22 mins $Therapeutic Exercise: 8-22 mins $Therapeutic Activity: 8-22 mins PT General  Charges $$ ACUTE PT VISIT: 1 Visit                     Randall SAUNDERS, PT, DPT Acute Rehabilitation Services Office: [phone] Secure Chat Preferred  Colleen Gray 08/25/2024, 4:20 PM

## 2024-08-25 NOTE — Telephone Encounter (Signed)
 Called patient to reschedule to later date or move to [facility] schedule.

## 2024-08-25 NOTE — Progress Notes (Signed)
 Subjective: Patient reports pain as worse today than yesterday. Review of chart shows she hasn't had much Robaxin ,  Tylenol , or Oxycodone  since surgery. Tolerating diet.  Urinating.   No CP, SOB.  Had a hard time with knee ROM and mobilizing OOB with PT this morning. Block has likely worn off.   Had dialysis today.  Objective:   VITALS:   Vitals:   08/25/24 1034 08/25/24 1104 08/25/24 1130 08/25/24 1200  BP: 124/63 (!) 142/65 127/64 (!) 147/61  Pulse: 77 77 81 77  Resp: 17 14 14 13   Temp:    98 F (36.7 C)  TempSrc:    Oral  SpO2: 97% 97% 100% 96%  Weight:    91.3 kg  Height:          Latest Ref Rng & Units 08/25/2024    9:23 AM 08/23/2024    9:51 PM 08/23/2024   11:39 AM  CBC  WBC 4.0 - 10.5 K/uL 8.3  10.5    Hemoglobin 12.0 - 15.0 g/dL 9.7  9.8  89.0   Hematocrit 36.0 - 46.0 % 30.0  30.5  32.0   Platelets 150 - 400 K/uL 192  179        Latest Ref Rng & Units 08/25/2024    9:23 AM 08/23/2024    9:51 PM 08/23/2024   11:39 AM  BMP  Glucose 70 - 99 mg/dL 837  749  891   BUN 8 - 23 mg/dL 53  36  35   Creatinine 0.44 - 1.00 mg/dL 1.60  4.20  4.69   Sodium 135 - 145 mmol/L 138  139  138   Potassium 3.5 - 5.1 mmol/L 3.9  4.4  4.1   Chloride 98 - 111 mmol/L 97  99  99   CO2 22 - 32 mmol/L 24  24    Calcium  8.9 - 10.3 mg/dL 7.9  8.4     Intake/Output      09/03 0701 09/04 0700 09/04 0701 09/05 0700   I.V. (mL/kg)     IV Piggyback     Total Intake(mL/kg)     Other  0.1   Blood     Total Output  0.1   Net  -0.1           Physical Exam: General: NAD. Laying flat in bed, calm, comfortable Resp: No increased wob Cardio: regular rate and rhythm ABD soft Neurologically intact MSK Neurovascularly intact Sensation intact distally Intact pulses distally Dorsiflexion/Plantar flexion intact Incision: dressing C/D/I Very limited hip or knee ROM   Assessment: 2 Days Post-Op  S/P Procedure(s) (LRB): ARTHROPLASTY, KNEE, TOTAL, RIGHT (Right) by Dr. Evalene BIRCH. Beverley  on 08/23/24  Principal Problem:   S/P total knee arthroplasty, right   Plan: Dialysis managed by nephrology  Advance diet Up with therapy, encouraged her to be doing exercises on her own as well when in bed or the chair Incentive Spirometry Elevate and Apply ice  Weightbearing: WBAT RLE Insicional and dressing care: Dressings left intact until follow-up and Reinforce dressings as needed Orthopedic device(s): CPM Showering: Keep dressing dry VTE prophylaxis: Eliquis  2.5mg  bid x 30 days , SCDs, ambulation Pain control: PRN, encouraged her to increase her frequency of the Robaxin  in particular as pain is likely limiting her progress Follow - up plan: 2 weeks Contact information:  Evalene Beverley MD, Gerard Large PA-C  Dispo: Home with HHPT via [facility] when passes PT evaluation and medically stable.   Apparently patient no longer  has stable social support and someone to stay in the home with her for the first week post-op which is CONTRADICTORY to what she told us  in office. The only reason we agreed to move forward with surgery was because she told us  her aunt was going to stay with her. She said there are people she can call to check in on her but no one to stay with her.   Another concern is that she uses the public bus for transportation anywhere and I'm concerned how she will be able to mobilize on and off the bus to get to her weekly dialysis appointments.   This now poses a severe issue and will need help from [facility] on managing safe discharge.     Gerard CHRISTELLA Large, PA-C Office [phone] 08/25/2024, 2:29 PM

## 2024-08-26 MED ORDER — CHLORHEXIDINE GLUCONATE CLOTH 2 % EX PADS: 6.0000 | MEDICATED_PAD | Freq: Every day | CUTANEOUS | Status: DC

## 2024-08-26 NOTE — Anesthesia Postprocedure Evaluation (Signed)
 Anesthesia Post Note  Patient: Colleen Gray  Procedure(s) Performed: ARTHROPLASTY, KNEE, TOTAL, RIGHT (Right: Knee)     Patient location during evaluation: PACU Anesthesia Type: Spinal, MAC and Regional Level of consciousness: awake and alert Pain management: pain level controlled Vital Signs Assessment: post-procedure vital signs reviewed and stable Respiratory status: spontaneous breathing, nonlabored ventilation and respiratory function stable Cardiovascular status: stable and blood pressure returned to baseline Postop Assessment: no apparent nausea or vomiting Anesthetic complications: no   No notable events documented.                Shlomo Seres

## 2024-08-26 NOTE — Care Management Important Message (Signed)
 Important Message  Patient Details  Name: Colleen Gray MRN: [id number] Date of Birth: May 14, 1951   Important Message Given:  Yes - Medicare IM     Jon Cruel 08/26/2024, 3:56 PM

## 2024-08-26 NOTE — TOC Progression Note (Signed)
 Transition of Care [facility]) - Progression Note    Patient Details  Name: Colleen Gray MRN: [id number] Date of Birth: 31-Mar-1951  [facility] CM/SW Contact  Nola Devere Hands, RN Phone Number: 08/26/2024, 2:49 PM  Clinical Narrative:     73 yr old female s/p right total knee arthroplasty. Case manager spoke with patient regarding discharge plan. Patient had informed MD office prior to scheduling that she had care at discharge. Today patient states she does not have 24/7 care, has people that can pop in but nothing consistent. Patient says she would like to go to [facility] if possible. CM has updated Child psychotherapist.                      Expected Discharge Plan and Services                                               Social Drivers of Health (SDOH) Interventions SDOH Screenings   Food Insecurity: Patient Declined (08/24/2024)  Housing: Patient Declined (08/24/2024)  Transportation Needs: Patient Declined (08/24/2024)  Utilities: Patient Declined (08/24/2024)  Financial Resource Strain: Low Risk  (01/22/2022)   Received from [facility]  Physical Activity: Unknown (01/22/2022)   Received from [facility], [facility] visits prior to 02/21/2023.  Social Connections: Unknown (08/24/2024)  Stress: No Stress Concern Present (01/22/2022)   Received from [facility]  Tobacco Use: Low Risk  (08/23/2024)    Readmission Risk Interventions     No data to display

## 2024-08-26 NOTE — TOC Progression Note (Addendum)
 Transition of Care [facility]) - Progression Note    Patient Details  Name: Colleen Gray MRN: [id number] Date of Birth: 1951-11-21  Transition of Care [facility]) CM/SW Contact  Bridget Cordella Simmonds, LCSW Phone Number: 08/26/2024, 3:57 PM  Clinical Narrative:   Referral sent out in hub for SNF, CSW reached out to Rhonda/Blumenthal to review.   1610: [facility] does offer.  Per Rhonda/Blumenthal they can do the current HD time TTS 1025am.  Able to receive pt tomorrow.     CSW spoke with pt and she does want to accept this offer.    MD/PA updated.    Tracy/Renal updated.   [facility] does not have CPM machine, but per Rhonda/Blumenthal, they had pt before, she lives close by so they could likely get the CPM machine from her home.                 Expected Discharge Plan and Services                                               Social Drivers of Health (SDOH) Interventions SDOH Screenings   Food Insecurity: Patient Declined (08/24/2024)  Housing: Patient Declined (08/24/2024)  Transportation Needs: Patient Declined (08/24/2024)  Utilities: Patient Declined (08/24/2024)  Financial Resource Strain: Low Risk  (01/22/2022)   Received from [facility]  Physical Activity: Unknown (01/22/2022)   Received from [facility], [facility] visits prior to 02/21/2023.  Social Connections: Unknown (08/24/2024)  Stress: No Stress Concern Present (01/22/2022)   Received from [facility]  Tobacco Use: Low Risk  (08/23/2024)    Readmission Risk Interventions     No data to display

## 2024-08-26 NOTE — Evaluation (Signed)
 Occupational Therapy Evaluation Patient Details Name: Colleen Gray MRN: [id number] DOB: [birth year] Today's Date: 08/26/2024   History of Present Illness   Colleen Gray is a 73 y.o. female who presented 08/23/24 for elective R TKA. PMHx: HTN, T2DM, ESRF (HD T/Th/Sat), OA, and asthma.     Clinical Impressions At baseline, pt is largely Mod I for ADLs, IADLs, and functional mobility with a RW. Pt now presents with pain affecting functional level, decreased activity tolerance, decreased balance, decreased cognition, and decreased safety and independence with functional tasks. Pt currently demonstrates ability to complete ADLs largely Ind to Max assist, bed mobility with Contact guard to Min assist, and functional transfers/mobility with a RW with Min to Mod assist with increased time. Pt participated well in session and is motivated to return to PLOF. Pt will benefit from acute OT services to address deficits and increase safety and independence with functional tasks. OT recommends pt follow physician's recommendations for discharge plan and follow up therapies.      If plan is discharge home, recommend the following:   A lot of help with walking and/or transfers;A lot of help with bathing/dressing/bathroom;Assistance with cooking/housework;Assist for transportation;Help with stairs or ramp for entrance     Functional Status Assessment   Patient has had a recent decline in their functional status and demonstrates the ability to make significant improvements in function in a reasonable and predictable amount of time.     Equipment Recommendations   Other (comment) (TBD based on pt progress)     Recommendations for Other Services         Precautions/Restrictions   Precautions Precautions: Knee;Fall Precaution Booklet Issued: Yes (comment) (provided in prior PT session) Recall of Precautions/Restrictions: Impaired Required Braces or Orthoses: Knee Immobilizer -  Right Knee Immobilizer - Right: On at all times;On except when in CPM;Other (comment) (Until nerve block wears off) Restrictions Weight Bearing Restrictions Per Provider Order: Yes RLE Weight Bearing Per Provider Order: Weight bearing as tolerated     Mobility Bed Mobility Overal bed mobility: Needs Assistance Bed Mobility: Rolling, Supine to Sit Rolling: Contact guard assist, Used rails   Supine to sit: Min assist, HOB elevated, Used rails     General bed mobility comments: Assist to manage R LE; cues for hand placement/technique    Transfers Overall transfer level: Needs assistance Equipment used: Rolling walker (2 wheels) Transfers: Sit to/from Stand, Bed to chair/wheelchair/BSC Sit to Stand: Mod assist     Step pivot transfers: Min assist     General transfer comment: Significantly increased time for pt to achieve upright posture with hips tucked.      Balance Overall balance assessment: Needs assistance Sitting-balance support: Single extremity supported, No upper extremity supported, Feet supported Sitting balance-Leahy Scale: Good Sitting balance - Comments: Sitting EOB Postural control: Left lateral lean (occasional Left lateral lean to offload R LE due to pain but with pt able to self correct with verbal cues and with no L lateral lean with R LE elevated while sitting EOB with L LE on floor) Standing balance support: Bilateral upper extremity supported, During functional activity, Reliant on assistive device for balance Standing balance-Leahy Scale: Poor                             ADL either performed or assessed with clinical judgement   ADL Overall ADL's : Needs assistance/impaired Eating/Feeding: Independent;Sitting   Grooming: Set up;Sitting   Upper Body Bathing:  Set up;Supervision/ safety;Sitting   Lower Body Bathing: Moderate assistance;Maximal assistance;Sit to/from stand;Cueing for compensatory techniques   Upper Body Dressing : Set  up;Supervision/safety;Sitting   Lower Body Dressing: Moderate assistance;Maximal assistance;Sit to/from stand;Cueing for compensatory techniques   Toilet Transfer: Minimal assistance;Moderate assistance;Ambulation;BSC/3in1;Rolling walker (2 wheels) Toilet Transfer Details (indicate cue type and reason): Mod assist to power up with significantly increased time; Min assist once in standing Toileting- Clothing Manipulation and Hygiene: Maximal assistance;Sit to/from stand;Cueing for compensatory techniques       Functional mobility during ADLs: Minimal assistance;Rolling walker (2 wheels) General ADL Comments: Pt with decreased activity tolerance, fatiguing quickly during tasks     Vision Baseline Vision/History: 1 Wears glasses (progressives) Ability to See in Adequate Light: 0 Adequate (with glasses) Patient Visual Report: No change from baseline Vision Assessment?: No apparent visual deficits (with glasses on) Additional Comments: Vision 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)   Lower Extremity Assessment Lower Extremity Assessment: Defer to PT evaluation   Cervical / Trunk Assessment Cervical / Trunk Assessment: Other exceptions Cervical / Trunk Exceptions: Thoracic Rounding   Communication Communication Communication: No apparent difficulties   Cognition Arousal: Alert Behavior During Therapy: WFL for tasks  assessed/performed Cognition: Cognition impaired     Awareness: Intellectual awareness intact, Online awareness intact (intermittent online awareness) Memory impairment (select all impairments): Short-term memory Attention impairment (select first level of impairment): Alternating attention Executive functioning impairment (select all impairments): Problem solving, Reasoning (Decreased insight into how current deficits will impact functional level and safety with completing ADLs and IADLs upon returing home) OT - Cognition Comments: AAOx4 and pleasant throughout session. Cognition largely [facility] for tasks assessed but with noted deficits above                 Following commands: Intact, Impaired (Largely intact with mildly increased time for following multi-step instructions) Following commands impaired: Follows multi-step commands with increased time     Cueing  General Comments   Cueing Techniques: Verbal cues;Visual cues;Gestural cues  VSS on RA. PT dovetailing at end of session.   Exercises     Shoulder Instructions      Home Living Family/patient expects to be discharged to:: Private residence Living Arrangements: Alone Available Help at Discharge: Family;Friend(s);Available PRN/intermittently Type of Home: House (townhouse) Home Access: Stairs to enter Secretary/administrator of Steps: 3 Entrance Stairs-Rails: Right Home Layout: One level     Bathroom Shower/Tub: Chief Strategy Officer: Standard (keeps BSC over top to increase height) Bathroom Accessibility: No (Pt reports she uses her cane in the bathroom because it is very tight with her walker)   Home Equipment: Tub bench;Cane - quad;Cane - single point;Rollator (4 wheels);BSC/3in1;Rolling Environmental consultant (2 wheels);Wheelchair - manual;Wheelchair - power;Hand held shower head;Lift chair   Additional Comments: Pt reports the bed she sleeps in is ~21 high & the bed the CPM is placed on is a standard height but  twin mattress      Prior Functioning/Environment  Prior Level of Function : Independent/Modified Independent;Driving             Mobility Comments: Ambulates using RW with seat attachment and/or quad cane. Pt reports she has to hold onto something (push/pull) in order to stand. Denies falls in the past 84mo. ADLs Comments: ModI for ADLs/IADLs. Uses the tub bench to get in/out of the shower. Sits to shower. 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 Relies on bus transportation to/from dialysis. Pt enjoys making crafts, watching YouTube videos about crafts and other subjects, playing computer games, and socializing with friends and family.    OT Problem List: Decreased activity tolerance;Impaired balance (sitting and/or standing);Decreased knowledge of use of DME or AE   OT Treatment/Interventions: Self-care/ADL training;Therapeutic exercise;Energy conservation;DME and/or AE instruction;Therapeutic activities;Cognitive remediation/compensation;Patient/family education;Balance training      OT Goals(Current goals can be found in the care plan section)   Acute Rehab OT Goals Patient Stated Goal: to return home and to heal well OT Goal Formulation: With patient Time For Goal Achievement: 09/09/24 Potential to Achieve Goals: Good ADL Goals Pt Will Perform Grooming: with supervision;standing Pt Will Perform Lower Body Bathing: with contact guard assist;with adaptive equipment;sit to/from stand Pt Will Perform Lower Body Dressing: with contact guard assist;with adaptive equipment;sit to/from stand Pt Will Transfer to Toilet: with supervision;ambulating;bedside commode (with least restrictive AD) Pt Will Perform Toileting - Clothing Manipulation and hygiene: with supervision;sitting/lateral leans;sit to/from stand (with adaptive equipment as needed) Pt/caregiver will Perform Home Exercise Program: Both right and left upper extremity;With theraband;Independently;With  written HEP provided (Increased activity tolerance) Additional ADL Goal #1: Patient will demonstrate ability to Independently state 3 energy conservation strategies to increase safety and independence with functional tasks with handout provided.   OT Frequency:  Min 2X/week    Co-evaluation              AM-PAC OT 6 Clicks Daily Activity     Outcome Measure Help from another person eating meals?: None Help from another person taking care of personal grooming?: A Little Help from another person toileting, which includes using toliet, bedpan, or urinal?: A Lot Help from another person bathing (including washing, rinsing, drying)?: A Lot Help from another person to put on and taking off regular upper body clothing?: A Little Help from another person to put on and taking off regular lower body clothing?: A Lot 6 Click Score: 16   End of Session Equipment Utilized During Treatment: Gait belt;Rolling walker (2 wheels) CPM Right Knee CPM Right Knee: Off Nurse Communication: Mobility status;Other (comment) (Pt premedicated prior to session)  Activity Tolerance: Patient tolerated treatment well Patient left: Other (comment) (Ambulating with PT)  OT Visit Diagnosis: Unsteadiness on feet (R26.81);Other abnormalities of gait and mobility (R26.89)                Time: 9049-8974 OT Time Calculation (min): 35 min Charges:  OT General Charges $OT Visit: 1 Visit OT Evaluation $OT Eval Moderate Complexity: 1 Mod OT Treatments $Self Care/Home Management : 8-22 mins  Margarie Rockey HERO., OTR/L, MA Acute Rehab [phone]   Margarie FORBES Horns 08/26/2024, 12:25 PM

## 2024-08-26 NOTE — Progress Notes (Signed)
 Physical Therapy Treatment Patient Details Name: Colleen Gray MRN: [id number] DOB: 28-Sep-1951 Today's Date: 08/26/2024   History of Present Illness Colleen Gray is a 73 y.o. female who presented 08/23/24 for elective R TKA. PMHx: HTN, T2DM, ESRF (HD T/Th/Sat), OA, and asthma.    PT Comments  Pt requires increased time to complete all functional mobility. She demonstrated poor activity tolerance, quickly fatiguing during mobility. Pt ambulated ~36ft using RW with minA. She displayed multiple gait abnormalities and intermittently paused to rest left forearm on RW. Cued PLB techniques. Pt reported a 10/10 on the modified RPE scale following ambulation. Deferred R TKA exercises. Assisted pt into CPM. Encouraged frequent mobilization with nursing staff, OOB<>chair 3x/day, and ambulating to bathroom instead of using BSC.     If plan is discharge home, recommend the following: A lot of help with walking and/or transfers;A lot of help with bathing/dressing/bathroom;Assistance with cooking/housework;Assist for transportation;Help with stairs or ramp for entrance   Can travel by private vehicle        Equipment Recommendations  None recommended by PT    Recommendations for Other Services       Precautions / Restrictions Precautions Precautions: Knee;Fall Precaution Booklet Issued: Yes (comment) Recall of Precautions/Restrictions: Impaired Required Braces or Orthoses: Knee Immobilizer - Right Knee Immobilizer - Right: On at all times;On except when in CPM;Other (comment) (Until nerve block wears off) Restrictions Weight Bearing Restrictions Per Provider Order: Yes RLE Weight Bearing Per Provider Order: Weight bearing as tolerated     Mobility  Bed Mobility               General bed mobility comments: Not assessed. Pt greeted seated EOB with OT.    Transfers Overall transfer level: Needs assistance Equipment used: Rolling walker (2 wheels) Transfers: Sit to/from Stand,  Bed to chair/wheelchair/BSC Sit to Stand: Mod assist   Step pivot transfers: Min assist       General transfer comment: Significantly increased time for pt to achieve upright posture with hips tucked. Pt demonstrated proper hand placement using RW. Cued increased knee flex bilat, fwd lean, and use of momentum. Fair eccentric control.    Ambulation/Gait Ambulation/Gait assistance: Contact guard assist, Min assist Gait Distance (Feet): 30 Feet Assistive device: Rolling walker (2 wheels) Gait Pattern/deviations: Step-to pattern, Decreased step length - right, Decreased step length - left, Decreased stride length, Decreased dorsiflexion - right, Decreased dorsiflexion - left, Wide base of support, Trunk flexed Gait velocity: decreased Gait velocity interpretation: <1.31 ft/sec, indicative of household ambulator   General Gait Details: Pt took short steps with minimal foot clearence. She achieved heel strike on RLE, quickly progressing to foot flat, and no push off. Pt will slightly circumduct to advance RLE. She is lacking hip and knee flex on RLE. Distance limited d/t fatigue. Pt intermittently resting L forearm on RW grips. Instructed pt to maintain upright posture and cued PLB technique. Pt's L hand appeared to slip while manuevering RW. Assist to guide AD and maintain proper proximity. Washcloth wrapped around L hand grip to increase size to aid pt in holding onto RW. No LOB.   Stairs             Wheelchair Mobility     Tilt Bed    Modified Rankin (Stroke Patients Only)       Balance Overall balance assessment: Needs assistance Sitting-balance support: Single extremity supported, No upper extremity supported, Feet supported Sitting balance-Leahy Scale: Good   Postural control: Left lateral lean (occasional  Left lateral lean to offload RLE d/t pain but with pt able to self correct with verbal cues and with no L lateral lean with R LE elevated while sitting EOB with L LE on  floor) Standing balance support: Bilateral upper extremity supported, During functional activity, Reliant on assistive device for balance Standing balance-Leahy Scale: Poor Standing balance comment: Pt dependent on RW and external support of therapist.                            Communication Communication Communication: No apparent difficulties  Cognition Arousal: Alert Behavior During Therapy: WFL for tasks assessed/performed   PT - Cognitive impairments: No apparent impairments                         Following commands: Intact      Cueing Cueing Techniques: Verbal cues, Visual cues, Gestural cues  Exercises      General Comments General comments (skin integrity, edema, etc.): VSS on RA. Dovetailing at end of OT session. Pt reported a 10/10 on the modified RPE scale after ambulation. Deferred total joint exercises.      Pertinent Vitals/Pain Pain Assessment Pain Assessment: 0-10 Pain Score: 5  Pain Location: R knee Pain Descriptors / Indicators: Operative site guarding, Grimacing, Discomfort, Burning, Aching Pain Intervention(s): Premedicated before session, Monitored during session, Limited activity within patient's tolerance, Repositioned, Utilized relaxation techniques    Home Living Family/patient expects to be discharged to:: Private residence Living Arrangements: Alone Available Help at Discharge: Family;Friend(s);Available PRN/intermittently Type of Home: House (townhouse) Home Access: Stairs to enter Entrance Stairs-Rails: Right Entrance Stairs-Number of Steps: 3   Home Layout: One level Home Equipment: Tub bench;Cane - quad;Cane - single point;Rollator (4 wheels);BSC/3in1;Rolling Environmental consultant (2 wheels);Wheelchair - manual;Wheelchair - power;Hand held shower head;Lift chair Additional Comments: Pt reports the bed she sleeps in is ~21 high & the bed the CPM is placed on is a standard height but twin mattress    Prior Function             PT Goals (current goals can now be found in the care plan section) Acute Rehab PT Goals Patient Stated Goal: Get better Progress towards PT goals: Progressing toward goals    Frequency    7X/week      PT Plan      Co-evaluation              AM-PAC PT 6 Clicks Mobility   Outcome Measure  Help needed turning from your back to your side while in a flat bed without using bedrails?: A Little Help needed moving from lying on your back to sitting on the side of a flat bed without using bedrails?: A Little Help needed moving to and from a bed to a chair (including a wheelchair)?: A Little Help needed standing up from a chair using your arms (e.g., wheelchair or bedside chair)?: A Lot Help needed to walk in hospital room?: A Little Help needed climbing 3-5 steps with a railing? : A Lot 6 Click Score: 16    End of Session Equipment Utilized During Treatment: Gait belt Activity Tolerance: Patient tolerated treatment well;Patient limited by fatigue Patient left: in bed;with call bell/phone within reach;with bed alarm set Nurse Communication: Mobility status PT Visit Diagnosis: Difficulty in walking, not elsewhere classified (R26.2);Other abnormalities of gait and mobility (R26.89);Unsteadiness on feet (R26.81)     Time: 8990-8950 PT Time Calculation (min) (ACUTE ONLY):  40 min  Charges:    $Gait Training: 8-22 mins $Therapeutic Activity: 8-22 mins PT General Charges $$ ACUTE PT VISIT: 1 Visit                     Randall SAUNDERS, PT, DPT Acute Rehabilitation Services Office: [phone] Secure Chat Preferred  Delon CHRISTELLA Callander 08/26/2024, 12:40 PM

## 2024-08-26 NOTE — Progress Notes (Signed)
 Physical Therapy Treatment Patient Details Name: Colleen Gray MRN: [id number] DOB: 07-03-1951 Today's Date: 08/26/2024   History of Present Illness Colleen Gray is a 73 y.o. female who presented 08/23/24 for elective R TKA. PMHx: HTN, T2DM, ESRF (HD T/Th/Sat), OA, and asthma.    PT Comments  Today's session focused on stair training. Educated pt to ascend with RLE and descend with LLE. Pt's home steps can accommodate a RW and are 4 and 6. Demonstrated proper and safe technique. She was able to bring LLE onto 4 step with RW and min-modA x2, but unsuccessful in attempts to lift RLE. Pt c/o worsening pain to 9/10. Facilitated seated rest in recliner chair and notified RN, who entered to administer pain medication. Pt continues to be limited by pain and decreased activity tolerance.      If plan is discharge home, recommend the following: Assistance with cooking/housework;Assist for transportation;Help with stairs or ramp for entrance;Two people to help with walking and/or transfers;A lot of help with bathing/dressing/bathroom   Can travel by private vehicle        Equipment Recommendations  None recommended by PT    Recommendations for Other Services       Precautions / Restrictions Precautions Precautions: Knee;Fall Precaution Booklet Issued: Yes (comment) Recall of Precautions/Restrictions: Impaired Required Braces or Orthoses: Knee Immobilizer - Right Knee Immobilizer - Right: On at all times;On except when in CPM;Other (comment) (Until nerve block wears off) Restrictions Weight Bearing Restrictions Per Provider Order: Yes RLE Weight Bearing Per Provider Order: Weight bearing as tolerated     Mobility  Bed Mobility Overal bed mobility: Needs Assistance Bed Mobility: Sit to Supine       Sit to supine: Mod assist, +2 for physical assistance, +2 for safety/equipment   General bed mobility comments: Pt limited by pain and fatigue. Assist to bring BLE back into bed  and control trunk down. Repositioned using bed features and bed pad.    Transfers Overall transfer level: Needs assistance Equipment used: Rolling walker (2 wheels) Transfers: Sit to/from Stand, Bed to chair/wheelchair/BSC Sit to Stand: Mod assist, Max assist, +2 physical assistance, +2 safety/equipment   Step pivot transfers: Min assist, Mod assist, +2 safety/equipment       General transfer comment: Pt stood from recliner chair. Cued pt to scoot fwd to edge of surface, increase bilat knee flex, hand placement, inc fwd lean, and use of momentum. Pt required modA x2 to power up from a lower surface d/t increased knee pain compared to this morning. As she fatigued pt required greater assistance to aid with getting up. Transferred to right returning to bed. Assist to maneuver RW. Cues for sequencing. Fair eccentric control with pt crying out each time she sat d/t increased R knee flex.    Ambulation/Gait Ambulation/Gait assistance: Min assist, +2 safety/equipment (Chair Follow) Gait Distance (Feet): 3 Feet (x2) Assistive device: Rolling walker (2 wheels) Gait Pattern/deviations: Step-to pattern, Decreased step length - right, Decreased step length - left, Decreased stride length, Decreased dorsiflexion - right, Decreased dorsiflexion - left, Wide base of support, Trunk flexed, Shuffle Gait velocity: decreased Gait velocity interpretation: <1.31 ft/sec, indicative of household ambulator   General Gait Details: Pt took small laborious steps with minimal foot clearence. As she fatigued pt lacked foot clearence and would pivot on her flat foot in the direction she needed to go to transfer.   Stairs Stairs: Yes Stairs assistance: Min assist, Mod assist, +2 physical assistance Stair Management: Forwards, Step to pattern,  With walker Number of Stairs: 1 General stair comments: Educated pt to ascend with LLE and descend with RLE. She showed a picture of the steps to enter her house which are  large concrete blocks spaced out and are spacious enough to accomodate a full RW. Pt reports the first three and 4 and the last one is 6. She attempted to participate in stair training. Pt sucessfully placed RW on top of 4 step and was able to bring LLE onto step with min-modA. She was unable to lift RLE from floor and bring it onto the step. Pt quickly fatigued. Sat down in recliner chair positioned directly behind her.   Wheelchair Mobility     Tilt Bed    Modified Rankin (Stroke Patients Only)       Balance Overall balance assessment: Needs assistance Sitting-balance support: Feet supported, Bilateral upper extremity supported Sitting balance-Leahy Scale: Fair Sitting balance - Comments: Pt sat in recliner chair scooting fwd/bkwd with increased time and BUE support on armrests. Supervision for safety. Postural control: Left lateral lean Standing balance support: Bilateral upper extremity supported, During functional activity, Reliant on assistive device for balance Standing balance-Leahy Scale: Poor Standing balance comment: Pt dependent on RW and +2 assist. She demonstrated a left lateral lean in standing as she fatigued likely to avoid RLE pain.                            Communication Communication Communication: No apparent difficulties  Cognition Arousal: Alert Behavior During Therapy: WFL for tasks assessed/performed   PT - Cognitive impairments: No apparent impairments                         Following commands: Intact      Cueing Cueing Techniques: Verbal cues, Tactile cues, Visual cues  Exercises      General Comments General comments (skin integrity, edema, etc.): Pt reported a 9/10 on the modified RPE scale after stairs attempt.      Pertinent Vitals/Pain Pain Assessment Pain Assessment: 0-10 Pain Score: 9  (Initially 7/10 increased with mobility) Pain Location: R knee Pain Descriptors / Indicators: Operative site guarding,  Grimacing, Moaning, Crying, Burning Pain Intervention(s): Monitored during session, Limited activity within patient's tolerance, Patient requesting pain meds-RN notified, RN gave pain meds during session, Repositioned    Home Living                          Prior Function            PT Goals (current goals can now be found in the care plan section) Acute Rehab PT Goals Patient Stated Goal: Have less pain. Safely be able to return home. Progress towards PT goals: Not progressing toward goals - comment (pt limited by pain and fatigue)    Frequency    7X/week      PT Plan      Co-evaluation              AM-PAC PT 6 Clicks Mobility   Outcome Measure  Help needed turning from your back to your side while in a flat bed without using bedrails?: Total Help needed moving from lying on your back to sitting on the side of a flat bed without using bedrails?: Total Help needed moving to and from a bed to a chair (including a wheelchair)?: Total Help needed standing up from a chair  using your arms (e.g., wheelchair or bedside chair)?: Total Help needed to walk in hospital room?: Total Help needed climbing 3-5 steps with a railing? : Total 6 Click Score: 6    End of Session Equipment Utilized During Treatment: Gait belt Activity Tolerance: Patient limited by pain;Patient limited by fatigue Patient left: in bed;with call bell/phone within reach;with nursing/sitter in room Nurse Communication: Mobility status;Patient requests pain meds PT Visit Diagnosis: Difficulty in walking, not elsewhere classified (R26.2);Other abnormalities of gait and mobility (R26.89);Unsteadiness on feet (R26.81)     Time: 8490-8457 PT Time Calculation (min) (ACUTE ONLY): 33 min  Charges:    $Gait Training: 23-37 mins PT General Charges $$ ACUTE PT VISIT: 1 Visit                     Randall SAUNDERS, PT, DPT Acute Rehabilitation Services Office: [phone] Secure Chat  Preferred  Delon CHRISTELLA Callander 08/26/2024, 4:54 PM

## 2024-08-26 NOTE — Progress Notes (Signed)
 Contacted by CSW regarding pt's likely d/c to snf tomorrow. CSW confirms snf can transport pt to [facility] on TTS 10:25 am chair time. HD info added to AVS. Contacted inpt HD unit to request that pt receive HD on 1st shift tomorrow if possible so pt can d/c to snf in a timely manner. Nephrologist and renal NP made aware of this info as well.   Randine Mungo Dialysis Navigator [phone]

## 2024-08-26 NOTE — NC FL2 (Signed)
 [place]  MEDICAID FL2 LEVEL OF CARE FORM     IDENTIFICATION  Patient Name: Colleen Gray Birthdate: 1951/12/18 Sex: female Admission Date (Current Location): 08/23/2024  [facility] and [place] Number:  Producer, television/film/video and Address:  [facility], [address], [place], [place] [address]      Provider Number: [id number]  Attending Physician Name and Address:  Beverley Evalene BIRCH, MD  Relative Name and Phone Number:  Metta [name] Nyhan   [phone]    Current Level of Care: Hospital Recommended Level of Care: Skilled Nursing Facility Prior Approval Number:    Date Approved/Denied:   PASRR Number: [id number]  Discharge Plan: SNF    Current Diagnoses: Patient Active Problem List   Diagnosis Date Noted   S/P total knee arthroplasty, right 08/23/2024   S/p reverse total shoulder arthroplasty 05/14/2023   Status post reverse total arthroplasty of right shoulder 05/13/2023   Pre-op  evaluation 11/06/2022   ESRD (end stage renal disease) (HCC) 09/19/2022   ESRD (end stage renal disease) on dialysis (HCC) 07/09/2022   Polyneuropathy due to type 2 diabetes mellitus (HCC) 11/12/2021   CKD (chronic kidney disease) stage 5, GFR less than 15 ml/min (HCC) 04/23/2021   Abnormal weight gain 04/01/2021   Colon cancer screening 04/01/2021   Family history of malignant neoplasm of gastrointestinal tract 04/01/2021   Iron deficiency anemia 04/01/2021   Stage 4 chronic kidney disease (HCC) 03/01/2020   Pain due to onychomycosis of toenails of both feet 07/05/2019   Diabetes mellitus without complication (HCC) 07/05/2019   Essential hypertension 06/29/2019   Carpal tunnel syndrome 02/06/2016   S/P shoulder replacement 11/22/2015    Orientation RESPIRATION BLADDER Height & Weight     Self, Time, Situation, Place  Normal   Weight: 200 lb 2.8 oz (90.8 kg) Height:  5' 3 (160 cm)  BEHAVIORAL SYMPTOMS/MOOD NEUROLOGICAL BOWEL NUTRITION STATUS        Diet (see  discharge summary)  AMBULATORY STATUS COMMUNICATION OF NEEDS Skin   Limited Assist Verbally Surgical wounds                       Personal Care Assistance Level of Assistance  Bathing, Feeding, Dressing Bathing Assistance: Limited assistance Feeding assistance: Independent Dressing Assistance: Limited assistance     Functional Limitations Info  Sight, Hearing, Speech Sight Info: Adequate Hearing Info: Adequate Speech Info: Adequate    SPECIAL CARE FACTORS FREQUENCY  PT (By licensed PT), OT (By licensed OT)     PT Frequency: 5x week OT Frequency: 5x week            Contractures Contractures Info: Not present    Additional Factors Info  Code Status, Allergies Code Status Info: full Allergies Info: Nifedipine, Other, Penicillins           Current Medications (08/26/2024):  This is the current hospital active medication list Current Facility-Administered Medications  Medication Dose Route Frequency Provider Last Rate Last Admin   acetaminophen  (TYLENOL ) tablet 325-650 mg  325-650 mg Oral Q6H PRN Gawne, Meghan M, PA-C   650 mg at 08/26/24 0533   albuterol  (PROVENTIL ) (2.5 MG/3ML) 0.083% nebulizer solution 2.5 mg  2.5 mg Nebulization Q6H PRN Marten Peat M, RPH       alum & mag hydroxide-simeth (MAALOX/MYLANTA) 200-200-20 MG/5ML suspension 30 mL  30 mL Oral Q4H PRN Gawne, Meghan M, PA-C       apixaban  (ELIQUIS ) tablet 2.5 mg  2.5 mg Oral Q12H Gawne,  Meghan M, PA-C   2.5 mg at 08/26/24 9171   bisacodyl  (DULCOLAX) suppository 10 mg  10 mg Rectal Daily PRN Gawne, Meghan M, PA-C       Chlorhexidine  Gluconate Cloth 2 % PADS 6 each  6 each Topical Q0600 Geralynn Charleston, MD   6 each at 08/26/24 0534   cyanocobalamin  (VITAMIN B12) tablet 1,000 mcg  1,000 mcg Oral Daily Gawne, Meghan M, PA-C   1,000 mcg at 08/26/24 9171   diphenhydrAMINE  (BENADRYL ) 12.5 MG/5ML elixir 12.5-25 mg  12.5-25 mg Oral Q4H PRN Gawne, Meghan M, PA-C       docusate sodium  (COLACE) capsule 100 mg  100  mg Oral BID Gawne, Meghan M, PA-C   100 mg at 08/26/24 9171   gabapentin  (NEURONTIN ) capsule 100 mg  100 mg Oral QHS Gawne, Meghan M, PA-C   100 mg at 08/25/24 2250   HYDROmorphone  (DILAUDID ) injection 0.5-1 mg  0.5-1 mg Intravenous Q4H PRN Gawne, Meghan M, PA-C   0.5 mg at 08/25/24 1557   magnesium  citrate solution 1 Bottle  1 Bottle Oral Once PRN Gawne, Meghan M, PA-C       menthol -cetylpyridinium (CEPACOL) lozenge 3 mg  1 lozenge Oral PRN Gawne, Meghan M, PA-C       Or   phenol (CHLORASEPTIC) mouth spray 1 spray  1 spray Mouth/Throat PRN Gawne, Meghan M, PA-C       methocarbamol  (ROBAXIN ) tablet 500 mg  500 mg Oral Q6H PRN Gawne, Meghan M, PA-C   500 mg at 08/25/24 2029   Or   methocarbamol  (ROBAXIN ) injection 500 mg  500 mg Intravenous Q6H PRN Gawne, Meghan M, PA-C       metoCLOPramide  (REGLAN ) tablet 5-10 mg  5-10 mg Oral Q8H PRN Gawne, Meghan M, PA-C       Or   metoCLOPramide  (REGLAN ) injection 5-10 mg  5-10 mg Intravenous Q8H PRN Gawne, Meghan M, PA-C       multivitamin with minerals tablet 1 tablet  1 tablet Oral Daily Gawne, Meghan M, PA-C   1 tablet at 08/26/24 9171   ondansetron  (ZOFRAN ) tablet 4 mg  4 mg Oral Q6H PRN Gawne, Meghan M, PA-C       Or   ondansetron  (ZOFRAN ) injection 4 mg  4 mg Intravenous Q6H PRN Gawne, Meghan M, PA-C       oxyCODONE  (Oxy IR/ROXICODONE ) immediate release tablet 10 mg  10 mg Oral Q4H PRN Gawne, Meghan M, PA-C   10 mg at 08/26/24 1538   oxyCODONE  (Oxy IR/ROXICODONE ) immediate release tablet 5 mg  5 mg Oral Q4H PRN Gawne, Meghan M, PA-C   5 mg at 08/25/24 2029   pantoprazole  (PROTONIX ) EC tablet 40 mg  40 mg Oral Daily Gawne, Meghan M, PA-C   40 mg at 08/26/24 0828   polyethylene glycol (MIRALAX  / GLYCOLAX ) packet 17 g  17 g Oral Daily PRN Gawne, Meghan M, PA-C       sevelamer  carbonate (RENVELA ) tablet 1,600 mg  1,600 mg Oral TID WC Gawne, Meghan M, PA-C   1,600 mg at 08/26/24 1309     Discharge Medications: Please see discharge summary for a list of  discharge medications.  Relevant Imaging Results:  Relevant Lab Results:   Additional Information SS# [id number].   HD at [facility] on TTS 10:25 am chair time  Bridget Cordella [place], [place]

## 2024-08-26 NOTE — Progress Notes (Signed)
 Mobility Specialist Progress Note:    08/26/24 1403  Mobility  Activity Ambulated with assistance  Level of Assistance Moderate assist, patient does 50-74%  Assistive Device Front wheel walker  Distance Ambulated (ft) 8 ft  RLE Weight Bearing Per Provider Order WBAT  Activity Response Tolerated well  Mobility Referral Yes  Mobility visit 1 Mobility  Mobility Specialist Start Time (ACUTE ONLY) 1202  Mobility Specialist Stop Time (ACUTE ONLY) 1219  Mobility Specialist Time Calculation (min) (ACUTE ONLY) 17 min   Pt received in bed on CPM agreeable to mobility. +2 assistance required for removing CPM. Pt able to get to EOB w/ MinA. For STS from elevated surface pt required ModA. After talking a few steps pt requested to use BSC. Once finished pt was able to stand w/ no physical assistance. Ambulated to the chair. Left in chair w/ call bell and personal belongings in reach. All needs met.  Thersia Minder Mobility Specialist  Please contact vis Secure Chat or  Rehab Office [phone]

## 2024-08-26 NOTE — Progress Notes (Signed)
 [facility] Progress Note  Subjective:  Seen in room No new c/o  Vitals:   08/25/24 1941 08/26/24 0500 08/26/24 0528 08/26/24 0845  BP: (!) 133/47  (!) 120/48 (!) 98/50  Pulse: 85  95 90  Resp: 16  16 16   Temp: 99.5 F (37.5 C)  (!) 100.6 F (38.1 C) 99.5 F (37.5 C)  TempSrc: Oral  Oral Oral  SpO2: 100%  95% 96%  Weight:  90.8 kg    Height:        Exam: Gen alert, no distress No jvd or bruits Chest clear bilat to bases RRR no MRG Abd soft ntnd no mass or ascites +bs Ext no pitting LE  edema Neuro is alert, Ox 3 , nf    LUA AVF+bruit    Home bp meds: Lasix  80mg  three times per week      OP HD: [facility] TTS 4h  B400   89.3kg   2K bath   AVF   Heparin  4000 Last OP HD 9/01, post wt 89.3kg Recent Hb 11-12, no esa's       Assessment/ Plan: S/P R knee replacement surgery: 9/02, per orthopedics ESRD: on HD TTS. Had HD here yesterday. Next HD Sat.  BP: her BP's outpatient are low-normal. No midodrine, no bp lowering meds. BP's stable here Volume: close to dry wt, euvolemic on exam. Follow.  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 Follow.  Dispo: does not have support at home, TOC being consulted     Myer Fret MD  [facility] 08/26/2024, 12:59 PM  Recent Labs  Lab 08/23/24 2151 08/25/24 0923  HGB 9.8* 9.7*  ALBUMIN  3.4* 3.1*  CALCIUM  8.4* 7.9*  PHOS  --  4.4  CREATININE 5.79* 8.39*  K 4.4 3.9   No results for input(s): IRON, TIBC, FERRITIN in the last 168 hours. Inpatient medications:  apixaban   2.5 mg Oral Q12H   Chlorhexidine  Gluconate Cloth  6 each Topical Q0600   cyanocobalamin   1,000 mcg Oral Daily   docusate sodium   100 mg Oral BID   gabapentin   100 mg Oral QHS   multivitamin with minerals  1 tablet Oral Daily   pantoprazole   40 mg Oral Daily   sevelamer  carbonate  1,600 mg Oral TID WC    acetaminophen , albuterol , alum & mag hydroxide-simeth, bisacodyl , diphenhydrAMINE , HYDROmorphone  (DILAUDID ) injection, magnesium  citrate,  menthol -cetylpyridinium **OR** phenol, methocarbamol  **OR** methocarbamol  (ROBAXIN ) injection, metoCLOPramide  **OR** metoCLOPramide  (REGLAN ) injection, ondansetron  **OR** ondansetron  (ZOFRAN ) IV, oxyCODONE , oxyCODONE , polyethylene glycol

## 2024-08-26 NOTE — Progress Notes (Signed)
 Orthopedic Tech Progress Note Patient Details:  Colleen Gray Oct 28, 1951 [id number]  CPM Right Knee CPM Right Knee: On Right Knee Flexion (Degrees): 65 Right Knee Extension (Degrees): 0  Post Interventions Patient Tolerated: Well Instructions Provided: Care of device   Ival Basquez L Dorothy Polhemus 08/26/2024, 11:15 PM

## 2024-08-27 MED ORDER — HEPARIN SODIUM (PORCINE) 1000 UNIT/ML DIALYSIS
2000.0000 [IU] | Freq: Once | INTRAMUSCULAR | Status: DC
Start: 2024-08-28 — End: 2024-08-27

## 2024-08-27 MED ORDER — PENTAFLUOROPROP-TETRAFLUOROETH EX AERO: 1.0000 | INHALATION_SPRAY | CUTANEOUS | Status: DC | PRN

## 2024-08-27 MED ORDER — HEPARIN SODIUM (PORCINE) 1000 UNIT/ML DIALYSIS: 1000.0000 [IU] | INTRAMUSCULAR | Status: DC | PRN

## 2024-08-27 MED ORDER — METHOCARBAMOL 750 MG PO TABS
750.0000 mg | ORAL_TABLET | Freq: Three times a day (TID) | ORAL | 0 refills | Status: AC | PRN
Start: 2024-08-27 — End: ?

## 2024-08-27 MED ORDER — NEPRO/CARBSTEADY PO LIQD: 237.0000 mL | ORAL | Status: DC | PRN

## 2024-08-27 MED ORDER — APIXABAN 2.5 MG PO TABS
2.5000 mg | ORAL_TABLET | Freq: Two times a day (BID) | ORAL | 0 refills | Status: AC
Start: 2024-08-27 — End: ?

## 2024-08-27 MED ORDER — OXYCODONE HCL 5 MG PO TABS: 5.0000 mg | ORAL_TABLET | Freq: Four times a day (QID) | ORAL | 0 refills | Status: AC | PRN

## 2024-08-27 MED ORDER — ACETAMINOPHEN 500 MG PO TABS
1000.0000 mg | ORAL_TABLET | Freq: Four times a day (QID) | ORAL | 0 refills | Status: AC | PRN
Start: 2024-08-27 — End: ?

## 2024-08-27 MED ORDER — ONDANSETRON 4 MG PO TBDP
4.0000 mg | ORAL_TABLET | Freq: Three times a day (TID) | ORAL | 0 refills | Status: AC | PRN
Start: 2024-08-27 — End: ?

## 2024-08-27 NOTE — Progress Notes (Signed)
 Called report to [facility] at [facility] SN[facility].

## 2024-08-27 NOTE — Progress Notes (Signed)
 PT Cancellation Note  Patient Details Name: Colleen Gray MRN: [id number] DOB: 1951/12/13   Cancelled Treatment:    Reason Eval/Treat Not Completed: (P) Patient at procedure or test/unavailable (pt at HD dept) Will continue efforts per PT plan of care as schedule permits   Colleen Gray Blue 08/27/2024, 9:56 AM

## 2024-08-27 NOTE — Progress Notes (Signed)
  Received patient in bed to unit.   Informed consent signed and in chart.    TX duration: 3.25     Transported by staff Hand-off given to patient's nurse. yes   Access used: Fistula  Access issues:   none Total UF removed: 1000  Medication(s) given: 0      Colleen Gray renie LPN Kidney Dialysis Unit

## 2024-08-27 NOTE — Progress Notes (Signed)
 Orthopaedic Trauma Service Progress Note Weekend Coverage   Patient ID: Colleen Gray MRN: [id number] DOB/AGE: [birth year] 73 y.o.  Subjective:  Had dialysis this am  A little tired but doing ok   No new issues  SNF can accept today    ROS As above  Today's  total administered Morphine  Milligram Equivalents: 15 Yesterday's total administered Morphine  Milligram Equivalents: 60  Objective:   VITALS:   Vitals:   08/27/24 1000 08/27/24 1030 08/27/24 1050 08/27/24 1149  BP: (!) 121/51 (!) 121/50 (!) 124/53 (!) 120/54  Pulse: 89 91 89 97  Resp: 16 19 17 17   Temp:   98.4 F (36.9 C) 99.4 F (37.4 C)  TempSrc:    Oral  SpO2: 99% 97% 100% 99%  Weight:      Height:        Estimated body mass index is 36.2 kg/m as calculated from the following:   Height as of this encounter: 5' 3 (1.6 m).   Weight as of this encounter: 92.7 kg.   Intake/Output      09/05 0701 09/06 0700 09/06 0701 09/07 0700   P.O. 600    Total Intake(mL/kg) 600 (6.5)    Other  1000   Total Output  1000   Net +600 -1000        Urine Occurrence 2 x    Stool Occurrence 0 x      LABS  No results found for this or any previous visit (from the past 24 hours).   PHYSICAL EXAM:   Gen: resting comfortably in bed, sitting up, eating lunch  Lungs: unlabored Ext:       Right Lower Extremity Dressing is clean, dry and intact  Extremity is warm  No DCT  Compartments are soft  No pain out of proportion with passive stretching of his toes or ankle  DPN, SPN, TN sensory functions are intact  EHL, FHL, lesser toe motor functions intact  Ankle flexion, extension, inversion eversion intact  + DP pulse  Assessment/Plan: 4 Days Post-Op   Principal Problem:   S/P total knee arthroplasty, right   Anti-infectives (From admission, onward)    Start     Dose/Rate Route Frequency Ordered Stop   08/23/24 2200  ceFAZolin   (ANCEF ) IVPB 2g/100 mL premix        2 g 200 mL/hr over 30 Minutes Intravenous Every 6 hours 08/23/24 2059 08/23/24 2254   08/23/24 1045  ceFAZolin  (ANCEF ) IVPB 2g/100 mL premix        2 g 200 mL/hr over 30 Minutes Intravenous On call to O.R. 08/23/24 1039 08/23/24 1400     . 3 Days Post-Op  S/P Procedure(s) (LRB): ARTHROPLASTY, KNEE, TOTAL, RIGHT (Right) by Dr. Evalene BIRCH. Beverley on 08/23/24   Principal Problem:   S/P total knee arthroplasty, right     Plan: Dialysis managed by nephrology   Advance diet Up with therapy, encouraged her to be doing exercises on her own as well when in bed or the chair Incentive Spirometry Elevate and Apply ice   Weightbearing: WBAT RLE Insicional and dressing care: Dressings left intact until follow-up and Reinforce dressings as needed Orthopedic device(s): CPM 4-6 hr per day  Showering: Keep dressing dry VTE prophylaxis: Eliquis  2.5mg  bid x 30 days , SCDs, ambulation Pain control: continue with  current regimen    Physical paper prescriptions on chart  Follow - up plan: 2 weeks Contact information:  Evalene Chancy MD, Gerard Large PA-C   Dispo: SNF today   Stable for dc to SNF     Francis MICAEL Mt, PA-C [phone] (C) 08/27/2024, 12:42 PM  [facility] [address] [place] [address] [phone] (F)    After 5pm and on the weekends please log on to Amion, go to orthopaedics and the look under the [facility] Call for the provider(s) on call. You can also call our office at [phone] and then follow the prompts to be connected to the call team.  Patient ID: Colleen Gray, female   DOB: 1951-12-07, 73 y.o.   MRN: [id number]

## 2024-08-27 NOTE — Discharge Summary (Cosign Needed Addendum)
 Physician Discharge Summary  Patient ID: Colleen Gray MRN: [id number] DOB/AGE: 72-Jun-1952 72 y.o.  Addendum: Initially patient had plan to discharge home after her total knee replacement when discussing this in the office with Dr. Beverley and the surgical team.  Unfortunately her mobility has been severely limited.  CPM had been delivered at home in anticipation of her discharging home.  At this time CPM is not necessary to allow for patient to discharge to skilled nursing facility.  I have discussed with the surgical team and they will follow-up with the home health agency to figure out how to get the patient a CPM  Patient can discharge to day, 08/27/2024, without CPM  Francis MICAEL Mt, PA-C [phone] (C) 08/27/2024, 1:31 PM   Admit date: 08/23/2024 Discharge date: 08/27/2024  Admission Diagnoses:  Right knee osteoarthritis  Discharge Diagnoses:  Principal Problem:   S/P total knee arthroplasty, right   Past Medical History:  Diagnosis Date   Anemia    Arthritis    Asthma    Bradycardia    Chronic renal failure, stage 4 (severe) (HCC) 2017   Complication of anesthesia    during anesthesia heart rate is low    Diabetes mellitus without complication (HCC)    Dysrhythmia    RBBB   Enlarged heart    Sees Dr. Ladona   ESRD on hemodialysis [facility])    Tues, Thurs, Sat - Dr. Jerrye   Heart murmur    due to rheumatic fever - Sees Dr Ladona   Hemorrhoid    History of kidney stones    History of rheumatic fever    with a heart murmer   Hypertension    Ovarian cyst 1985   removed   Pneumonia    Psoriasis    Sleep apnea    does not use CPAP (after weight loss no longer needed)    Surgeries: Procedure(s): ARTHROPLASTY, KNEE, TOTAL, RIGHT on 08/23/2024   Consultants (if any): Treatment Team:  Geralynn Charleston, MD  Discharged Condition: Improved  Hospital Course: Colleen Gray is an 73 y.o. female who was admitted 08/23/2024 with a diagnosis of S/P total knee  arthroplasty, right and went to the operating room on 08/23/2024 and underwent the above named procedures.    She was given perioperative antibiotics:  Anti-infectives (From admission, onward)    Start     Dose/Rate Route Frequency Ordered Stop   08/23/24 2200  ceFAZolin  (ANCEF ) IVPB 2g/100 mL premix        2 g 200 mL/hr over 30 Minutes Intravenous Every 6 hours 08/23/24 2059 08/23/24 2254   08/23/24 1045  ceFAZolin  (ANCEF ) IVPB 2g/100 mL premix        2 g 200 mL/hr over 30 Minutes Intravenous On call to O.R. 08/23/24 1039 08/23/24 1400     .  She was given sequential compression devices, early ambulation, and Eliquis  for DVT prophylaxis.  She benefited maximally from the hospital stay and there were no complications.    Recent vital signs:  Vitals:   08/27/24 1050 08/27/24 1149  BP: (!) 124/53 (!) 120/54  Pulse: 89 97  Resp: 17 17  Temp: 98.4 F (36.9 C) 99.4 F (37.4 C)  SpO2: 100% 99%    Recent laboratory studies:  Lab Results  Component Value Date   HGB 9.7 (L) 08/25/2024   HGB 9.8 (L) 08/23/2024   HGB 10.9 (L) 08/23/2024   Lab Results  Component Value Date   WBC 8.3 08/25/2024   PLT 192  08/25/2024   Lab Results  Component Value Date   INR 0.98 10/13/2016   Lab Results  Component Value Date   NA 138 08/25/2024   K 3.9 08/25/2024   CL 97 (L) 08/25/2024   CO2 24 08/25/2024   BUN 53 (H) 08/25/2024   CREATININE 8.39 (H) 08/25/2024   GLUCOSE 162 (H) 08/25/2024    Discharge Medications:   Allergies as of 08/27/2024       Reactions   Nifedipine Swelling   Other    Surgery Glue caused blisters   Penicillins Rash        Medication List     TAKE these medications    acetaminophen  500 MG tablet Commonly known as: TYLENOL  Take 2 tablets (1,000 mg total) by mouth every 6 (six) hours as needed for mild pain (pain score 1-3) or moderate pain (pain score 4-6).   albuterol  108 (90 Base) MCG/ACT inhaler Commonly known as: VENTOLIN  HFA Inhale 1-2 puffs  into the lungs every 6 (six) hours as needed for wheezing or shortness of breath.   apixaban  2.5 MG Tabs tablet Commonly known as: Eliquis  Take 1 tablet (2.5 mg total) by mouth 2 (two) times daily. To prevent blood clots after surgery   ascorbic acid 500 MG tablet Commonly known as: VITAMIN C  Take 500 mg by mouth daily.   calcitRIOL  0.5 MCG capsule Commonly known as: ROCALTROL  Take 1 mcg by mouth Every Tuesday,Thursday,and Saturday with dialysis.   cyanocobalamin  1000 MCG tablet Commonly known as: VITAMIN B12 Take 1,000 mcg by mouth daily.   ferrous sulfate  325 (65 FE) MG tablet Take 325 mg by mouth every Monday, Wednesday, and Friday.   furosemide  80 MG tablet Commonly known as: Lasix  Take 1 tablet (80 mg total) by mouth 3 (three) times a week.   gabapentin  100 MG capsule Commonly known as: NEURONTIN  Take 100 mg by mouth at bedtime.   GlucoCom Blood Glucose Monitor Devi Use daily to check blood sugar.   methocarbamol  750 MG tablet Commonly known as: ROBAXIN  Take 1 tablet (750 mg total) by mouth every 8 (eight) hours as needed for muscle spasms.   multivitamin with minerals Tabs tablet Take 1 tablet by mouth daily.   nitroGLYCERIN  0.4 MG SL tablet Commonly known as: NITROSTAT  Place 1 tablet (0.4 mg total) under the tongue every 5 (five) minutes as needed for chest pain.   Omega 3 1000 MG Caps Take 1,000 mg by mouth daily.   ondansetron  4 MG disintegrating tablet Commonly known as: ZOFRAN -ODT Take 1 tablet (4 mg total) by mouth every 8 (eight) hours as needed for nausea or vomiting.   oxyCODONE  5 MG immediate release tablet Commonly known as: Roxicodone  Take 1 tablet (5 mg total) by mouth every 6 (six) hours as needed for severe pain (pain score 7-10). in right knee after surgery   sevelamer  carbonate 800 MG tablet Commonly known as: RENVELA  Take 1,600 mg by mouth 3 (three) times daily with meals.               Discharge Care Instructions  (From  admission, onward)           Start     Ordered   08/27/24 0000  Weight bearing as tolerated        08/27/24 1229            Diagnostic Studies: DG Knee Right Port Result Date: 08/23/2024 CLINICAL DATA:  Status post knee arthroplasty. EXAM: PORTABLE RIGHT KNEE - 1-2 VIEW COMPARISON:  None Available. FINDINGS: Right knee arthroplasty in expected alignment. No periprosthetic lucency or fracture. There has been patellar resurfacing. Recent postsurgical change includes air and edema in the soft tissues and joint space. IMPRESSION: Right knee arthroplasty without immediate postoperative complication. Electronically Signed   By: Andrea Gasman M.D.   On: 08/23/2024 16:55    Disposition: Discharge disposition: 03-Skilled Nursing Facility       Discharge Instructions     CPM   Complete by: As directed    Continuous passive motion machine (CPM):      Use the CPM from 0 to 90 for 4-6 hours per day.      You may break it up into 2 or 3 sessions per day.      Use CPM for 3 weeks or until you are told to stop.   Call MD / Call 911   Complete by: As directed    If you experience chest pain or shortness of breath, CALL 911 and be transported to the hospital emergency room.  If you develope a fever above 101 F, pus (white drainage) or increased drainage or redness at the wound, or calf pain, call your surgeon's office.   Diet - low sodium heart healthy   Complete by: As directed    Discharge instructions   Complete by: As directed    You may bear weight as tolerated. Keep your dressing on and dry until follow up. Take medicine to prevent blood clots as directed. Take pain medicine as needed with the goal of transitioning to over the counter medicines. It is okay to take 2 tablets of your narcotic medicine instead of just 1 tablet in the first 1-2 days after surgery when pain is at the worst. Do not continue to do this for multiple days at a time beyond that though.    INSTRUCTIONS  AFTER JOINT REPLACEMENT   Remove items at home which could result in a fall. This includes throw rugs or furniture in walking pathways ICE to the affected joint every three hours while awake for 30 minutes at a time, for at least the first 3-5 days, and then as needed for pain and swelling.  Continue to use ice for pain and swelling. You may notice swelling that will progress down to the foot and ankle.  This is normal after surgery.  Elevate your leg when you are not up walking on it.   Continue to use the breathing machine you got in the hospital (incentive spirometer) which will help keep your temperature down.  It is common for your temperature to cycle up and down following surgery, especially at night when you are not up moving around and exerting yourself.  The breathing machine keeps your lungs expanded and your temperature down.   DIET:  As you were doing prior to hospitalization, we recommend a well-balanced diet.  DRESSING / WOUND CARE / SHOWERING  You may shower 3 days after surgery, but keep the wounds dry during showering.  You may use an occlusive plastic wrap (Press'n Seal for example) with blue painter's tape at edges, NO SOAKING/SUBMERGING IN THE BATHTUB.  If the bandage gets wet, call the office.   ACTIVITY  Increase activity slowly as tolerated, but follow the weight bearing instructions below.   No driving for 6 weeks or until further direction given by your physician.  You cannot drive while taking narcotics.  No lifting or carrying greater than 10 lbs. until further directed by your surgeon. Avoid periods  of inactivity such as sitting longer than an hour when not asleep. This helps prevent blood clots.  You may return to work once you are authorized by your doctor.    WEIGHT BEARING   Weight bearing as tolerated with assist device (walker, cane, etc) as directed, use it as long as suggested by your surgeon or therapist, typically at least 4-6  weeks.   EXERCISES  Results after joint replacement surgery are often greatly improved when you follow the exercise, range of motion and muscle strengthening exercises prescribed by your doctor. Safety measures are also important to protect the joint from further injury. Any time any of these exercises cause you to have increased pain or swelling, decrease what you are doing until you are comfortable again and then slowly increase them. If you have problems or questions, call your caregiver or physical therapist for advice.   Rehabilitation is important following a joint replacement. After just a few days of immobilization, the muscles of the leg can become weakened and shrink (atrophy).  These exercises are designed to build up the tone and strength of the thigh and leg muscles and to improve motion. Often times heat used for twenty to thirty minutes before working out will loosen up your tissues and help with improving the range of motion but do not use heat for the first two weeks following surgery (sometimes heat can increase post-operative swelling).   These exercises can be done on a training (exercise) mat, on the floor, on a table or on a bed. Use whatever works the best and is most comfortable for you.    Use music or television while you are exercising so that the exercises are a pleasant break in your day. This will make your life better with the exercises acting as a break in your routine that you can look forward to.   Perform all exercises about fifteen times, three times per day or as directed.  You should exercise both the operative leg and the other leg as well.  Exercises include:   Quad Sets - Tighten up the muscle on the front of the thigh (Quad) and hold for 5-10 seconds.   Straight Leg Raises - With your knee straight (if you were given a brace, keep it on), lift the leg to 60 degrees, hold for 3 seconds, and slowly lower the leg.  Perform this exercise against resistance later as  your leg gets stronger.  Leg Slides: Lying on your back, slowly slide your foot toward your buttocks, bending your knee up off the floor (only go as far as is comfortable). Then slowly slide your foot back down until your leg is flat on the floor again.  Angel Wings: Lying on your back spread your legs to the side as far apart as you can without causing discomfort.  Hamstring Strength:  Lying on your back, push your heel against the floor with your leg straight by tightening up the muscles of your buttocks.  Repeat, but this time bend your knee to a comfortable angle, and push your heel against the floor.  You may put a pillow under the heel to make it more comfortable if necessary.   A rehabilitation program following joint replacement surgery can speed recovery and prevent re-injury in the future due to weakened muscles. Contact your doctor or a physical therapist for more information on knee rehabilitation.    CONSTIPATION  Constipation is defined medically as fewer than three stools per week and severe constipation  as less than one stool per week.  Even if you have a regular bowel pattern at home, your normal regimen is likely to be disrupted due to multiple reasons following surgery.  Combination of anesthesia, postoperative narcotics, change in appetite and fluid intake all can affect your bowels.   YOU MUST use at least one of the following options; they are listed in order of increasing strength to get the job done.  They are all available over the counter, and you may need to use some, POSSIBLY even all of these options:    Drink plenty of fluids (prune juice may be helpful) and high fiber foods Colace 100 mg by mouth twice a day  Senokot for constipation as directed and as needed Dulcolax (bisacodyl ), take with full glass of water   Miralax  (polyethylene glycol) once or twice a day as needed.  If you have tried all these things and are unable to have a bowel movement in the first 3-4 days  after surgery call either your surgeon or your primary doctor.    If you experience loose stools or diarrhea, hold the medications until you stool forms back up.  If your symptoms do not get better within 1 week or if they get worse, check with your doctor.  If you experience the worst abdominal pain ever or develop nausea or vomiting, please contact the office immediately for further recommendations for treatment.   ITCHING:  If you experience itching with your medications, try taking only a single pain pill, or even half a pain pill at a time.  You can also use Benadryl  over the counter for itching or also to help with sleep.   TED HOSE STOCKINGS:  Use stockings on both legs until for at least 2 weeks or as directed by physician office. They may be removed at night for sleeping.  MEDICATIONS:  See your medication summary on the After Visit Summary that nursing will review with you.  You may have some home medications which will be placed on hold until you complete the course of blood thinner medication.  It is important for you to complete the blood thinner medication as prescribed.  Take medicines as prescribed.   You have several different medicines that work in different ways. - Tylenol  is for mild to moderate pain. Try to take this medicine before turning to your narcotic medicines.  - Robaxin  is for muscle spasms. This medicine can make you drowsy. - Oxycodone  is a narcotic pain medicine.  Take this for severe pain. This medicine can be dehydrating / constipating. - Zofran  is for nausea and vomiting. - Colace is for constipation prevention while taking pain medicine.  - Eliquis  is to prevent blood clots after surgery. YOU MUST TAKE THIS MEDICINE!!  PRECAUTIONS:  If you experience chest pain or shortness of breath - call 911 immediately for transfer to the hospital emergency department.   If you develop a fever greater that 101 F, purulent drainage from wound, increased redness or  drainage from wound, foul odor from the wound/dressing, or calf pain - CONTACT YOUR SURGEON.                                                   FOLLOW-UP APPOINTMENTS:  If you do not already have a post-op appointment, please call the office [phone] for an appointment to  be seen by Dr. Beverley in 2 weeks.   OTHER INSTRUCTIONS:   MAKE SURE YOU:  Understand these instructions.  Get help right away if you are not doing well or get worse.    Thank you for letting us  be a part of your medical care team.  It is a privilege we respect greatly.  We hope these instructions will help you stay on track for a fast and full recovery!   Do not put a pillow under the knee. Place it under the heel.   Complete by: As directed    Post-operative opioid taper instructions:   Complete by: As directed    POST-OPERATIVE OPIOID TAPER INSTRUCTIONS: It is important to wean off of your opioid medication as soon as possible. If you do not need pain medication after your surgery it is ok to stop day one. Opioids include: Codeine, Hydrocodone (Norco, Vicodin), Oxycodone (Percocet, oxycontin ) and hydromorphone  amongst others.  Long term and even short term use of opiods can cause: Increased pain response Dependence Constipation Depression Respiratory depression And more.  Withdrawal symptoms can include Flu like symptoms Nausea, vomiting And more Techniques to manage these symptoms Hydrate well Eat regular healthy meals Stay active Use relaxation techniques(deep breathing, meditating, yoga) Do Not substitute Alcohol to help with tapering If you have been on opioids for less than two weeks and do not have pain than it is ok to stop all together.  Plan to wean off of opioids This plan should start within one week post op of your joint replacement. Maintain the same interval or time between taking each dose and first decrease the dose.  Cut the total daily intake of opioids by one tablet each day Next start  to increase the time between doses. The last dose that should be eliminated is the evening dose.      TED hose   Complete by: As directed    Use stockings (TED hose) for 2 weeks on right leg(s).  You may remove them at night for sleeping.   Weight bearing as tolerated   Complete by: As directed         Follow-up Information     Beverley Evalene BIRCH, MD. Go on 09/07/2024.   Specialty: Orthopedic Surgery Why: Your appointment is at [facility] information: [address] [place] [address] [phone]         [facility]. Go on 08/30/2024.   Why: Schedule is Tuesday, Thursday, Saturday with 10:25 am chair time. Contact information: [address] [place] [address] [phone]                  Signed: Francis LELON Mt PA-C Office [phone] 08/27/2024, 12:29 PM

## 2024-08-27 NOTE — TOC Progression Note (Addendum)
 Transition of Care [facility]) - Progression Note    Patient Details  Name: Colleen Gray MRN: [id number] Date of Birth: 04-Oct-1951  Transition of Care [facility]) CM/SW Contact  [name], LCSWA Phone Number: 08/27/2024, 12:59 PM  Clinical Narrative:      CSW spoke with Shona with [facility] who needs to be able to get patients CPM machine from patients home to facility. CSW informed medical team. CSW will continue to follow.  Update-  Rhonda with [facility] said that she spoke with patient and patient informed Shona that she does not have a key to her home that her aunt does. Rhonda with [facility] is willing to meet patients aunt tomorrow at patients home to pick up CPM machine. Patient informed Shona that she has been unable to get in touch with her aunt. CSW and Shona with [facility] unable to get in touch with patients aunt. Rhonda with [facility] confirmed patient will deff be unable to dc over today to facility since unable to get machine for patient today. Shona is hoping we can get in touch with aunt to coordinate picking up machine tomorrow so that patient can dc over to facility tomorrow. Patients aunt called rhonda back and they are coordinating.  Update-PA informed CSW that machine not necessary. Rhonda request for that to be put in dc summary and patient can dc over today. PA confirmed will put in dc summary. CSW informed Shona. Shona confirmed patient can dc over today.                   Expected Discharge Plan and Services         Expected Discharge Date: 08/27/24                                     Social Drivers of Health (SDOH) Interventions SDOH Screenings   Food Insecurity: Patient Declined (08/24/2024)  Housing: Patient Declined (08/24/2024)  Transportation Needs: Patient Declined (08/24/2024)  Utilities: Patient Declined (08/24/2024)  Financial Resource Strain: Low Risk  (01/22/2022)   Received from [facility]  Physical Activity:  Unknown (01/22/2022)   Received from [facility], [facility] visits prior to 02/21/2023.  Social Connections: Unknown (08/24/2024)  Stress: No Stress Concern Present (01/22/2022)   Received from [facility]  Tobacco Use: Low Risk  (08/23/2024)    Readmission Risk Interventions     No data to display

## 2024-08-27 NOTE — TOC Transition Note (Addendum)
 Transition of Care [facility]) - Discharge Note   Patient Details  Name: Colleen Gray MRN: [id number] Date of Birth: August 22, 1951  [facility] CM/SW Contact:  [name], LCSWA Phone Number: 08/27/2024, 1:39 PM   Clinical Narrative:     Patient will DC to: [facility]  Anticipated DC date: 08/27/2024  Family notified: [name]  Transport by: [facility]  ?  Per MD patient ready for DC to [facility] . RN, patient, patient's family, Randine renal navigator,and facility notified of DC. Discharge Summary sent to facility. RN given number for report [phone] RM# [phone]. DC packet on chart. Ambulance transport requested for patient.  CSW signing off.    Final next level of care: Skilled Nursing Facility Barriers to Discharge: No Barriers Identified   Patient Goals and CMS Choice Patient states their goals for this hospitalization and ongoing recovery are:: SNF   Choice offered to / list presented to : Patient      Discharge Placement              Patient chooses bed at: [facility] Patient to be transferred to facility by: [facility] Name of family member notified: [name] Patient and family notified of of transfer: 08/27/24  Discharge Plan and Services Additional resources added to the After Visit Summary for                                       Social Drivers of Health (SDOH) Interventions SDOH Screenings   Food Insecurity: Patient Declined (08/24/2024)  Housing: Patient Declined (08/24/2024)  Transportation Needs: Patient Declined (08/24/2024)  Utilities: Patient Declined (08/24/2024)  Financial Resource Strain: Low Risk  (01/22/2022)   Received from [facility]  Physical Activity: Unknown (01/22/2022)   Received from [facility], [facility] visits prior to 02/21/2023.  Social Connections: Unknown (08/24/2024)  Stress: No Stress Concern Present (01/22/2022)   Received from [facility]  Tobacco Use: Low Risk   (08/23/2024)     Readmission Risk Interventions     No data to display

## 2024-08-27 NOTE — Progress Notes (Signed)
 [facility] Progress Note  Subjective:  Seen in HD unit No new c/o  Vitals:   08/27/24 0930 08/27/24 1000 08/27/24 1030 08/27/24 1050  BP: 124/65 (!) 121/51 (!) 121/50 (!) 124/53  Pulse: 88 89 91 89  Resp: 15 16 19 17   Temp:    98.4 F (36.9 C)  TempSrc:      SpO2: 100% 99% 97% 100%  Weight:      Height:        Exam: Gen alert, no distress No jvd or bruits Chest clear bilat to bases RRR no MRG Abd soft ntnd no mass or ascites +bs Ext no pitting LE  edema Neuro is alert, Ox 3 , nf    LUA AVF+bruit    Home bp meds: Lasix  80mg  three times per week      OP HD: [facility] TTS 4h  B400   89.3kg   2K bath   AVF   Heparin  4000 Last OP HD 9/01, post wt 89.3kg Recent Hb 11-12, no esa's       Assessment/ Plan: S/P R knee replacement surgery: 9/02, per orthopedics ESRD: on HD TTS. HD today.  BP: her BP's outpatient are low-normal. No midodrine, no bp lowering meds. BP's stable here Volume: close to dry wt, euvolemic on exam. Follow.  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 Follow.  Dispo: does not have support at home, TOC being consulted     Myer Fret MD  [facility] 08/27/2024, 11:38 AM  Recent Labs  Lab 08/23/24 2151 08/25/24 0923  HGB 9.8* 9.7*  ALBUMIN  3.4* 3.1*  CALCIUM  8.4* 7.9*  PHOS  --  4.4  CREATININE 5.79* 8.39*  K 4.4 3.9   No results for input(s): IRON, TIBC, FERRITIN in the last 168 hours. Inpatient medications:  apixaban   2.5 mg Oral Q12H   Chlorhexidine  Gluconate Cloth  6 each Topical Q0600   cyanocobalamin   1,000 mcg Oral Daily   docusate sodium   100 mg Oral BID   gabapentin   100 mg Oral QHS   multivitamin with minerals  1 tablet Oral Daily   pantoprazole   40 mg Oral Daily   sevelamer  carbonate  1,600 mg Oral TID WC    acetaminophen , albuterol , alum & mag hydroxide-simeth, bisacodyl , diphenhydrAMINE , HYDROmorphone  (DILAUDID ) injection, magnesium  citrate, menthol -cetylpyridinium **OR** phenol, methocarbamol  **OR** methocarbamol   (ROBAXIN ) injection, metoCLOPramide  **OR** metoCLOPramide  (REGLAN ) injection, ondansetron  **OR** ondansetron  (ZOFRAN ) IV, oxyCODONE , oxyCODONE , polyethylene glycol

## 2024-08-28 NOTE — Discharge Planning (Signed)
 [facility] Patient Discharge Orders- [facility] CL[facility]: [place]-DC to SNF  Patient's name: Colleen Gray Admit/DC Dates: 08/23/2024 - 08/27/2024  Discharge Diagnoses: Right knee osteoarthritis. S/p right total knee replacement 9/2    Aranesp: Given: No     Last Hgb: 9.7 PRBC's Given: No ESA dose for discharge: Start mircera per protocol IV Iron dose at discharge: Resume Fe load if not already completed  Heparin  change: No  EDW Change: No  Bath Change: No  Access intervention/Change: No  Calcitriol  change: No  Discharge Labs: Calcium  7.9  Phosphorus 4.4  Albumin  3.1  K+ 3.9  IV Antibiotics: No  On Coumadin?: No. On Eliquis      D/C Meds to be reconciled by nurse after every discharge.  Completed By: Charmaine Piety, NP   Reviewed by: MD:______ RN_______

## 2024-08-29 NOTE — Progress Notes (Signed)
 Late Note Entry- August 29, 2024  Pt was d/c to snf on Saturday. Contacted [facility] to be advised of pt's d/c date and that pt should resume care tomorrow. Clinic provided snf name.   Randine Mungo Dialysis Navigator [phone]

## 2024-08-30 NOTE — Telephone Encounter (Signed)
 Called and left second message for patient to contact office to schedule appointment- cancelling appointment at this time as provider is out of office.

## 2024-09-12 ENCOUNTER — Ambulatory Visit: Admitting: Podiatry

## 2024-09-30 ENCOUNTER — Ambulatory Visit: Admitting: Cardiology

## 2024-11-07 ENCOUNTER — Ambulatory Visit: Admitting: Podiatry

## 2024-11-07 VITALS — Ht 63.0 in | Wt 204.0 lb

## 2024-11-07 DIAGNOSIS — B351 Tinea unguium: Secondary | ICD-10-CM

## 2024-11-07 DIAGNOSIS — M79674 Pain in right toe(s): Secondary | ICD-10-CM

## 2024-11-07 DIAGNOSIS — E1142 Type 2 diabetes mellitus with diabetic polyneuropathy: Secondary | ICD-10-CM

## 2024-11-07 DIAGNOSIS — M79675 Pain in left toe(s): Secondary | ICD-10-CM

## 2024-11-07 DIAGNOSIS — L84 Corns and callosities: Secondary | ICD-10-CM

## 2024-11-07 NOTE — Patient Instructions (Signed)
 Look for a UREA based moisturizer for the heel calluses

## 2024-11-07 NOTE — Progress Notes (Signed)
  Subjective:  Patient ID: Colleen Gray, female    DOB: [birth year],  MRN: [id number]  Chief Complaint  Patient presents with   Callouses     RM 13 Patient is here for callous trim/ toenails cut.   History of Present Illness Colleen Gray is a 73 year old female with diabetes who presents for concerns of thick, elongated nails that she is not able to trim herself.  No swelling redness or any drainage.  No open lesions.  She has not been able to get the new diabetic shoes yet due to her other appointments, surgery.    She has previously been diagnosed with neuropathy.  Last A1c 5.8 on 08/15/2024   Objective:    Physical Exam General: AAO x3, NAD  Dermatological: Nails are hypertrophic, dystrophic, brittle, discolored, elongated 10. No surrounding redness or drainage. Tenderness nails 1-5 bilaterally.  Thick hyperkeratotic lesions to the plantar aspects of bilateral heels without any underlying ulceration, drainage or signs of infection.   Vascular: Dorsalis Pedis artery and Posterior Tibial artery pedal pulses are palpable bilateral with immedate capillary fill time. There is no pain with calf compression, swelling, warmth, erythema.   Neruologic: Sensation decreased with [employer] Speed monofilament  Musculoskeletal: Bunion, hammertoes are present.  Gait: Unassisted, Nonantalgic.     Results    Assessment:     Callus    Type II diabetes mellitus with polyneuropathy  3.      Symptomatic Onychomycosis     Plan:  Patient was evaluated and treated and all questions answered.  Assessment and Plan Assessment & Plan Symptomatic Onychomycosis - Nails sharply debrided x 10 without any complications or bleeding.  Hyperkeratotic lesions -Sharply debrided x 2 without any complications or bleeding.  Return in about 3 months (around 02/07/2025).  [name] Fees DPM

## 2025-02-06 ENCOUNTER — Ambulatory Visit: Admitting: Podiatry
